# Patient Record
Sex: Male | Born: 1975
Health system: Southern US, Community
[De-identification: ages and names within clinical notes are randomized; demographics above are authoritative.]

## PROBLEM LIST (undated history)

## (undated) DIAGNOSIS — F329 Major depressive disorder, single episode, unspecified: Secondary | ICD-10-CM

## (undated) DIAGNOSIS — L4050 Arthropathic psoriasis, unspecified: Secondary | ICD-10-CM

## (undated) DIAGNOSIS — F909 Attention-deficit hyperactivity disorder, unspecified type: Secondary | ICD-10-CM

## (undated) DIAGNOSIS — S060X9A Concussion with loss of consciousness of unspecified duration, initial encounter: Secondary | ICD-10-CM

## (undated) DIAGNOSIS — F4310 Post-traumatic stress disorder, unspecified: Secondary | ICD-10-CM

## (undated) DIAGNOSIS — T671XXA Heat syncope, initial encounter: Secondary | ICD-10-CM

## (undated) DIAGNOSIS — F32A Depression, unspecified: Secondary | ICD-10-CM

## (undated) DIAGNOSIS — F319 Bipolar disorder, unspecified: Secondary | ICD-10-CM

## (undated) DIAGNOSIS — K219 Gastro-esophageal reflux disease without esophagitis: Secondary | ICD-10-CM

## (undated) HISTORY — DX: Major depressive disorder, single episode, unspecified: F32.9

## (undated) HISTORY — DX: Bipolar disorder, unspecified: F31.9

## (undated) HISTORY — DX: Post-traumatic stress disorder, unspecified: F43.10

## (undated) HISTORY — DX: Attention-deficit hyperactivity disorder, unspecified type: F90.9

## (undated) HISTORY — DX: Depression, unspecified: F32.A

## (undated) HISTORY — DX: Concussion with loss of consciousness of unspecified duration, initial encounter: S06.0X9A

## (undated) HISTORY — DX: Heat syncope, initial encounter: T67.1XXA

## (undated) HISTORY — DX: Gastro-esophageal reflux disease without esophagitis: K21.9

## (undated) HISTORY — DX: Arthropathic psoriasis, unspecified: L40.50

---
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
## 2003-09-26 ENCOUNTER — Ambulatory Visit (HOSPITAL_COMMUNITY): Admission: RE | Admit: 2003-09-26 | Discharge: 2003-09-26 | Payer: Self-pay | Admitting: Internal Medicine

## 2012-08-12 ENCOUNTER — Other Ambulatory Visit: Payer: Self-pay | Admitting: *Deleted

## 2012-08-12 ENCOUNTER — Other Ambulatory Visit: Payer: Self-pay | Admitting: Internal Medicine

## 2012-08-12 ENCOUNTER — Telehealth: Payer: Self-pay | Admitting: Internal Medicine

## 2012-08-12 ENCOUNTER — Other Ambulatory Visit (INDEPENDENT_AMBULATORY_CARE_PROVIDER_SITE_OTHER): Payer: Self-pay

## 2012-08-12 DIAGNOSIS — T887XXA Unspecified adverse effect of drug or medicament, initial encounter: Secondary | ICD-10-CM

## 2012-08-12 NOTE — Telephone Encounter (Signed)
Dr. Andee Poles requests liver function labs as part of medication monitoring for [facility]. Orders entered with result to be forwarded to Dr. Nolen Mu.

## 2012-08-15 ENCOUNTER — Encounter: Payer: Self-pay | Admitting: Internal Medicine

## 2012-09-07 ENCOUNTER — Other Ambulatory Visit (INDEPENDENT_AMBULATORY_CARE_PROVIDER_SITE_OTHER): Payer: Self-pay

## 2012-09-07 ENCOUNTER — Other Ambulatory Visit: Payer: Self-pay | Admitting: Internal Medicine

## 2012-09-07 DIAGNOSIS — T50995A Adverse effect of other drugs, medicaments and biological substances, initial encounter: Secondary | ICD-10-CM

## 2012-12-24 ENCOUNTER — Telehealth: Payer: Self-pay | Admitting: *Deleted

## 2012-12-24 ENCOUNTER — Other Ambulatory Visit: Payer: Self-pay | Admitting: *Deleted

## 2012-12-24 MED ORDER — ESOMEPRAZOLE MAGNESIUM 40 MG PO CPDR: 40.0000 mg | DELAYED_RELEASE_CAPSULE | Freq: Every day | ORAL | Status: AC

## 2012-12-24 NOTE — Telephone Encounter (Signed)
Pt's father requesting Rx for Nexium to give to pt for him to fill through mail order. Ok per Dr. Posey Rea. Signed Rx given to pt.

## 2013-01-11 ENCOUNTER — Other Ambulatory Visit: Payer: Self-pay | Admitting: Internal Medicine

## 2013-01-11 ENCOUNTER — Other Ambulatory Visit: Payer: Self-pay

## 2013-01-11 DIAGNOSIS — T887XXA Unspecified adverse effect of drug or medicament, initial encounter: Secondary | ICD-10-CM

## 2013-01-12 LAB — VALPROIC ACID LEVEL: Valproic Acid Lvl: 55.3 ug/mL (ref 50.0–100.0)

## 2013-01-17 ENCOUNTER — Encounter: Payer: Self-pay | Admitting: Internal Medicine

## 2014-01-16 ENCOUNTER — Encounter: Payer: Self-pay | Admitting: Internal Medicine

## 2015-07-29 HISTORY — PX: ANKLE SURGERY: SHX546

## 2015-09-25 ENCOUNTER — Other Ambulatory Visit (HOSPITAL_COMMUNITY): Payer: Self-pay | Admitting: Orthopedic Surgery

## 2015-09-25 DIAGNOSIS — M25571 Pain in right ankle and joints of right foot: Secondary | ICD-10-CM

## 2016-08-06 DIAGNOSIS — F3174 Bipolar disorder, in full remission, most recent episode manic: Secondary | ICD-10-CM | POA: Diagnosis not present

## 2016-08-06 DIAGNOSIS — F3176 Bipolar disorder, in full remission, most recent episode depressed: Secondary | ICD-10-CM | POA: Diagnosis not present

## 2016-09-10 DIAGNOSIS — F3176 Bipolar disorder, in full remission, most recent episode depressed: Secondary | ICD-10-CM | POA: Diagnosis not present

## 2016-09-10 DIAGNOSIS — F3174 Bipolar disorder, in full remission, most recent episode manic: Secondary | ICD-10-CM | POA: Diagnosis not present

## 2016-09-17 ENCOUNTER — Encounter: Payer: Self-pay | Admitting: Internal Medicine

## 2016-09-17 ENCOUNTER — Telehealth: Payer: Self-pay | Admitting: Emergency Medicine

## 2016-09-17 ENCOUNTER — Ambulatory Visit (INDEPENDENT_AMBULATORY_CARE_PROVIDER_SITE_OTHER)
Admission: RE | Admit: 2016-09-17 | Discharge: 2016-09-17 | Disposition: A | Discharge status: Home / Self Care | Payer: BLUE CROSS/BLUE SHIELD | Source: Ambulatory Visit | Attending: Internal Medicine | Admitting: Internal Medicine

## 2016-09-17 ENCOUNTER — Ambulatory Visit (INDEPENDENT_AMBULATORY_CARE_PROVIDER_SITE_OTHER): Payer: BLUE CROSS/BLUE SHIELD | Admitting: Internal Medicine

## 2016-09-17 VITALS — BP 156/92 | HR 98 | Temp 98.6°F | Resp 16 | Ht 69.0 in | Wt 181.5 lb

## 2016-09-17 DIAGNOSIS — K219 Gastro-esophageal reflux disease without esophagitis: Secondary | ICD-10-CM

## 2016-09-17 DIAGNOSIS — M542 Cervicalgia: Secondary | ICD-10-CM

## 2016-09-17 DIAGNOSIS — K222 Esophageal obstruction: Secondary | ICD-10-CM

## 2016-09-17 DIAGNOSIS — L4050 Arthropathic psoriasis, unspecified: Secondary | ICD-10-CM | POA: Diagnosis not present

## 2016-09-17 DIAGNOSIS — M545 Low back pain, unspecified: Secondary | ICD-10-CM | POA: Insufficient documentation

## 2016-09-17 DIAGNOSIS — M5440 Lumbago with sciatica, unspecified side: Secondary | ICD-10-CM

## 2016-09-17 DIAGNOSIS — L409 Psoriasis, unspecified: Secondary | ICD-10-CM

## 2016-09-17 MED ORDER — CELECOXIB 200 MG PO CAPS: 200.0000 mg | ORAL_CAPSULE | Freq: Two times a day (BID) | ORAL | 2 refills | Status: DC

## 2016-09-17 MED ORDER — CYCLOBENZAPRINE HCL 5 MG PO TABS: 5.0000 mg | ORAL_TABLET | Freq: Three times a day (TID) | ORAL | 1 refills | Status: DC | PRN

## 2016-09-17 MED ORDER — VITAMIN D 50 MCG (2000 UT) PO CAPS: ORAL_CAPSULE | ORAL | 3 refills | Status: AC

## 2016-09-17 NOTE — Assessment & Plan Note (Signed)
Scalp plaque

## 2016-09-17 NOTE — Assessment & Plan Note (Signed)
Recurrent sx's 

## 2016-09-17 NOTE — Progress Notes (Signed)
Pre-visit discussion using our clinic review tool. No additional management support is needed unless otherwise documented below in the visit note.  

## 2016-09-17 NOTE — Assessment & Plan Note (Addendum)
On Nexium F/u w/GI if needed Celebrex w/caution H pylori Ab

## 2016-09-17 NOTE — Telephone Encounter (Signed)
Ok to come today at 4:30 --appt made Thx

## 2016-09-17 NOTE — Assessment & Plan Note (Addendum)
Flexeril Celebrex w/caution Try TRE - Trauma Release Exercise  You can get a "Stress Less TRE" application X ray, labs Vit D

## 2016-09-17 NOTE — Telephone Encounter (Signed)
Pt called and wants to know if you will except him as a new patient. Please advise thanks.

## 2016-09-17 NOTE — Assessment & Plan Note (Signed)
MSK vs psoriatic arthritis Flexeril Celebrex w/caution Rice bag Try TRE - Trauma Release Exercise  You can get a "Stress Less TRE" application X ray Labs

## 2016-09-17 NOTE — Progress Notes (Signed)
Subjective:  Patient ID: Francisco Morgan, male    DOB: 07/25/1976  Age: 41 y.o. MRN: [id number]  CC: Annual Exam (Back pain, )   HPI Francisco Morgan presents for a severe LBP x 1 year off and on. On Sun - another attack has started. There is a lot of tension in the lower back and now has "spikes" in the neck x3 days. 2/10 at rest; 6/10 w/movement He took Celebrex today x1 - not better yet    Outpatient Medications Prior to Visit  Medication Sig Dispense Refill  . esomeprazole (NEXIUM) 40 MG capsule Take 1 capsule (40 mg total) by mouth daily before breakfast. 90 capsule 4   No facility-administered medications prior to visit.     ROS Review of Systems  Constitutional: Negative for appetite change, fatigue and unexpected weight change.  HENT: Negative for congestion, nosebleeds, sneezing, sore throat and trouble swallowing.   Eyes: Negative for itching and visual disturbance.  Respiratory: Negative for cough.   Cardiovascular: Negative for chest pain, palpitations and leg swelling.  Gastrointestinal: Negative for abdominal distention, blood in stool, diarrhea, nausea and vomiting.  Genitourinary: Negative for frequency and hematuria.  Musculoskeletal: Positive for arthralgias, back pain, joint swelling, neck pain and neck stiffness. Negative for gait problem.  Skin: Positive for rash.  Neurological: Negative for dizziness, tremors, speech difficulty and weakness.  Psychiatric/Behavioral: Positive for decreased concentration. Negative for agitation, dysphoric mood, sleep disturbance and suicidal ideas. The patient is not nervous/anxious.     Objective:  BP (!) 156/92   Pulse 98   Temp 98.6 F (37 C) (Oral)   Resp 16   Ht 5\' 9"  (1.753 m)   Wt 181 lb 8 oz (82.3 kg)   SpO2 99%   BMI 26.80 kg/m   BP Readings from Last 3 Encounters:  09/17/16 (!) 156/92    Wt Readings from Last 3 Encounters:  09/17/16 181 lb 8 oz (82.3 kg)    Physical Exam  Constitutional: He is  oriented to person, place, and time. He appears well-developed. No distress.  NAD  HENT:  Mouth/Throat: Oropharynx is clear and moist.  Eyes: Conjunctivae are normal. Pupils are equal, round, and reactive to light.  Neck: Normal range of motion. No JVD present. No thyromegaly present.  Cardiovascular: Normal rate, regular rhythm, normal heart sounds and intact distal pulses.  Exam reveals no gallop and no friction rub.   No murmur heard. Pulmonary/Chest: Effort normal and breath sounds normal. No respiratory distress. He has no wheezes. He has no rales. He exhibits no tenderness.  Abdominal: Soft. Bowel sounds are normal. He exhibits no distension and no mass. There is no tenderness. There is no rebound and no guarding.  Musculoskeletal: Normal range of motion. He exhibits tenderness. He exhibits no edema.  Lymphadenopathy:    He has no cervical adenopathy.  Neurological: He is alert and oriented to person, place, and time. He has normal reflexes. No cranial nerve deficit. He exhibits normal muscle tone. He displays a negative Romberg sign. Coordination and gait normal.  Skin: Skin is warm and dry. Rash noted.  Psychiatric: He has a normal mood and affect. His behavior is normal. Judgment and thought content normal.  very stiff LS spine w/decr ROM due to pain str leg elev (-) B very stiff cervical spine w/decr ROM due to pain Scalp w/psoriatic patches  Lab Results  Component Value Date   ALT 28 09/07/2012   AST 25 09/07/2012    No results  found.  Assessment & Plan:   There are no diagnoses linked to this encounter. I am having Mr. Latney maintain his esomeprazole, methylphenidate, CONCERTA, clonazePAM, divalproex, and lamoTRIgine.  Meds ordered this encounter  Medications  . methylphenidate (RITALIN) 10 MG tablet    Refill:  0  . CONCERTA 36 MG CR tablet    Refill:  0  . clonazePAM (KLONOPIN) 0.5 MG tablet    Refill:  2  . divalproex (DEPAKOTE) 500 MG DR tablet    Sig: Take  500 mg by mouth 3 (three) times daily.  Marland Kitchen lamoTRIgine (LAMICTAL) 200 MG tablet    Sig: Take 200 mg by mouth daily.     Follow-up: No Follow-up on file.  Walker Kehr, MD

## 2016-09-17 NOTE — Patient Instructions (Addendum)
Try TRE - Trauma Release Exercise  You can get a "Stress Less TRE" application Rice bag

## 2016-09-18 ENCOUNTER — Other Ambulatory Visit (INDEPENDENT_AMBULATORY_CARE_PROVIDER_SITE_OTHER): Payer: BLUE CROSS/BLUE SHIELD

## 2016-09-18 DIAGNOSIS — L409 Psoriasis, unspecified: Secondary | ICD-10-CM | POA: Diagnosis not present

## 2016-09-18 DIAGNOSIS — K219 Gastro-esophageal reflux disease without esophagitis: Secondary | ICD-10-CM | POA: Diagnosis not present

## 2016-09-18 DIAGNOSIS — K222 Esophageal obstruction: Secondary | ICD-10-CM

## 2016-09-18 DIAGNOSIS — M542 Cervicalgia: Secondary | ICD-10-CM | POA: Diagnosis not present

## 2016-09-18 DIAGNOSIS — L4050 Arthropathic psoriasis, unspecified: Secondary | ICD-10-CM

## 2016-09-18 LAB — VITAMIN B12: Vitamin B-12: 296 pg/mL (ref 211–911)

## 2016-09-18 LAB — CBC WITH DIFFERENTIAL/PLATELET
BASOS ABS: 0 10*3/uL (ref 0.0–0.1)
Basophils Relative: 0.4 % (ref 0.0–3.0)
EOS ABS: 0.1 10*3/uL (ref 0.0–0.7)
Eosinophils Relative: 0.9 % (ref 0.0–5.0)
HEMATOCRIT: 43.1 % (ref 39.0–52.0)
HEMOGLOBIN: 15.1 g/dL (ref 13.0–17.0)
Lymphocytes Relative: 34 % (ref 12.0–46.0)
Lymphs Abs: 2.3 10*3/uL (ref 0.7–4.0)
MCHC: 35.2 g/dL (ref 30.0–36.0)
MCV: 92.8 fl (ref 78.0–100.0)
Monocytes Absolute: 0.4 10*3/uL (ref 0.1–1.0)
Monocytes Relative: 5.5 % (ref 3.0–12.0)
Neutro Abs: 4 10*3/uL (ref 1.4–7.7)
Neutrophils Relative %: 59.2 % (ref 43.0–77.0)
Platelets: 239 10*3/uL (ref 150.0–400.0)
RBC: 4.64 Mil/uL (ref 4.22–5.81)
RDW: 12.6 % (ref 11.5–15.5)
WBC: 6.7 10*3/uL (ref 4.0–10.5)

## 2016-09-18 LAB — URINALYSIS
HGB URINE DIPSTICK: NEGATIVE
Ketones, ur: 40 — AB
Leukocytes, UA: NEGATIVE
NITRITE: NEGATIVE
PH: 7 (ref 5.0–8.0)
Specific Gravity, Urine: 1.02 (ref 1.000–1.030)
Urine Glucose: NEGATIVE
Urobilinogen, UA: 1 (ref 0.0–1.0)

## 2016-09-18 LAB — HEPATIC FUNCTION PANEL
ALK PHOS: 50 U/L (ref 39–117)
ALT: 27 U/L (ref 0–53)
AST: 27 U/L (ref 0–37)
Albumin: 4.8 g/dL (ref 3.5–5.2)
BILIRUBIN DIRECT: 0.2 mg/dL (ref 0.0–0.3)
Total Bilirubin: 0.9 mg/dL (ref 0.2–1.2)
Total Protein: 7.2 g/dL (ref 6.0–8.3)

## 2016-09-18 LAB — SEDIMENTATION RATE: Sed Rate: 1 mm/hr (ref 0–15)

## 2016-09-18 LAB — BASIC METABOLIC PANEL
BUN: 8 mg/dL (ref 6–23)
CALCIUM: 9.5 mg/dL (ref 8.4–10.5)
CHLORIDE: 101 meq/L (ref 96–112)
CO2: 29 mEq/L (ref 19–32)
CREATININE: 0.8 mg/dL (ref 0.40–1.50)
GFR: 113.94 mL/min (ref >60.00)
Glucose, Bld: 98 mg/dL (ref 70–99)
Potassium: 4.1 mEq/L (ref 3.5–5.1)
Sodium: 140 mEq/L (ref 135–145)

## 2016-09-18 LAB — TSH: TSH: 1.31 u[IU]/mL (ref 0.35–4.50)

## 2016-09-18 LAB — H. PYLORI ANTIBODY, IGG: H Pylori IgG: NEGATIVE

## 2016-09-18 LAB — VITAMIN D 25 HYDROXY (VIT D DEFICIENCY, FRACTURES): VITD: 12.94 ng/mL — AB (ref 30.00–100.00)

## 2016-09-19 ENCOUNTER — Encounter: Payer: Self-pay | Admitting: Internal Medicine

## 2016-09-19 LAB — RHEUMATOID FACTOR: Rhuematoid fact SerPl-aCnc: <14 IU/mL (ref <14)

## 2016-09-19 LAB — ANA: ANA: NEGATIVE

## 2016-09-21 MED ORDER — VITAMIN D3 1.25 MG (50000 UT) PO CAPS: 1.0000 | ORAL_CAPSULE | ORAL | 0 refills | Status: DC

## 2016-09-21 MED ORDER — B COMPLEX PO TABS: 1.0000 | ORAL_TABLET | Freq: Every day | ORAL | 3 refills | Status: AC

## 2016-09-24 ENCOUNTER — Telehealth: Payer: Self-pay | Admitting: Internal Medicine

## 2016-09-24 DIAGNOSIS — L400 Psoriasis vulgaris: Secondary | ICD-10-CM

## 2016-09-24 DIAGNOSIS — M130 Polyarthritis, unspecified: Secondary | ICD-10-CM

## 2016-09-24 DIAGNOSIS — L409 Psoriasis, unspecified: Secondary | ICD-10-CM

## 2016-09-24 NOTE — Telephone Encounter (Signed)
Francisco Morgan, Pls call lab re: I'm still waiting on one more test that I ordered (HLAB-27) report - is it being processed? Add a gout test (uric acid) please too.  I'll ref the pt to see a Rheumatologist. Thx AP

## 2016-09-25 NOTE — Telephone Encounter (Signed)
The HLAB-27 was never collected and it is too late to add on the Uric Acid.  REordered HLAB-27.   LVM for pt to call back as soon as possible.  RE: Pt needs to go down stairs at earliest convenience.

## 2016-10-06 DIAGNOSIS — B359 Dermatophytosis, unspecified: Secondary | ICD-10-CM | POA: Diagnosis not present

## 2016-10-06 DIAGNOSIS — L409 Psoriasis, unspecified: Secondary | ICD-10-CM | POA: Diagnosis not present

## 2016-10-06 DIAGNOSIS — L219 Seborrheic dermatitis, unspecified: Secondary | ICD-10-CM | POA: Diagnosis not present

## 2016-10-07 ENCOUNTER — Encounter: Payer: Self-pay | Admitting: Internal Medicine

## 2016-10-16 ENCOUNTER — Encounter: Payer: Self-pay | Admitting: Internal Medicine

## 2016-10-16 ENCOUNTER — Ambulatory Visit (INDEPENDENT_AMBULATORY_CARE_PROVIDER_SITE_OTHER): Payer: BLUE CROSS/BLUE SHIELD | Admitting: Internal Medicine

## 2016-10-16 ENCOUNTER — Other Ambulatory Visit (INDEPENDENT_AMBULATORY_CARE_PROVIDER_SITE_OTHER): Payer: BLUE CROSS/BLUE SHIELD

## 2016-10-16 DIAGNOSIS — M5440 Lumbago with sciatica, unspecified side: Secondary | ICD-10-CM | POA: Diagnosis not present

## 2016-10-16 DIAGNOSIS — K219 Gastro-esophageal reflux disease without esophagitis: Secondary | ICD-10-CM

## 2016-10-16 DIAGNOSIS — L409 Psoriasis, unspecified: Secondary | ICD-10-CM | POA: Diagnosis not present

## 2016-10-16 DIAGNOSIS — M130 Polyarthritis, unspecified: Secondary | ICD-10-CM

## 2016-10-16 DIAGNOSIS — K921 Melena: Secondary | ICD-10-CM | POA: Diagnosis not present

## 2016-10-16 DIAGNOSIS — L400 Psoriasis vulgaris: Secondary | ICD-10-CM

## 2016-10-16 DIAGNOSIS — K222 Esophageal obstruction: Secondary | ICD-10-CM

## 2016-10-16 LAB — CBC WITH DIFFERENTIAL/PLATELET
BASOS PCT: 0.5 % (ref 0.0–3.0)
Basophils Absolute: 0 10*3/uL (ref 0.0–0.1)
EOS ABS: 0.1 10*3/uL (ref 0.0–0.7)
Eosinophils Relative: 2.5 % (ref 0.0–5.0)
HEMATOCRIT: 44.3 % (ref 39.0–52.0)
Hemoglobin: 15.5 g/dL (ref 13.0–17.0)
LYMPHS ABS: 2.1 10*3/uL (ref 0.7–4.0)
Lymphocytes Relative: 39.7 % (ref 12.0–46.0)
MCHC: 35 g/dL (ref 30.0–36.0)
MCV: 92.4 fl (ref 78.0–100.0)
MONOS PCT: 8.4 % (ref 3.0–12.0)
Monocytes Absolute: 0.5 10*3/uL (ref 0.1–1.0)
NEUTROS ABS: 2.6 10*3/uL (ref 1.4–7.7)
NEUTROS PCT: 48.9 % (ref 43.0–77.0)
PLATELETS: 268 10*3/uL (ref 150.0–400.0)
RBC: 4.8 Mil/uL (ref 4.22–5.81)
RDW: 12.9 % (ref 11.5–15.5)
WBC: 5.4 10*3/uL (ref 4.0–10.5)

## 2016-10-16 LAB — URIC ACID: Uric Acid, Serum: 9.7 mg/dL — ABNORMAL HIGH (ref 4.0–7.8)

## 2016-10-16 NOTE — Progress Notes (Signed)
Pre-visit discussion using our clinic review tool. No additional management support is needed unless otherwise documented below in the visit note.  

## 2016-10-16 NOTE — Assessment & Plan Note (Signed)
On Nexium 

## 2016-10-16 NOTE — Assessment & Plan Note (Signed)
seems to be related to Celebrex Rx - hold GI ref

## 2016-10-16 NOTE — Assessment & Plan Note (Addendum)
Rheum appt next week

## 2016-10-16 NOTE — Progress Notes (Signed)
Subjective:  Patient ID: Francisco Morgan, male    DOB: 18-Sep-1975  Age: 41 y.o. MRN: [id number]  CC: Back Pain (has plans to see RA provider ); Neck Pain (seen Dermatologist ); and Blood In Stools   HPI Francisco Morgan presents for arthritis sx's f/u - better C/o bloody stools on Celebrex - 2 teaspoons per stool -- direct correlation w/Celebrex intake. No abd pain. No n/v   Outpatient Medications Prior to Visit  Medication Sig Dispense Refill  . b complex vitamins tablet Take 1 tablet by mouth daily. 100 tablet 3  . celecoxib (CELEBREX) 200 MG capsule Take 1 capsule (200 mg total) by mouth 2 (two) times daily. 60 capsule 2  . Cholecalciferol (VITAMIN D) 2000 units CAPS 1 daily 100 capsule 3  . Cholecalciferol (VITAMIN D3) 50000 units CAPS Take 1 capsule by mouth once a week. 8 capsule 0  . clonazePAM (KLONOPIN) 0.5 MG tablet   2  . CONCERTA 36 MG CR tablet   0  . cyclobenzaprine (FLEXERIL) 5 MG tablet Take 1 tablet (5 mg total) by mouth 3 (three) times daily as needed for muscle spasms. 30 tablet 1  . divalproex (DEPAKOTE) 500 MG DR tablet Take 500 mg by mouth 3 (three) times daily.    Marland Kitchen esomeprazole (NEXIUM) 40 MG capsule Take 1 capsule (40 mg total) by mouth daily before breakfast. 90 capsule 4  . lamoTRIgine (LAMICTAL) 200 MG tablet Take 200 mg by mouth daily.    . methylphenidate (RITALIN) 10 MG tablet   0   No facility-administered medications prior to visit.     ROS Review of Systems  Constitutional: Negative for appetite change, fatigue and unexpected weight change.  HENT: Negative for congestion, nosebleeds, sneezing, sore throat and trouble swallowing.   Eyes: Negative for itching and visual disturbance.  Respiratory: Negative for cough.   Cardiovascular: Negative for chest pain, palpitations and leg swelling.  Gastrointestinal: Positive for blood in stool. Negative for abdominal distention, diarrhea and nausea.  Genitourinary: Negative for frequency and hematuria.    Musculoskeletal: Positive for arthralgias. Negative for back pain, gait problem, joint swelling and neck pain.  Skin: Negative for rash.  Neurological: Negative for dizziness, tremors, speech difficulty and weakness.  Psychiatric/Behavioral: Negative for agitation, dysphoric mood and sleep disturbance. The patient is not nervous/anxious.   bloody nose d//c  Objective:  BP 130/86   Pulse 95   Temp 98.4 F (36.9 C) (Oral)   Resp 16   Ht 5\' 9"  (1.753 m)   Wt 174 lb 12 oz (79.3 kg)   SpO2 100%   BMI 25.81 kg/m   BP Readings from Last 3 Encounters:  10/16/16 130/86  09/17/16 (!) 156/92    Wt Readings from Last 3 Encounters:  10/16/16 174 lb 12 oz (79.3 kg)  09/17/16 181 lb 8 oz (82.3 kg)    Physical Exam  Constitutional: He is oriented to person, place, and time. He appears well-developed. No distress.  NAD  HENT:  Mouth/Throat: Oropharynx is clear and moist.  Eyes: Conjunctivae are normal. Pupils are equal, round, and reactive to light.  Neck: Normal range of motion. No JVD present. No thyromegaly present.  Cardiovascular: Normal rate, regular rhythm, normal heart sounds and intact distal pulses.  Exam reveals no gallop and no friction rub.   No murmur heard. Pulmonary/Chest: Effort normal and breath sounds normal. No respiratory distress. He has no wheezes. He has no rales. He exhibits no tenderness.  Abdominal: Soft. Bowel sounds  are normal. He exhibits no distension and no mass. There is no tenderness. There is no rebound and no guarding.  Musculoskeletal: Normal range of motion. He exhibits tenderness. He exhibits no edema.  Lymphadenopathy:    He has no cervical adenopathy.  Neurological: He is alert and oriented to person, place, and time. He has normal reflexes. No cranial nerve deficit. He exhibits normal muscle tone. He displays a negative Romberg sign. Coordination and gait normal.  Skin: Skin is warm and dry. No rash noted.  Psychiatric: He has a normal mood and  affect. His behavior is normal. Judgment and thought content normal.    Lab Results  Component Value Date   WBC 6.7 09/18/2016   HGB 15.1 09/18/2016   HCT 43.1 09/18/2016   PLT 239.0 09/18/2016   GLUCOSE 98 09/18/2016   ALT 27 09/18/2016   AST 27 09/18/2016   NA 140 09/18/2016   K 4.1 09/18/2016   CL 101 09/18/2016   CREATININE 0.80 09/18/2016   BUN 8 09/18/2016   CO2 29 09/18/2016   TSH 1.31 09/18/2016    Dg Cervical Spine Complete  Result Date: 09/18/2016 CLINICAL DATA:  Cervicalgia radiating into shoulders EXAM: CERVICAL SPINE - COMPLETE 4+ VIEW COMPARISON:  None. FINDINGS: Frontal, lateral, open-mouth odontoid, and bilateral oblique views were obtained. There is no fracture or spondylolisthesis. Prevertebral soft tissues and predental space regions are normal. There is slight disc space narrowing at C5-6. The disc spaces appear normal. There is no appreciable exit foraminal narrowing on the oblique views. IMPRESSION: Slight disc space narrowing C5-6. Other disc spaces appear unremarkable. No fracture or spondylolisthesis. Electronically Signed   By: Lowella Grip III M.D.   On: 09/18/2016 08:09   Dg Lumbar Spine 2-3 Views  Result Date: 09/18/2016 CLINICAL DATA:  Low back pain for 3 days, no known injury EXAM: LUMBAR SPINE - 2-3 VIEW COMPARISON:  None. FINDINGS: Three views of the lumbar spine submitted. No acute fracture or subluxation. Probable Schmorl's node deformity noted lower endplate of L3 vertebral body. Mild disc space flattening at L3-L4 level. Mild to moderate disc space flattening at L5-S1 level. Alignment and vertebral body heights are preserved. IMPRESSION: No acute fracture or subluxation. Mild degenerative changes as described above. Electronically Signed   By: Lahoma Crocker M.D.   On: 09/18/2016 08:09    Assessment & Plan:   There are no diagnoses linked to this encounter. I am having Mr. Dunton maintain his esomeprazole, methylphenidate, CONCERTA, clonazePAM,  divalproex, lamoTRIgine, celecoxib, cyclobenzaprine, Vitamin D, Vitamin D3, b complex vitamins, and calcipotriene.  Meds ordered this encounter  Medications  . calcipotriene (DOVONOX) 0.005 % cream    Refill:  0     Follow-up: No Follow-up on file.  Walker Kehr, MD

## 2016-10-22 DIAGNOSIS — M2550 Pain in unspecified joint: Secondary | ICD-10-CM | POA: Diagnosis not present

## 2016-10-22 DIAGNOSIS — L400 Psoriasis vulgaris: Secondary | ICD-10-CM | POA: Diagnosis not present

## 2016-10-22 LAB — HLA-B27 ANTIGEN: DNA Result:: POSITIVE — AB

## 2016-12-12 ENCOUNTER — Encounter: Payer: Self-pay | Admitting: Internal Medicine

## 2016-12-30 ENCOUNTER — Encounter: Payer: Self-pay | Admitting: Internal Medicine

## 2016-12-31 ENCOUNTER — Encounter (INDEPENDENT_AMBULATORY_CARE_PROVIDER_SITE_OTHER): Payer: Self-pay | Admitting: Orthopedic Surgery

## 2016-12-31 ENCOUNTER — Ambulatory Visit (INDEPENDENT_AMBULATORY_CARE_PROVIDER_SITE_OTHER): Payer: BLUE CROSS/BLUE SHIELD | Admitting: Orthopedic Surgery

## 2016-12-31 DIAGNOSIS — M25571 Pain in right ankle and joints of right foot: Secondary | ICD-10-CM | POA: Insufficient documentation

## 2016-12-31 MED ORDER — DICLOFENAC SODIUM 1 % TD GEL: 2.0000 g | Freq: Four times a day (QID) | TRANSDERMAL | 3 refills | Status: DC | PRN

## 2016-12-31 NOTE — Addendum Note (Signed)
Addended by: Meridee Score on: 12/31/2016 01:42 PM   Modules accepted: Orders

## 2016-12-31 NOTE — Progress Notes (Addendum)
Office Visit Note   Patient: Francisco Morgan           Date of Birth: March 07, 1976           MRN: [id number] Visit Date: 12/31/2016              Requested by: [name], MD [place], [place] [id number] PCP: [name], MD  Chief Complaint  Patient presents with  . Right Ankle - Follow-up    12/26/15 Tarsal Tunnel Release      HPI: Patient is status post tarsal tunnel release last year. Patient states that the numbness and dead sensation on the plantar aspect of his foot is completely resolved. Patient states that after he has been up on his foot for a period of time he noticed some tightness over the area of the surgical incision. He states he does use a cane after ambulating for more than 4 hours at a time. Patient states he no longer has any popping sensation.  Assessment & Plan: Visit Diagnoses:  1. Pain in right ankle and joints of right foot     Plan: A prescription for Voltaren gel will be called in. He will use this 2-3 times a day. Discussed that if this does not help he should call us after several weeks and we could proceed with either nitroglycerin patch or oral prednisone. Patient has no neuropathic pain at this time no signs of infection no signs of tendon dysfunction.  Follow-Up Instructions: Return if symptoms worsen or fail to improve.   Ortho Exam  Patient is alert, oriented, no adenopathy, well-dressed, normal affect, normal respiratory effort. Examination patient has a normal gait. He can do a single limb heel raise he has a good arch good varus of the hindfoot with single limb heel raise and has no pain with this activity. He has a good dorsalis pedis and posterior tibial pulse. He has good plantar and dorsal sensation of the foot with no sensory deficits that he had previously. The surgical incision is well-healed. There is no swelling of the posterior tibial tendon. Patient has a negative Tinel's sign. There is no  swelling.  Imaging: No results found.  Labs: Lab Results  Component Value Date   ESRSEDRATE 1 09/18/2016   LABURIC 9.7 (H) 10/16/2016    Orders:  No orders of the defined types were placed in this encounter.  Meds ordered this encounter  Medications  . diclofenac sodium (VOLTAREN) 1 % GEL    Sig: Apply 2 g topically 4 (four) times daily as needed.    Dispense:  100 g    Refill:  3     Procedures: No procedures performed  Clinical Data: No additional findings.  ROS:  All other systems negative, except as noted in the HPI. Review of Systems  Objective: Vital Signs: There were no vitals taken for this visit.  Specialty Comments:  No specialty comments available.  PMFS History: Patient Active Problem List   Diagnosis Date Noted  . Pain in right ankle and joints of right foot 12/31/2016  . Hematochezia 10/16/2016  . Low back pain 09/17/2016  . Neck pain 09/17/2016  . Psoriasis 09/17/2016  . Psoriatic arthritis ([place]) 09/17/2016  . GERD with stricture 09/17/2016   Past Medical History:  Diagnosis Date  . ADHD    Dr. Toy Care  . Arthritis    ?psoriatic  . Depression    Dr. Toy Care - bipolar depression w/rapid cycling   .  GERD (gastroesophageal reflux disease)    stricture 2005  . PTSD (post-traumatic stress disorder)    Dr. Toy Care  . Rapid cycling bipolar disorder ([place])    Dr. Toy Care    Family History  Problem Relation Age of Onset  . Hyperlipidemia Father   . Arthritis Maternal Grandmother   . Heart attack Maternal Grandfather   . Hyperlipidemia Maternal Grandfather   . Alcohol abuse Paternal Grandmother   . Mental illness Paternal Grandmother   . Colon cancer Paternal Grandfather     Past Surgical History:  Procedure Laterality Date  . ANKLE SURGERY Right 2017   Tarsal Tunnel  . FOREARM / WRIST TUMOR EXCISION  1994   Myxoma   Social History   Occupational History  . Realtor    Social History Main Topics  . Smoking status: Former Smoker     Packs/day: 1.00    Years: 7.00    Types: Cigarettes  . Smokeless tobacco: Never Used  . Alcohol use 4.2 oz/week    7 Shots of liquor per week  . Drug use: No  . Sexual activity: Yes

## 2017-01-06 ENCOUNTER — Telehealth (INDEPENDENT_AMBULATORY_CARE_PROVIDER_SITE_OTHER): Payer: Self-pay

## 2017-01-06 ENCOUNTER — Other Ambulatory Visit (INDEPENDENT_AMBULATORY_CARE_PROVIDER_SITE_OTHER): Payer: Self-pay

## 2017-01-06 ENCOUNTER — Telehealth (INDEPENDENT_AMBULATORY_CARE_PROVIDER_SITE_OTHER): Payer: Self-pay | Admitting: Orthopedic Surgery

## 2017-01-06 MED ORDER — DICLOFENAC SODIUM 1 % TD GEL: 2.0000 g | Freq: Four times a day (QID) | TRANSDERMAL | 1 refills | Status: DC

## 2017-01-06 NOTE — Telephone Encounter (Signed)
[facility] fax number [phone] sent rx and demo information fo diclofenac gel rx will process and mail directly to the pt for a 10$ fee. I called pt to advise and he voiced understanding and will call with questions.

## 2017-01-06 NOTE — Telephone Encounter (Signed)
Pharmacy advised that prior auth needed  Does not meet the criteria under cover my meds. Will try and send to [facility] for mail order 10$ rx

## 2017-01-06 NOTE — Telephone Encounter (Signed)
I called and was advised that the coupon could only be used for the 2% gel and advised that this is ok to change with same sig per MD.

## 2017-01-06 NOTE — Telephone Encounter (Signed)
Angel from [facility] called saying that the last RX sent over for the patient could actually be sent to his pharmacy in [place]. CB # [phone]

## 2017-03-04 DIAGNOSIS — F3111 Bipolar disorder, current episode manic without psychotic features, mild: Secondary | ICD-10-CM | POA: Diagnosis not present

## 2017-03-04 DIAGNOSIS — F3176 Bipolar disorder, in full remission, most recent episode depressed: Secondary | ICD-10-CM | POA: Diagnosis not present

## 2017-03-04 DIAGNOSIS — F900 Attention-deficit hyperactivity disorder, predominantly inattentive type: Secondary | ICD-10-CM | POA: Diagnosis not present

## 2017-05-04 ENCOUNTER — Ambulatory Visit (INDEPENDENT_AMBULATORY_CARE_PROVIDER_SITE_OTHER): Payer: BLUE CROSS/BLUE SHIELD | Admitting: Nurse Practitioner

## 2017-05-04 ENCOUNTER — Encounter: Payer: Self-pay | Admitting: Nurse Practitioner

## 2017-05-04 VITALS — BP 132/80 | HR 78 | Temp 98.4°F | Ht 69.0 in | Wt 174.0 lb

## 2017-05-04 DIAGNOSIS — S91331A Puncture wound without foreign body, right foot, initial encounter: Secondary | ICD-10-CM

## 2017-05-04 DIAGNOSIS — Z23 Encounter for immunization: Secondary | ICD-10-CM | POA: Diagnosis not present

## 2017-05-04 NOTE — Patient Instructions (Signed)
Apply triple antibiotic ointment BID x 3days then stop.  Return to office if any signs of infection.

## 2017-05-04 NOTE — Progress Notes (Signed)
Subjective:  Patient ID: Francisco Morgan, male    DOB: 1976-01-16  Age: 41 y.o. MRN: [id number]  CC: Foot Injury (step on rusty nail on right foot yesterday. painful/ tdap/)   Wound Check  He was originally treated yesterday. Previous treatment included wound cleansing or irrigation. His temperature was unmeasured prior to arrival. There has been bloody discharge from the wound. There is no redness present. There is no swelling present. There is no pain present. He has no difficulty moving the affected extremity or digit.  puncture wound from rusty nail at his job. Unknown last Tetanus.  Outpatient Medications Prior to Visit  Medication Sig Dispense Refill  . b complex vitamins tablet Take 1 tablet by mouth daily. 100 tablet 3  . calcipotriene (DOVONOX) 0.005 % cream   0  . celecoxib (CELEBREX) 200 MG capsule Take 1 capsule (200 mg total) by mouth 2 (two) times daily. 60 capsule 2  . Cholecalciferol (VITAMIN D) 2000 units CAPS 1 daily 100 capsule 3  . clonazePAM (KLONOPIN) 0.5 MG tablet   2  . CONCERTA 36 MG CR tablet   0  . cyclobenzaprine (FLEXERIL) 5 MG tablet Take 1 tablet (5 mg total) by mouth 3 (three) times daily as needed for muscle spasms. 30 tablet 1  . diclofenac sodium (VOLTAREN) 1 % GEL Apply 2-4 g topically 4 (four) times daily. 100 g 1  . divalproex (DEPAKOTE) 500 MG DR tablet Take 500 mg by mouth 3 (three) times daily.    Marland Kitchen esomeprazole (NEXIUM) 40 MG capsule Take 1 capsule (40 mg total) by mouth daily before breakfast. 90 capsule 4  . lamoTRIgine (LAMICTAL) 200 MG tablet Take 200 mg by mouth daily.    . methylphenidate (RITALIN) 10 MG tablet   0  . Cholecalciferol (VITAMIN D3) 50000 units CAPS Take 1 capsule by mouth once a week. (Patient not taking: Reported on 05/04/2017) 8 capsule 0   No facility-administered medications prior to visit.     ROS See HPI  Objective:  BP 132/80   Pulse 78   Temp 98.4 F (36.9 C)   Ht 5\' 9"  (1.753 m)   Wt 174 lb (78.9 kg)    SpO2 98%   BMI 25.70 kg/m   BP Readings from Last 3 Encounters:  05/04/17 132/80  10/16/16 130/86  09/17/16 (!) 156/92    Wt Readings from Last 3 Encounters:  05/04/17 174 lb (78.9 kg)  10/16/16 174 lb 12 oz (79.3 kg)  09/17/16 181 lb 8 oz (82.3 kg)    Physical Exam  Musculoskeletal:       Right foot: Normal.       Feet:  Skin: Skin is warm and dry.    Lab Results  Component Value Date   WBC 5.4 10/16/2016   HGB 15.5 10/16/2016   HCT 44.3 10/16/2016   PLT 268.0 10/16/2016   GLUCOSE 98 09/18/2016   ALT 27 09/18/2016   AST 27 09/18/2016   NA 140 09/18/2016   K 4.1 09/18/2016   CL 101 09/18/2016   CREATININE 0.80 09/18/2016   BUN 8 09/18/2016   CO2 29 09/18/2016   TSH 1.31 09/18/2016    Dg Cervical Spine Complete  Result Date: 09/18/2016 CLINICAL DATA:  Cervicalgia radiating into shoulders EXAM: CERVICAL SPINE - COMPLETE 4+ VIEW COMPARISON:  None. FINDINGS: Frontal, lateral, open-mouth odontoid, and bilateral oblique views were obtained. There is no fracture or spondylolisthesis. Prevertebral soft tissues and predental space regions are normal. There is slight  disc space narrowing at C5-6. The disc spaces appear normal. There is no appreciable exit foraminal narrowing on the oblique views. IMPRESSION: Slight disc space narrowing C5-6. Other disc spaces appear unremarkable. No fracture or spondylolisthesis. Electronically Signed   By: Lowella Grip III M.D.   On: 09/18/2016 08:09   Dg Lumbar Spine 2-3 Views  Result Date: 09/18/2016 CLINICAL DATA:  Low back pain for 3 days, no known injury EXAM: LUMBAR SPINE - 2-3 VIEW COMPARISON:  None. FINDINGS: Three views of the lumbar spine submitted. No acute fracture or subluxation. Probable Schmorl's node deformity noted lower endplate of L3 vertebral body. Mild disc space flattening at L3-L4 level. Mild to moderate disc space flattening at L5-S1 level. Alignment and vertebral body heights are preserved. IMPRESSION: No acute  fracture or subluxation. Mild degenerative changes as described above. Electronically Signed   By: Lahoma Crocker M.D.   On: 09/18/2016 08:09    Assessment & Plan:   Francisco Morgan was seen today for foot injury.  Diagnoses and all orders for this visit:  Puncture wound of foot excluding toes without complication, right, initial encounter  Need for diphtheria-tetanus-pertussis (Tdap) vaccine -     Tdap vaccine greater than or equal to 7yo IM   I am having Francisco Morgan maintain his esomeprazole, methylphenidate, CONCERTA, clonazePAM, divalproex, lamoTRIgine, celecoxib, cyclobenzaprine, Vitamin D, Vitamin D3, b complex vitamins, calcipotriene, and diclofenac sodium.  No orders of the defined types were placed in this encounter.   Follow-up: Return if symptoms worsen or fail to improve.  Wilfred Lacy, NP

## 2017-05-26 ENCOUNTER — Encounter: Payer: Self-pay | Admitting: Internal Medicine

## 2017-05-26 ENCOUNTER — Ambulatory Visit (INDEPENDENT_AMBULATORY_CARE_PROVIDER_SITE_OTHER): Payer: BLUE CROSS/BLUE SHIELD | Admitting: Internal Medicine

## 2017-05-26 DIAGNOSIS — L4050 Arthropathic psoriasis, unspecified: Secondary | ICD-10-CM

## 2017-05-26 DIAGNOSIS — M5440 Lumbago with sciatica, unspecified side: Secondary | ICD-10-CM

## 2017-05-26 DIAGNOSIS — E559 Vitamin D deficiency, unspecified: Secondary | ICD-10-CM | POA: Diagnosis not present

## 2017-05-26 DIAGNOSIS — K222 Esophageal obstruction: Secondary | ICD-10-CM | POA: Diagnosis not present

## 2017-05-26 DIAGNOSIS — K219 Gastro-esophageal reflux disease without esophagitis: Secondary | ICD-10-CM

## 2017-05-26 DIAGNOSIS — K921 Melena: Secondary | ICD-10-CM

## 2017-05-26 DIAGNOSIS — L409 Psoriasis, unspecified: Secondary | ICD-10-CM | POA: Diagnosis not present

## 2017-05-26 MED ORDER — CELECOXIB 200 MG PO CAPS: 200.0000 mg | ORAL_CAPSULE | Freq: Two times a day (BID) | ORAL | 2 refills | Status: DC

## 2017-05-26 MED ORDER — VITAMIN D3 1.25 MG (50000 UT) PO CAPS: 1.0000 | ORAL_CAPSULE | ORAL | 0 refills | Status: DC

## 2017-05-26 NOTE — Assessment & Plan Note (Signed)
Better  Celebrex prn Vi D Flexeril prn

## 2017-05-26 NOTE — Assessment & Plan Note (Signed)
Celebrex prn 

## 2017-05-26 NOTE — Progress Notes (Signed)
Subjective:  Patient ID: Francisco Morgan, male    DOB: 18-Oct-1975  Age: 41 y.o. MRN: [id number]  CC: No chief complaint on file.   HPI Francisco Morgan presents for psoriatic arthritis, psoriasis. Pain is better. No blood in stool F/u GERD  Outpatient Medications Prior to Visit  Medication Sig Dispense Refill  . b complex vitamins tablet Take 1 tablet by mouth daily. 100 tablet 3  . calcipotriene (DOVONOX) 0.005 % cream   0  . celecoxib (CELEBREX) 200 MG capsule Take 1 capsule (200 mg total) by mouth 2 (two) times daily. 60 capsule 2  . Cholecalciferol (VITAMIN D) 2000 units CAPS 1 daily 100 capsule 3  . Cholecalciferol (VITAMIN D3) 50000 units CAPS Take 1 capsule by mouth once a week. (Patient not taking: Reported on 05/04/2017) 8 capsule 0  . clonazePAM (KLONOPIN) 0.5 MG tablet   2  . CONCERTA 36 MG CR tablet   0  . cyclobenzaprine (FLEXERIL) 5 MG tablet Take 1 tablet (5 mg total) by mouth 3 (three) times daily as needed for muscle spasms. 30 tablet 1  . diclofenac sodium (VOLTAREN) 1 % GEL Apply 2-4 g topically 4 (four) times daily. 100 g 1  . divalproex (DEPAKOTE) 500 MG DR tablet Take 500 mg by mouth 3 (three) times daily.    Marland Kitchen esomeprazole (NEXIUM) 40 MG capsule Take 1 capsule (40 mg total) by mouth daily before breakfast. 90 capsule 4  . lamoTRIgine (LAMICTAL) 200 MG tablet Take 200 mg by mouth daily.    . methylphenidate (RITALIN) 10 MG tablet   0   No facility-administered medications prior to visit.     ROS Review of Systems  Constitutional: Negative for appetite change, fatigue and unexpected weight change.  HENT: Negative for congestion, nosebleeds, sneezing, sore throat and trouble swallowing.   Eyes: Negative for itching and visual disturbance.  Respiratory: Negative for cough.   Cardiovascular: Negative for chest pain, palpitations and leg swelling.  Gastrointestinal: Negative for abdominal distention, blood in stool, diarrhea and nausea.  Genitourinary:  Negative for frequency and hematuria.  Musculoskeletal: Positive for arthralgias and back pain. Negative for gait problem, joint swelling and neck pain.  Skin: Positive for rash.  Neurological: Negative for dizziness, tremors, speech difficulty and weakness.  Psychiatric/Behavioral: Negative for agitation, dysphoric mood and sleep disturbance. The patient is not nervous/anxious.     Objective:  BP 126/84 (BP Location: Left Arm, Patient Position: Sitting, Cuff Size: Normal)   Pulse 94   Temp 98.7 F (37.1 C) (Oral)   Ht 5\' 9"  (1.753 m)   Wt 175 lb (79.4 kg)   SpO2 98%   BMI 25.84 kg/m   BP Readings from Last 3 Encounters:  05/26/17 126/84  05/04/17 132/80  10/16/16 130/86    Wt Readings from Last 3 Encounters:  05/26/17 175 lb (79.4 kg)  05/04/17 174 lb (78.9 kg)  10/16/16 174 lb 12 oz (79.3 kg)    Physical Exam  Constitutional: He is oriented to person, place, and time. He appears well-developed. No distress.  NAD  HENT:  Mouth/Throat: Oropharynx is clear and moist.  Eyes: Pupils are equal, round, and reactive to light. Conjunctivae are normal.  Neck: Normal range of motion. No JVD present. No thyromegaly present.  Cardiovascular: Normal rate, regular rhythm, normal heart sounds and intact distal pulses.  Exam reveals no gallop and no friction rub.   No murmur heard. Pulmonary/Chest: Effort normal and breath sounds normal. No respiratory distress. He has no  wheezes. He has no rales. He exhibits no tenderness.  Abdominal: Soft. Bowel sounds are normal. He exhibits no distension and no mass. There is no tenderness. There is no rebound and no guarding.  Musculoskeletal: Normal range of motion. He exhibits no edema or tenderness.  Lymphadenopathy:    He has no cervical adenopathy.  Neurological: He is alert and oriented to person, place, and time. He has normal reflexes. No cranial nerve deficit. He exhibits normal muscle tone. He displays a negative Romberg sign. Coordination  and gait normal.  Skin: Skin is warm and dry. Rash noted.  Psychiatric: He has a normal mood and affect. His behavior is normal. Judgment and thought content normal.  small patches of rash - scalp LS w/less pain  Lab Results  Component Value Date   WBC 5.4 10/16/2016   HGB 15.5 10/16/2016   HCT 44.3 10/16/2016   PLT 268.0 10/16/2016   GLUCOSE 98 09/18/2016   ALT 27 09/18/2016   AST 27 09/18/2016   NA 140 09/18/2016   K 4.1 09/18/2016   CL 101 09/18/2016   CREATININE 0.80 09/18/2016   BUN 8 09/18/2016   CO2 29 09/18/2016   TSH 1.31 09/18/2016    Dg Cervical Spine Complete  Result Date: 09/18/2016 CLINICAL DATA:  Cervicalgia radiating into shoulders EXAM: CERVICAL SPINE - COMPLETE 4+ VIEW COMPARISON:  None. FINDINGS: Frontal, lateral, open-mouth odontoid, and bilateral oblique views were obtained. There is no fracture or spondylolisthesis. Prevertebral soft tissues and predental space regions are normal. There is slight disc space narrowing at C5-6. The disc spaces appear normal. There is no appreciable exit foraminal narrowing on the oblique views. IMPRESSION: Slight disc space narrowing C5-6. Other disc spaces appear unremarkable. No fracture or spondylolisthesis. Electronically Signed   By: Lowella Grip III M.D.   On: 09/18/2016 08:09   Dg Lumbar Spine 2-3 Views  Result Date: 09/18/2016 CLINICAL DATA:  Low back pain for 3 days, no known injury EXAM: LUMBAR SPINE - 2-3 VIEW COMPARISON:  None. FINDINGS: Three views of the lumbar spine submitted. No acute fracture or subluxation. Probable Schmorl's node deformity noted lower endplate of L3 vertebral body. Mild disc space flattening at L3-L4 level. Mild to moderate disc space flattening at L5-S1 level. Alignment and vertebral body heights are preserved. IMPRESSION: No acute fracture or subluxation. Mild degenerative changes as described above. Electronically Signed   By: Lahoma Crocker M.D.   On: 09/18/2016 08:09    Assessment & Plan:     There are no diagnoses linked to this encounter. I am having Mr. Sulkowski maintain his esomeprazole, methylphenidate, CONCERTA, clonazePAM, divalproex, lamoTRIgine, celecoxib, cyclobenzaprine, Vitamin D, Vitamin D3, b complex vitamins, calcipotriene, and diclofenac sodium.  No orders of the defined types were placed in this encounter.    Follow-up: No Follow-up on file.  Walker Kehr, MD

## 2017-05-26 NOTE — Assessment & Plan Note (Signed)
Start Vit D Rx

## 2017-05-26 NOTE — Assessment & Plan Note (Signed)
Chronic - scalp Dr Denna Haggard On Dovonex

## 2017-05-26 NOTE — Assessment & Plan Note (Signed)
On Celebrex 

## 2017-05-26 NOTE — Assessment & Plan Note (Signed)
On Nexium 

## 2017-07-02 ENCOUNTER — Ambulatory Visit: Payer: BLUE CROSS/BLUE SHIELD | Admitting: Family Medicine

## 2017-07-03 ENCOUNTER — Ambulatory Visit: Payer: BLUE CROSS/BLUE SHIELD | Admitting: Family

## 2017-07-03 ENCOUNTER — Encounter: Payer: Self-pay | Admitting: Family

## 2017-07-03 VITALS — BP 144/88 | HR 75 | Temp 97.7°F | Ht 69.0 in | Wt 179.0 lb

## 2017-07-03 DIAGNOSIS — L600 Ingrowing nail: Secondary | ICD-10-CM | POA: Diagnosis not present

## 2017-07-03 NOTE — Progress Notes (Signed)
Francisco Morgan is a 41 y.o. male with the following history as recorded in [employer]:  Patient Active Problem List   Diagnosis Date Noted  . Vitamin D deficiency 05/26/2017  . Pain in right ankle and joints of right foot 12/31/2016  . Hematochezia 10/16/2016  . Low back pain 09/17/2016  . Neck pain 09/17/2016  . Psoriasis 09/17/2016  . Psoriatic arthritis ([place]) 09/17/2016  . GERD with stricture 09/17/2016    Current Outpatient Medications  Medication Sig Dispense Refill  . b complex vitamins tablet Take 1 tablet by mouth daily. 100 tablet 3  . calcipotriene (DOVONOX) 0.005 % cream   0  . celecoxib (CELEBREX) 200 MG capsule Take 1 capsule (200 mg total) by mouth 2 (two) times daily. 60 capsule 2  . Cholecalciferol (VITAMIN D) 2000 units CAPS 1 daily 100 capsule 3  . Cholecalciferol (VITAMIN D3) 50000 units CAPS Take 1 capsule by mouth once a week. 8 capsule 0  . clonazePAM (KLONOPIN) 0.5 MG tablet   2  . CONCERTA 36 MG CR tablet   0  . cyclobenzaprine (FLEXERIL) 5 MG tablet Take 1 tablet (5 mg total) by mouth 3 (three) times daily as needed for muscle spasms. 30 tablet 1  . diclofenac sodium (VOLTAREN) 1 % GEL Apply 2-4 g topically 4 (four) times daily. 100 g 1  . divalproex (DEPAKOTE) 500 MG DR tablet Take 500 mg by mouth 3 (three) times daily.    Marland Kitchen esomeprazole (NEXIUM) 40 MG capsule Take 1 capsule (40 mg total) by mouth daily before breakfast. 90 capsule 4  . lamoTRIgine (LAMICTAL) 200 MG tablet Take 200 mg by mouth daily.    . methylphenidate (RITALIN) 10 MG tablet   0   No current facility-administered medications for this visit.     Allergies: Nsaids  Past Medical History:  Diagnosis Date  . ADHD    Dr. Toy Care  . Arthritis    ?psoriatic  . Depression    Dr. Toy Care - bipolar depression w/rapid cycling   . GERD (gastroesophageal reflux disease)    stricture 2005  . PTSD (post-traumatic stress disorder)    Dr. Toy Care  . Rapid cycling bipolar disorder ([place])    Dr. Toy Care     Past Surgical History:  Procedure Laterality Date  . ANKLE SURGERY Right 2017   Tarsal Tunnel  . FOREARM / WRIST TUMOR EXCISION  1994   Myxoma    Family History  Problem Relation Age of Onset  . Hyperlipidemia Father   . Arthritis Maternal Grandmother   . Heart attack Maternal Grandfather   . Hyperlipidemia Maternal Grandfather   . Alcohol abuse Paternal Grandmother   . Mental illness Paternal Grandmother   . Colon cancer Paternal Grandfather     Social History   Tobacco Use  . Smoking status: Former Smoker    Packs/day: 1.00    Years: 7.00    Pack years: 7.00    Types: Cigarettes  . Smokeless tobacco: Never Used  Substance Use Topics  . Alcohol use: Yes    Alcohol/week: 4.2 oz    Types: 7 Shots of liquor per week    Subjective:  Patient presents with concerns about bilateral ingrown toenails; does have known psoriasis and feels this condition is making nails thicker/ discolored; has had removal done in the past 6-7 years; wonders about this option again to help with the symptoms.   Objective:  Vitals:   07/03/17 0853  BP: (!) 144/88  Pulse: 75  Temp:  97.7 F (36.5 C)  TempSrc: Oral  SpO2: 99%  Weight: 179 lb (81.2 kg)  Height: 5\' 9"  (1.753 m)    General: Well developed, well nourished, in no acute distress  Skin : Warm and dry. Bilateral ingrown toenails- no infection seen;  Head: Normocephalic and atraumatic  Lungs: Respirations unlabored; clear to auscultation bilaterally without wheeze, rales, rhonchi  CVS exam: normal rate and regular rhythm.  Abdomen: Soft; nontender; nondistended; normoactive bowel sounds; no masses or hepatosplenomegaly  Vessels: Symmetric bilaterally  Neurologic: Alert and oriented; speech intact; face symmetrical; moves all extremities well; CNII-XII intact without focal deficit   Assessment:  1. Ingrown toenail without infection     Plan:  1. Refer to podiatry to discuss removal/ possible permanent removal options; discussed  Epsom salt soaks to help until he sees specialist;  Patient's blood pressure was elevated today; he denies prior history of high blood pressure and previous readings have been normal; he is to start checking regularly and follow-up if consistently above 140/90.   No Follow-up on file.  Orders Placed This Encounter  Procedures  . Ambulatory referral to Podiatry    Referral Priority:   Routine    Referral Type:   Consultation    Referral Reason:   Specialty Services Required    Requested Specialty:   Podiatry    Number of Visits Requested:   1    Requested Prescriptions    No prescriptions requested or ordered in this encounter

## 2017-07-05 ENCOUNTER — Encounter: Payer: Self-pay | Admitting: Internal Medicine

## 2017-07-08 NOTE — Telephone Encounter (Signed)
Referral has was sent to [facility] on 07/03/17

## 2017-07-15 DIAGNOSIS — L03031 Cellulitis of right toe: Secondary | ICD-10-CM | POA: Diagnosis not present

## 2017-07-15 DIAGNOSIS — L602 Onychogryphosis: Secondary | ICD-10-CM | POA: Diagnosis not present

## 2017-07-15 DIAGNOSIS — M79674 Pain in right toe(s): Secondary | ICD-10-CM | POA: Diagnosis not present

## 2017-07-15 DIAGNOSIS — L02611 Cutaneous abscess of right foot: Secondary | ICD-10-CM | POA: Diagnosis not present

## 2017-07-30 DIAGNOSIS — L03031 Cellulitis of right toe: Secondary | ICD-10-CM | POA: Diagnosis not present

## 2017-07-30 DIAGNOSIS — L03032 Cellulitis of left toe: Secondary | ICD-10-CM | POA: Diagnosis not present

## 2017-08-13 DIAGNOSIS — L03031 Cellulitis of right toe: Secondary | ICD-10-CM | POA: Diagnosis not present

## 2017-08-13 DIAGNOSIS — L03032 Cellulitis of left toe: Secondary | ICD-10-CM | POA: Diagnosis not present

## 2017-08-27 DIAGNOSIS — F411 Generalized anxiety disorder: Secondary | ICD-10-CM | POA: Diagnosis not present

## 2017-08-27 DIAGNOSIS — F900 Attention-deficit hyperactivity disorder, predominantly inattentive type: Secondary | ICD-10-CM | POA: Diagnosis not present

## 2017-08-27 DIAGNOSIS — F3176 Bipolar disorder, in full remission, most recent episode depressed: Secondary | ICD-10-CM | POA: Diagnosis not present

## 2017-08-27 DIAGNOSIS — F3174 Bipolar disorder, in full remission, most recent episode manic: Secondary | ICD-10-CM | POA: Diagnosis not present

## 2017-09-03 ENCOUNTER — Encounter (HOSPITAL_COMMUNITY): Payer: Self-pay | Admitting: Psychiatry

## 2017-09-03 ENCOUNTER — Ambulatory Visit (HOSPITAL_COMMUNITY): Payer: BLUE CROSS/BLUE SHIELD | Admitting: Psychiatry

## 2017-09-03 DIAGNOSIS — Z87891 Personal history of nicotine dependence: Secondary | ICD-10-CM | POA: Diagnosis not present

## 2017-09-03 DIAGNOSIS — Z811 Family history of alcohol abuse and dependence: Secondary | ICD-10-CM

## 2017-09-03 DIAGNOSIS — F0630 Mood disorder due to known physiological condition, unspecified: Secondary | ICD-10-CM

## 2017-09-03 DIAGNOSIS — F1099 Alcohol use, unspecified with unspecified alcohol-induced disorder: Secondary | ICD-10-CM | POA: Diagnosis not present

## 2017-09-03 DIAGNOSIS — F319 Bipolar disorder, unspecified: Secondary | ICD-10-CM | POA: Diagnosis not present

## 2017-09-03 DIAGNOSIS — Z818 Family history of other mental and behavioral disorders: Secondary | ICD-10-CM

## 2017-09-03 NOTE — Progress Notes (Signed)
Psychiatric Initial Adult Assessment   Patient Identification: RHODES CALVERT MRN:  [id number] Date of Evaluation:  09/03/2017 Referral Source: self Chief Complaint:  switching psychiatrist  Visit Diagnosis:    ICD-10-CM   1. Mood disorder in conditions classified elsewhere F06.30   2. Bipolar affective disorder, remission status unspecified ([place]) F31.9     History of Present Illness:  SANFORD LINDBLAD is a 42 year old male with a psychiatric history of bipolar disorder, rapid cycling, ADHD, PTSD, depression, who presents today for psychiatric intake assessment.  He recently fired his prior psychiatrist, Dr. Toy Care, and elaborates that this is because her office was a mess.  He spends much of our initial meeting discussing how her food smelled, her office smelled, and things there were chaotic and unprofessional.  He reports that he had seen this provider after his prior psychiatrist Dr. Caprice Beaver closed her practice to pursue [place].  He reports that he had seen Dr. Caprice Beaver for nearly 6 years and she was very "chill and very intelligent".  The patient launched into his family history without prompting, sharing that his father is the founder of [employer] the family practice, and his mother works in the mental health care field and is elaborating on how she brought harm reduction to the [place].  He is quite elaborate on how his family has accomplished so much, but then also spends time discussing that he grew up in a somewhat traumatizing home environment and had a strained relationship with his parents.  He shares that he has been struggling with mental health issues, including lying, aggressive behaviors, deceitful nature, spending time with criminal friends and peers since his teenage years.  He shares that he grew up in poverty and and describes living in a nontraditional household, not having television and air conditioning and having to chop up wood for heat.  He is able to be  interrupted and redirected, not particularly manic, but he does appear hyperverbal, and grandiose.  He does not present with psychotic symptoms or auditory or visual hallucinations.  He shares that he has a mistrust of others.  He spends much of his time discussing his difficulties with and during the stupidity of the world and feeling that he needs to be challenged by being around people who we can have an intelligent conversation with.  He shares that he works at [employer], and he does some Architect work on the side.  He also shares that he continues to live with his mom and dad and that it ends up working out okay because they travel much of the time overseas or around the country, or they are working.  He repeats multiple times that he realizes he has a very unique and special medication panel.  He reports that it has taken 20 years to get to this medication panel and he wants to make sure it is not tampered with.  He is on Depakote, Lamictal, Concerta, Ritalin, clonazepam.  He spent time sharing that he struggles with periods of verbal and physical aggression, issues with anger, and this medication regimen seems to keep him stable.  Throughout our conversation the patient displays external locus of control, often undermining the values and opinions of others around him and his interpersonal life.  I inquired if his personality has gotten in the way of his relationships and caused him to have burned bridges, and he shares that it certainly has.  He is often sarcastic throughout her interaction, and passive-aggressive  and some of his remarks.  He has difficulty expressing any insight or introspection into how he has contributed to the negative interactions of his life.  For instance he shares that he had been arrested for communicating threats and physical assault, but qualifies the entire interaction as his response to the incompetence of his employee.  I spent time with the patient  reviewing that part of the psychiatric assessment is for this provider to be able to ascertain if a particular patient's needs can be met by this clinic.  I expressed my concern about his complex medication regimen and his complex needs and recommended he seek care with a more experienced psychiatrist and possibly a private practice that may have more availability to see him more frequently.  He was agreeable with this decision, and was polite, expressed appreciation for providers candor and agreed to pursue alternative psychiatric medication management.  Past Psychiatric History: Denies any psychiatric hospitalizations  Previous Psychotropic Medications: Yes   Substance Abuse History in the last 12 months:  No.  Consequences of Substance Abuse: Negative  Past Medical History:  Past Medical History:  Diagnosis Date  . ADHD    Dr. Toy Care  . Arthritis    ?psoriatic  . Depression    Dr. Toy Care - bipolar depression w/rapid cycling   . GERD (gastroesophageal reflux disease)    stricture 2005  . PTSD (post-traumatic stress disorder)    Dr. Toy Care  . Rapid cycling bipolar disorder ([place])    Dr. Toy Care    Past Surgical History:  Procedure Laterality Date  . ANKLE SURGERY Right 2017   Tarsal Tunnel  . FOREARM / WRIST TUMOR EXCISION  1994   Myxoma    Family Psychiatric History: denies  Family History:  Family History  Problem Relation Age of Onset  . Hyperlipidemia Father   . Arthritis Maternal Grandmother   . Heart attack Maternal Grandfather   . Hyperlipidemia Maternal Grandfather   . Alcohol abuse Paternal Grandmother   . Mental illness Paternal Grandmother   . Colon cancer Paternal Grandfather     Social History:   Social History   Socioeconomic History  . Marital status: Single    Spouse name: Not on file  . Number of children: 3  . Years of education: 6  . Highest education level: Not on file  Social Needs  . Financial resource strain: Not on file  . Food insecurity -  worry: Not on file  . Food insecurity - inability: Not on file  . Transportation needs - medical: Not on file  . Transportation needs - non-medical: Not on file  Occupational History  . Occupation: Realtor  Tobacco Use  . Smoking status: Former Smoker    Packs/day: 1.00    Years: 7.00    Pack years: 7.00    Types: Cigarettes  . Smokeless tobacco: Never Used  Substance and Sexual Activity  . Alcohol use: Yes    Alcohol/week: 4.2 oz    Types: 7 Shots of liquor per week  . Drug use: No  . Sexual activity: Yes  Other Topics Concern  . Not on file  Social History Narrative  . Not on file    Additional Social History: Lives with his mom and dad, never married, no children  Allergies:   Allergies  Allergen Reactions  . Nsaids Other (See Comments)    Constipation    Metabolic Disorder Labs: No results found for: HGBA1C, MPG No results found for: PROLACTIN No results found for:  CHOL, TRIG, HDL, CHOLHDL, VLDL, LDLCALC   Current Medications: Current Outpatient Medications  Medication Sig Dispense Refill  . b complex vitamins tablet Take 1 tablet by mouth daily. 100 tablet 3  . calcipotriene (DOVONOX) 0.005 % cream   0  . celecoxib (CELEBREX) 200 MG capsule Take 1 capsule (200 mg total) by mouth 2 (two) times daily. 60 capsule 2  . Cholecalciferol (VITAMIN D) 2000 units CAPS 1 daily 100 capsule 3  . Cholecalciferol (VITAMIN D3) 50000 units CAPS Take 1 capsule by mouth once a week. 8 capsule 0  . clonazePAM (KLONOPIN) 0.5 MG tablet   2  . CONCERTA 36 MG CR tablet   0  . cyclobenzaprine (FLEXERIL) 5 MG tablet Take 1 tablet (5 mg total) by mouth 3 (three) times daily as needed for muscle spasms. 30 tablet 1  . diclofenac sodium (VOLTAREN) 1 % GEL Apply 2-4 g topically 4 (four) times daily. 100 g 1  . divalproex (DEPAKOTE) 500 MG DR tablet Take 500 mg by mouth 3 (three) times daily.    Marland Kitchen esomeprazole (NEXIUM) 40 MG capsule Take 1 capsule (40 mg total) by mouth daily before  breakfast. 90 capsule 4  . lamoTRIgine (LAMICTAL) 200 MG tablet Take 200 mg by mouth daily.    . methylphenidate (RITALIN) 10 MG tablet   0   No current facility-administered medications for this visit.     Neurologic: Headache: Negative Seizure: Negative Paresthesias:Negative  Musculoskeletal: Strength & Muscle Tone: within normal limits Gait & Station: normal Patient leans: N/A  Psychiatric Specialty Exam: ROS  There were no vitals taken for this visit.There is no height or weight on file to calculate BMI.  General Appearance: Casual, Meticulous and Well Groomed  Eye Contact:  Good  Speech:  Clear and Coherent and Normal Rate  Volume:  Normal  Mood:  Euphoric and Euthymic  Affect:  Congruent  Thought Process:  Goal Directed and Descriptions of Associations: Intact  Orientation:  Full (Time, Place, and Person)  Thought Content:  Logical  Suicidal Thoughts:  No  Homicidal Thoughts:  No  Memory:  Immediate;   Fair  Judgement:  Fair  Insight:  Shallow  Psychomotor Activity:  Normal  Concentration:  Concentration: Good  Recall:  Good  Fund of Knowledge:Good  Language: Good  Akathisia:  Negative  Handed:  Right  AIMS (if indicated):  na  Assets:  Desire for Improvement Housing Transportation  ADL's:  Intact  Cognition: WNL  Sleep:  fair    Treatment Plan Summary: JANIE STROTHMAN is a 42 year old male with a self-reported psychiatric history of ADHD, PTSD, bipolar disorder with rapid cycling, impulse control disorder, conduct disordered behavior in childhood, and antisocial behaviors of adult, who presents today for psychiatric intake assessment.  The patient presents with a complex medication regimen, and frankly presents with a complex psychiatric presentation concerning for cluster B personality, with narcissistic and antisocial personality traits.  He is primarily concerned with medication management, and does not appear to be motivated to engage in any behavioral  changes or individual therapy.  He presents on a medication regimen which I do not feel comfortable with continuing.  He declines individual therapy when offered, and during our interaction has difficulty in considering any personal contributing factors to his interpersonal and familial stress.  He is not appropriate for this clinic, and we have provided referrals to other psychiatrist that may be more amenable to his medication management needs and have experience with complex regimens.  1.  Mood disorder in conditions classified elsewhere   2. Bipolar affective disorder, remission status unspecified ([place])     Status of current problems: new  Labs Ordered: No orders of the defined types were placed in this encounter.   Labs Reviewed: n/a  Collateral Obtained/Records Reviewed: [facility]  Plan:  Referred to outside psychiatric medication management providers, no further follow-up with this writer  I spent 45 minutes with the patient in direct face-to-face clinical care.  Greater than 50% of this time was spent in counseling and coordination of care with the patient.   Aundra Dubin, MD 2/7/20193:19 PM

## 2017-09-16 DIAGNOSIS — L400 Psoriasis vulgaris: Secondary | ICD-10-CM | POA: Diagnosis not present

## 2017-09-16 DIAGNOSIS — L4059 Other psoriatic arthropathy: Secondary | ICD-10-CM | POA: Diagnosis not present

## 2017-09-16 DIAGNOSIS — Z1589 Genetic susceptibility to other disease: Secondary | ICD-10-CM | POA: Diagnosis not present

## 2017-09-16 DIAGNOSIS — M1A09X0 Idiopathic chronic gout, multiple sites, without tophus (tophi): Secondary | ICD-10-CM | POA: Diagnosis not present

## 2017-09-16 DIAGNOSIS — R5383 Other fatigue: Secondary | ICD-10-CM | POA: Diagnosis not present

## 2017-09-16 LAB — CBC AND DIFFERENTIAL
HEMATOCRIT: 43 (ref 41–53)
Hemoglobin: 14.7 (ref 13.5–17.5)
NEUTROS ABS: 4
Platelets: 274 (ref 150–399)
WBC: 6.6

## 2017-09-16 LAB — HM HEPATITIS C SCREENING LAB: HM Hepatitis Screen: NEGATIVE

## 2017-09-16 LAB — BASIC METABOLIC PANEL
BUN: 10 (ref 4–21)
Creatinine: 0.7 (ref 0.6–1.3)
Glucose: 7
Potassium: 4.8 (ref 3.4–5.3)
SODIUM: 141 (ref 137–147)

## 2017-09-16 LAB — HEPATIC FUNCTION PANEL
ALT: 21 (ref 10–40)
AST: 16 (ref 14–40)
Alkaline Phosphatase: 88 (ref 25–125)
BILIRUBIN, TOTAL: 0.5

## 2017-10-07 ENCOUNTER — Other Ambulatory Visit: Payer: Self-pay | Admitting: Internal Medicine

## 2017-10-07 LAB — QUANTIFERON-TB GOLD PLUS
QUANTIFERON TB1 AG VALUE: 0.04
QuantiFERON TB2 Ag Value: 0.04
QuantiFERON-TB Gold Plus: NEGATIVE
Quantiferon Nil Value: 0.03

## 2017-10-07 LAB — HEPATITIS PANEL, ACUTE
HBsAg Screen: NEGATIVE
HEP A IGM: NEGATIVE
HEP C VIRUS AB: 0.1
Hep B Core Ab, IgM: NEGATIVE

## 2017-10-15 DIAGNOSIS — F3177 Bipolar disorder, in partial remission, most recent episode mixed: Secondary | ICD-10-CM | POA: Diagnosis not present

## 2017-10-15 DIAGNOSIS — F909 Attention-deficit hyperactivity disorder, unspecified type: Secondary | ICD-10-CM | POA: Diagnosis not present

## 2017-10-15 DIAGNOSIS — F4310 Post-traumatic stress disorder, unspecified: Secondary | ICD-10-CM | POA: Diagnosis not present

## 2017-10-28 DIAGNOSIS — M1A09X0 Idiopathic chronic gout, multiple sites, without tophus (tophi): Secondary | ICD-10-CM | POA: Diagnosis not present

## 2017-10-28 DIAGNOSIS — Z1589 Genetic susceptibility to other disease: Secondary | ICD-10-CM | POA: Diagnosis not present

## 2017-10-28 DIAGNOSIS — L4059 Other psoriatic arthropathy: Secondary | ICD-10-CM | POA: Diagnosis not present

## 2017-10-28 DIAGNOSIS — L400 Psoriasis vulgaris: Secondary | ICD-10-CM | POA: Diagnosis not present

## 2017-11-06 ENCOUNTER — Other Ambulatory Visit: Payer: Self-pay | Admitting: Internal Medicine

## 2017-11-11 ENCOUNTER — Ambulatory Visit: Payer: BLUE CROSS/BLUE SHIELD | Admitting: Nurse Practitioner

## 2017-11-11 ENCOUNTER — Encounter: Payer: Self-pay | Admitting: Nurse Practitioner

## 2017-11-11 VITALS — BP 140/80 | HR 74 | Temp 98.4°F | Resp 16 | Ht 69.0 in | Wt 175.0 lb

## 2017-11-11 DIAGNOSIS — S0592XA Unspecified injury of left eye and orbit, initial encounter: Secondary | ICD-10-CM | POA: Diagnosis not present

## 2017-11-11 MED ORDER — TOBRAMYCIN 0.3 % OP SOLN: 1.0000 [drp] | OPHTHALMIC | 0 refills | Status: DC

## 2017-11-11 NOTE — Progress Notes (Signed)
Name: Francisco Morgan   MRN: [id number]    DOB: 1975/08/08   Date:11/11/2017       Progress Note  Subjective  Chief Complaint  Chief Complaint  Patient presents with  . Eye Problem    left eye problem, states he got something in his eye from sanding at work, has had swelling and discharge from eye    HPI  Eye problem- This is an acute problem He was sanding wood yesterday and felt several particles fly into his eyes. He woke this morning with pain and redness to left eye, swelling to his left eyelid and yellow discharge from the eye. He applied warm compresses to the eye this am and The eyelid swelling seems to have gotten better throughout the day but continues to have drainage and pain in the eye.   He continues to feel sensation of a foreign body in the eye throughout the day today He denies any vision changes. He has a history of shingles in this eye.   Patient Active Problem List   Diagnosis Date Noted  . Vitamin D deficiency 05/26/2017  . Pain in right ankle and joints of right foot 12/31/2016  . Hematochezia 10/16/2016  . Low back pain 09/17/2016  . Neck pain 09/17/2016  . Psoriasis 09/17/2016  . Psoriatic arthritis ([place]) 09/17/2016  . GERD with stricture 09/17/2016    Social History   Tobacco Use  . Smoking status: Former Smoker    Packs/day: 1.00    Years: 7.00    Pack years: 7.00    Types: Cigarettes  . Smokeless tobacco: Never Used  Substance Use Topics  . Alcohol use: Yes    Alcohol/week: 4.2 oz    Types: 7 Shots of liquor per week     Current Outpatient Medications:  .  b complex vitamins tablet, Take 1 tablet by mouth daily., Disp: 100 tablet, Rfl: 3 .  calcipotriene (DOVONOX) 0.005 % cream, , Disp: , Rfl: 0 .  celecoxib (CELEBREX) 200 MG capsule, Take 1 capsule (200 mg total) by mouth 2 (two) times daily. Annual appt is due must see provider for future refills, Disp: 60 capsule, Rfl: 0 .  Cholecalciferol (VITAMIN D) 2000 units CAPS, 1 daily,  Disp: 100 capsule, Rfl: 3 .  Cholecalciferol (VITAMIN D3) 50000 units CAPS, Take 1 capsule by mouth once a week., Disp: 8 capsule, Rfl: 0 .  clonazePAM (KLONOPIN) 0.5 MG tablet, , Disp: , Rfl: 2 .  CONCERTA 36 MG CR tablet, , Disp: , Rfl: 0 .  cyclobenzaprine (FLEXERIL) 5 MG tablet, Take 1 tablet (5 mg total) by mouth 3 (three) times daily as needed for muscle spasms., Disp: 30 tablet, Rfl: 1 .  diclofenac sodium (VOLTAREN) 1 % GEL, Apply 2-4 g topically 4 (four) times daily., Disp: 100 g, Rfl: 1 .  divalproex (DEPAKOTE) 500 MG DR tablet, Take 500 mg by mouth 3 (three) times daily., Disp: , Rfl:  .  esomeprazole (NEXIUM) 40 MG capsule, Take 1 capsule (40 mg total) by mouth daily before breakfast., Disp: 90 capsule, Rfl: 4 .  lamoTRIgine (LAMICTAL) 200 MG tablet, Take 200 mg by mouth daily., Disp: , Rfl:  .  methylphenidate (RITALIN) 10 MG tablet, , Disp: , Rfl: 0 .  tobramycin (TOBREX) 0.3 % ophthalmic solution, Place 1 drop into the left eye every 4 (four) hours., Disp: 5 mL, Rfl: 0  Allergies  Allergen Reactions  . Nsaids Other (See Comments)    Constipation    ROS  No other specific complaints in a complete review of systems (except as listed in HPI above).  Objective  Vitals:   11/11/17 1531  BP: 140/80  Pulse: 74  Resp: 16  Temp: 98.4 F (36.9 C)  TempSrc: Oral  SpO2: 97%  Weight: 175 lb (79.4 kg)  Height: 5\' 9"  (1.753 m)   Body mass index is 25.84 kg/m.  Nursing Note and Vital Signs reviewed.  Physical Exam  Constitutional: Patient appears well-developed and well-nourished. No distress.  HEENT: head atraumatic, normocephalic, Eyes: pupils equal and reactive to light, EOM's intact, left conjunctiva injected, left eyelid is swollen Neck: neck supple without lymphadenopathy,  Throat: oropharynx pink and moist without exudate Cardiovascular: Normal rate, regular rhythm, S1/S2 present.   Pulmonary/Chest: Effort normal and breath sounds clear. No respiratory distress  or retractions. Skin: warm and dry. Erythema noted around left eye. Psychiatric: Patient has a normal mood and affect.   Assessment & Plan  Left eye injury, initial encounter Will initiate abx treatment and urgent referral to ophthalmology for possible corneal abrasion, foreign body in eye. Recommended he go to walk-in eye center this evening for immediate evaluation but he says he wants to go home to bed after working all day today and will F/u with ophthalmology tomorrow. - Ambulatory referral to Ophthalmology - tobramycin (TOBREX) 0.3 % ophthalmic solution; Place 1 drop into the left eye every 4 (four) hours.  Dispense: 5 mL; Refill: 0

## 2017-11-11 NOTE — Patient Instructions (Signed)
Please start tobramycin 1 drop every 4 hours in your left eye.  I have placed a referral to opthalmology. Our office will call you to schedule this appointment. Please let us know if you do not hear about the referral by tomorrow around lunch.

## 2017-11-12 DIAGNOSIS — H10413 Chronic giant papillary conjunctivitis, bilateral: Secondary | ICD-10-CM | POA: Diagnosis not present

## 2017-11-26 DIAGNOSIS — F3177 Bipolar disorder, in partial remission, most recent episode mixed: Secondary | ICD-10-CM | POA: Diagnosis not present

## 2017-12-08 ENCOUNTER — Other Ambulatory Visit: Payer: Self-pay | Admitting: Internal Medicine

## 2017-12-08 DIAGNOSIS — M79675 Pain in left toe(s): Secondary | ICD-10-CM | POA: Diagnosis not present

## 2017-12-08 DIAGNOSIS — M79674 Pain in right toe(s): Secondary | ICD-10-CM | POA: Diagnosis not present

## 2017-12-30 DIAGNOSIS — Z1589 Genetic susceptibility to other disease: Secondary | ICD-10-CM | POA: Diagnosis not present

## 2017-12-30 DIAGNOSIS — L400 Psoriasis vulgaris: Secondary | ICD-10-CM | POA: Diagnosis not present

## 2017-12-30 DIAGNOSIS — L4059 Other psoriatic arthropathy: Secondary | ICD-10-CM | POA: Diagnosis not present

## 2017-12-30 DIAGNOSIS — M1A09X0 Idiopathic chronic gout, multiple sites, without tophus (tophi): Secondary | ICD-10-CM | POA: Diagnosis not present

## 2018-02-09 ENCOUNTER — Emergency Department (HOSPITAL_COMMUNITY): Payer: BLUE CROSS/BLUE SHIELD

## 2018-02-09 ENCOUNTER — Encounter (HOSPITAL_COMMUNITY): Payer: Self-pay | Admitting: Emergency Medicine

## 2018-02-09 ENCOUNTER — Emergency Department (HOSPITAL_COMMUNITY)
Admission: EM | Admit: 2018-02-09 | Discharge: 2018-02-10 | Disposition: A | Discharge status: Home / Self Care | Payer: BLUE CROSS/BLUE SHIELD | Attending: Emergency Medicine | Admitting: Emergency Medicine

## 2018-02-09 ENCOUNTER — Other Ambulatory Visit: Payer: Self-pay

## 2018-02-09 DIAGNOSIS — R55 Syncope and collapse: Secondary | ICD-10-CM | POA: Insufficient documentation

## 2018-02-09 DIAGNOSIS — Z79899 Other long term (current) drug therapy: Secondary | ICD-10-CM | POA: Diagnosis not present

## 2018-02-09 DIAGNOSIS — S069X9A Unspecified intracranial injury with loss of consciousness of unspecified duration, initial encounter: Secondary | ICD-10-CM | POA: Diagnosis not present

## 2018-02-09 DIAGNOSIS — Y92410 Unspecified street and highway as the place of occurrence of the external cause: Secondary | ICD-10-CM | POA: Diagnosis not present

## 2018-02-09 DIAGNOSIS — Y999 Unspecified external cause status: Secondary | ICD-10-CM | POA: Insufficient documentation

## 2018-02-09 DIAGNOSIS — Y9389 Activity, other specified: Secondary | ICD-10-CM | POA: Diagnosis not present

## 2018-02-09 DIAGNOSIS — R000 Tachycardia, unspecified: Secondary | ICD-10-CM | POA: Diagnosis not present

## 2018-02-09 DIAGNOSIS — Z87891 Personal history of nicotine dependence: Secondary | ICD-10-CM | POA: Insufficient documentation

## 2018-02-09 DIAGNOSIS — S81811A Laceration without foreign body, right lower leg, initial encounter: Secondary | ICD-10-CM | POA: Diagnosis not present

## 2018-02-09 DIAGNOSIS — S060XAA Concussion with loss of consciousness status unknown, initial encounter: Secondary | ICD-10-CM

## 2018-02-09 DIAGNOSIS — R4020 Unspecified coma: Secondary | ICD-10-CM | POA: Diagnosis not present

## 2018-02-09 DIAGNOSIS — S0990XA Unspecified injury of head, initial encounter: Secondary | ICD-10-CM | POA: Diagnosis not present

## 2018-02-09 DIAGNOSIS — R58 Hemorrhage, not elsewhere classified: Secondary | ICD-10-CM | POA: Diagnosis not present

## 2018-02-09 DIAGNOSIS — S060X9A Concussion with loss of consciousness of unspecified duration, initial encounter: Secondary | ICD-10-CM

## 2018-02-09 HISTORY — DX: Concussion with loss of consciousness status unknown, initial encounter: S06.0XAA

## 2018-02-09 HISTORY — DX: Concussion with loss of consciousness of unspecified duration, initial encounter: S06.0X9A

## 2018-02-09 LAB — CBC WITH DIFFERENTIAL/PLATELET
Abs Immature Granulocytes: 0 10*3/uL (ref 0.0–0.1)
BASOS ABS: 0 10*3/uL (ref 0.0–0.1)
Basophils Relative: 0 %
EOS PCT: 1 %
Eosinophils Absolute: 0.1 10*3/uL (ref 0.0–0.7)
HCT: 39.6 % (ref 39.0–52.0)
HEMOGLOBIN: 13.3 g/dL (ref 13.0–17.0)
Immature Granulocytes: 0 %
Lymphocytes Relative: 18 %
Lymphs Abs: 1.4 10*3/uL (ref 0.7–4.0)
MCH: 31.7 pg (ref 26.0–34.0)
MCHC: 33.6 g/dL (ref 30.0–36.0)
MCV: 94.5 fL (ref 78.0–100.0)
MONO ABS: 0.3 10*3/uL (ref 0.1–1.0)
Monocytes Relative: 4 %
Neutro Abs: 5.8 10*3/uL (ref 1.7–7.7)
Neutrophils Relative %: 77 %
Platelets: 213 10*3/uL (ref 150–400)
RBC: 4.19 MIL/uL — ABNORMAL LOW (ref 4.22–5.81)
RDW: 11.7 % (ref 11.5–15.5)
WBC: 7.6 10*3/uL (ref 4.0–10.5)

## 2018-02-09 LAB — COMPREHENSIVE METABOLIC PANEL
ALK PHOS: 48 U/L (ref 38–126)
ALT: 33 U/L (ref 0–44)
ANION GAP: 8 (ref 5–15)
AST: 33 U/L (ref 15–41)
Albumin: 4 g/dL (ref 3.5–5.0)
BUN: 10 mg/dL (ref 6–20)
CALCIUM: 9.1 mg/dL (ref 8.9–10.3)
CO2: 27 mmol/L (ref 22–32)
Chloride: 106 mmol/L (ref 98–111)
Creatinine, Ser: 0.81 mg/dL (ref 0.61–1.24)
Glucose, Bld: 103 mg/dL — ABNORMAL HIGH (ref 70–99)
Potassium: 3.5 mmol/L (ref 3.5–5.1)
SODIUM: 141 mmol/L (ref 135–145)
TOTAL PROTEIN: 6.6 g/dL (ref 6.5–8.1)
Total Bilirubin: 1 mg/dL (ref 0.3–1.2)

## 2018-02-09 MED ORDER — SODIUM CHLORIDE 0.9 % IV BOLUS
1000.0000 mL | Freq: Once | INTRAVENOUS | Status: AC
  Administered 2018-02-09: 1000 mL via INTRAVENOUS

## 2018-02-09 MED ORDER — LIDOCAINE-EPINEPHRINE (PF) 2 %-1:200000 IJ SOLN
10.0000 mL | Freq: Once | INTRAMUSCULAR | Status: AC
  Administered 2018-02-10: 10 mL
  Filled 2018-02-09: qty 20
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
## 2018-02-10 MED ORDER — CYCLOBENZAPRINE HCL 10 MG PO TABS: 10.0000 mg | ORAL_TABLET | Freq: Two times a day (BID) | ORAL | 0 refills | Status: DC | PRN

## 2018-02-17 DIAGNOSIS — F3177 Bipolar disorder, in partial remission, most recent episode mixed: Secondary | ICD-10-CM | POA: Diagnosis not present

## 2018-02-17 DIAGNOSIS — Z79899 Other long term (current) drug therapy: Secondary | ICD-10-CM | POA: Diagnosis not present

## 2018-02-18 DIAGNOSIS — F3177 Bipolar disorder, in partial remission, most recent episode mixed: Secondary | ICD-10-CM | POA: Diagnosis not present

## 2018-03-15 ENCOUNTER — Telehealth: Payer: Self-pay | Admitting: Internal Medicine

## 2018-03-15 NOTE — Telephone Encounter (Signed)
I have left patient a vm to call back in regard to appt that is scheduled for tomorrow through my chart. 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  I need more information from patient to make sure we will be able to assist him during his appt.

## 2018-03-16 ENCOUNTER — Ambulatory Visit (INDEPENDENT_AMBULATORY_CARE_PROVIDER_SITE_OTHER): Payer: BLUE CROSS/BLUE SHIELD | Admitting: Internal Medicine

## 2018-03-16 ENCOUNTER — Encounter: Payer: Self-pay | Admitting: Internal Medicine

## 2018-03-16 VITALS — BP 132/82 | HR 67 | Temp 97.7°F | Ht 68.0 in | Wt 168.0 lb

## 2018-03-16 DIAGNOSIS — S060X9A Concussion with loss of consciousness of unspecified duration, initial encounter: Secondary | ICD-10-CM | POA: Insufficient documentation

## 2018-03-16 DIAGNOSIS — S060X1D Concussion with loss of consciousness of 30 minutes or less, subsequent encounter: Secondary | ICD-10-CM | POA: Diagnosis not present

## 2018-03-16 DIAGNOSIS — S060XAA Concussion with loss of consciousness status unknown, initial encounter: Secondary | ICD-10-CM | POA: Insufficient documentation

## 2018-03-16 DIAGNOSIS — T675XXD Heat exhaustion, unspecified, subsequent encounter: Secondary | ICD-10-CM

## 2018-03-16 DIAGNOSIS — L4050 Arthropathic psoriasis, unspecified: Secondary | ICD-10-CM | POA: Diagnosis not present

## 2018-03-16 DIAGNOSIS — T675XXA Heat exhaustion, unspecified, initial encounter: Secondary | ICD-10-CM | POA: Insufficient documentation

## 2018-03-16 NOTE — Assessment & Plan Note (Addendum)
No relapse. Recovered DMV papers - will fill out
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
## 2018-03-17 ENCOUNTER — Encounter: Payer: Self-pay | Admitting: Internal Medicine

## 2018-03-17 NOTE — Assessment & Plan Note (Signed)
on Humira ?
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
## 2018-03-30 DIAGNOSIS — F3177 Bipolar disorder, in partial remission, most recent episode mixed: Secondary | ICD-10-CM | POA: Diagnosis not present

## 2018-04-08 DIAGNOSIS — F3177 Bipolar disorder, in partial remission, most recent episode mixed: Secondary | ICD-10-CM | POA: Diagnosis not present

## 2018-04-15 DIAGNOSIS — F3177 Bipolar disorder, in partial remission, most recent episode mixed: Secondary | ICD-10-CM | POA: Diagnosis not present

## 2018-04-22 DIAGNOSIS — F3177 Bipolar disorder, in partial remission, most recent episode mixed: Secondary | ICD-10-CM | POA: Diagnosis not present

## 2018-05-03 ENCOUNTER — Ambulatory Visit (INDEPENDENT_AMBULATORY_CARE_PROVIDER_SITE_OTHER): Payer: BLUE CROSS/BLUE SHIELD | Admitting: Orthopedic Surgery

## 2018-05-03 ENCOUNTER — Encounter (INDEPENDENT_AMBULATORY_CARE_PROVIDER_SITE_OTHER): Payer: Self-pay | Admitting: Orthopedic Surgery

## 2018-05-03 ENCOUNTER — Telehealth (INDEPENDENT_AMBULATORY_CARE_PROVIDER_SITE_OTHER): Payer: Self-pay

## 2018-05-03 VITALS — Ht 68.0 in | Wt 168.0 lb

## 2018-05-03 DIAGNOSIS — M25571 Pain in right ankle and joints of right foot: Secondary | ICD-10-CM

## 2018-05-03 MED ORDER — PREDNISONE 10 MG PO TABS: 20.0000 mg | ORAL_TABLET | Freq: Every day | ORAL | 0 refills | Status: DC

## 2018-05-03 NOTE — Telephone Encounter (Signed)
Called pt per Dr. Sharol Given to advise that there was a uric acid level drawn 09/2016 that was elevated at a 9.7 and did not know if there was any follow up or medication given to treat his gout. Would like to repeat uric acid with a nurse only visit here in our office or can send a request to another provider if he has a standing order for labs. Asked pt to call back and let us know what would be easier for him and we can arrange for it. Also asked if ever given any rx to treat gout in the past or if he is currently taking something. Will hold pending call back
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
## 2018-05-04 ENCOUNTER — Ambulatory Visit (INDEPENDENT_AMBULATORY_CARE_PROVIDER_SITE_OTHER): Payer: BLUE CROSS/BLUE SHIELD | Admitting: Psychiatry

## 2018-05-04 DIAGNOSIS — F3177 Bipolar disorder, in partial remission, most recent episode mixed: Secondary | ICD-10-CM | POA: Diagnosis not present

## 2018-05-04 NOTE — Progress Notes (Signed)
      [facility] Counselor/Therapist Progress Note   Patient ID: Francisco Morgan, MRN: [id number]  Date: 05/04/2018  Timespent: 60 minutes  Treatment Type: Individual  Subjective: Patient in today reporting anxiety, frustration, increased stress, some depression.  Also noted financial stressors. Patient very frustrated and some anger surfacing with the [employer] office.  Currently patient is seeking letter from his psychiatrist and his neurologist (Dr. Amil Amen)  to submit to [facility] for his license to be restored.  Calm and normall tone in his presentation; talking almost non-stop.  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  Personal goals are the same as "I'm aiming for stability mentally and emotionally so as to make good decisions and be as proficient as possible in my business." Talked about strategies, strengths, choices, and behaviors that support this goal and how he can best move forward.  Interventions:Solution Focused, Strength-based and Supportive  Mental Status Exam:   Appearance:   Casual     Behavior:  Sharing  Motor:  Normal  Speech/Language:   Talking almost nonstop  Affect:  Congruent  Mood:  anxious and irritable  Thought process:  Coherent  Thought content:    Logical  Perceptual disturbances:    Normal  Orientation:  Full (Time, Place, and Person)  Attention:  Good  Concentration:  good  Memory:  Immediate  Fund of knowledge:   Good  Insight:    Fair  Judgment:   Intact  Impulse Control:  good    Reported Symptoms: Anxiety, frustration, some depression, stressed,financial stressors in particular.  Risk Assessment: Danger to Self:  No Self-injurious Behavior: No Danger to Others: No Duty to Warn:no Physical Aggression / Violence:No  Access to Firearms a concern: No  Gang Involvement:No   Diagnosis:   ICD-10-CM   1. Mixed bipolar I disorder in partial remission ([place]) F31.77      Plan: will  see patient in approx 2 weeks for continued goal-directed treatment.  Shanon Ace, LCSW

## 2018-05-11 NOTE — Telephone Encounter (Signed)
I have called and lm on vm for pt with no return call. I will hold message he has a follow up appt with Dr. Sharol Given on 05/17/18

## 2018-05-17 ENCOUNTER — Ambulatory Visit (INDEPENDENT_AMBULATORY_CARE_PROVIDER_SITE_OTHER): Payer: Self-pay | Admitting: Orthopedic Surgery

## 2018-05-17 ENCOUNTER — Encounter (INDEPENDENT_AMBULATORY_CARE_PROVIDER_SITE_OTHER): Payer: Self-pay | Admitting: Orthopedic Surgery

## 2018-05-17 ENCOUNTER — Ambulatory Visit (INDEPENDENT_AMBULATORY_CARE_PROVIDER_SITE_OTHER): Payer: BLUE CROSS/BLUE SHIELD | Admitting: Orthopedic Surgery

## 2018-05-17 VITALS — Ht 68.0 in | Wt 168.0 lb

## 2018-05-17 DIAGNOSIS — M1A0710 Idiopathic chronic gout, right ankle and foot, without tophus (tophi): Secondary | ICD-10-CM | POA: Diagnosis not present

## 2018-05-17 DIAGNOSIS — M25571 Pain in right ankle and joints of right foot: Secondary | ICD-10-CM | POA: Diagnosis not present

## 2018-05-17 NOTE — Telephone Encounter (Signed)
Pt is here for appt today Dr. Sharol Given will discuss next steps.

## 2018-05-18 LAB — EXTRA LAV TOP TUBE

## 2018-05-18 LAB — URIC ACID: Uric Acid, Serum: 8.1 mg/dL — ABNORMAL HIGH (ref 4.0–8.0)

## 2018-05-19 ENCOUNTER — Ambulatory Visit (INDEPENDENT_AMBULATORY_CARE_PROVIDER_SITE_OTHER): Payer: BLUE CROSS/BLUE SHIELD | Admitting: Psychiatry

## 2018-05-19 DIAGNOSIS — F3177 Bipolar disorder, in partial remission, most recent episode mixed: Secondary | ICD-10-CM | POA: Insufficient documentation

## 2018-05-19 NOTE — Progress Notes (Signed)
      [facility] Counselor/Therapist Progress Note   Patient ID: Francisco Morgan, MRN: [id number]  Date: 05/19/2018  Timespent: 50 minutes  Treatment Type: Individual  Subjective:  Patient in today reporting symptoms of stress, fatigue, and some anxiety.  Talking almost non-stop, and tangential, mostly re:  his history,  his current work, and other unrelated subject matters.  Seemed to be somewhat aggravated when inquiry was made about goals he had stated when he was first seen in therapy.  Did re-focus briefly on goals and then explained that a large part of his coming here is to vent and say whatever he wanted to talk about.  We talked about this more and agreed about his being able to share what he wanted to, while also keeping his therapeutic goals in focus as they represent the changes he has agreed he wants in life.  Patient's goals revolve around improving his self-care, making better decisions, and as he stated "become more stable emotionally and eventually reaching a level of maturity in keeping with my biological age".  Will see him again within 2 weeks.    Interventions:Solution Focused, Supportive and Reframing  Mental Status Exam:   Appearance:   Casual     Behavior:  Appropriate and Sharing  Motor:  Normal  Speech/Language:   almost non-stop talking  Affect:  Flat  Mood:  somewhat irritable  Thought process:  Tangential  Thought content:    Tangential  Perceptual disturbances:    Normal  Orientation:  Full (Time, Place, and Person)  Attention:  Fair  Concentration:  down slightly  Memory:  Immediate  Fund of knowledge:   Good  Insight:    Fair  Judgment:   Fair  Impulse Control:  fair    Reported Symptoms: anxious, fatigue,   Risk Assessment: Danger to Self:  No Self-injurious Behavior: No Danger to Others: No Duty to Warn:no Physical Aggression / Violence:No  Access to Firearms a concern: No  Gang Involvement:No   Diagnosis:   ICD-10-CM   1. Mixed  bipolar I disorder in partial remission ([place]) F31.77      Plan: Will see patient again in approx 2 weeks to continue goal-directed treatment.    Shanon Ace, LCSW

## 2018-05-20 ENCOUNTER — Telehealth (INDEPENDENT_AMBULATORY_CARE_PROVIDER_SITE_OTHER): Payer: Self-pay

## 2018-05-20 DIAGNOSIS — L4059 Other psoriatic arthropathy: Secondary | ICD-10-CM | POA: Diagnosis not present

## 2018-05-20 DIAGNOSIS — L400 Psoriasis vulgaris: Secondary | ICD-10-CM | POA: Diagnosis not present

## 2018-05-20 DIAGNOSIS — M1A09X0 Idiopathic chronic gout, multiple sites, without tophus (tophi): Secondary | ICD-10-CM | POA: Diagnosis not present

## 2018-05-20 NOTE — Telephone Encounter (Signed)
I called and lm on vm for pt to advise that Dr. Sharol Given reviewed blood work and his uric acid is still elevated. He needs to start gout medication and that we can start that for him or when can refer to his PCP pt is to call and advise what he would like to do. Will hold on to this message pending advisement.

## 2018-05-20 NOTE — Telephone Encounter (Signed)
-----   Message from Newt Minion, MD sent at 05/19/2018  3:55 PM EDT ----- Call patient.  His uric acid is 8.1 still too high he needs to be on gout medication.  We can start this or his medical doctor can manage this.

## 2018-05-21 ENCOUNTER — Encounter (INDEPENDENT_AMBULATORY_CARE_PROVIDER_SITE_OTHER): Payer: Self-pay | Admitting: Orthopedic Surgery

## 2018-05-21 DIAGNOSIS — M25571 Pain in right ankle and joints of right foot: Secondary | ICD-10-CM

## 2018-05-21 DIAGNOSIS — M1A0710 Idiopathic chronic gout, right ankle and foot, without tophus (tophi): Secondary | ICD-10-CM

## 2018-05-21 MED ORDER — METHYLPREDNISOLONE ACETATE 40 MG/ML IJ SUSP
40.0000 mg | INTRAMUSCULAR | Status: AC | PRN
  Administered 2018-05-21: 40 mg via INTRA_ARTICULAR

## 2018-05-21 MED ORDER — LIDOCAINE HCL 1 % IJ SOLN
2.0000 mL | INTRAMUSCULAR | Status: AC | PRN
  Administered 2018-05-21: 2 mL
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
## 2018-05-23 DIAGNOSIS — F909 Attention-deficit hyperactivity disorder, unspecified type: Secondary | ICD-10-CM

## 2018-05-25 NOTE — Telephone Encounter (Signed)
I called and lm on vm for pt to advise of message below. Will hold message pending his advisement.

## 2018-05-28 NOTE — Telephone Encounter (Signed)
I have left several messages to advise the pt of his uric acid level and need for medication to treat. I have been unsuccessful in trying to reach the pt. He has an appt on 06/07/18 do you want me to sign off and discuss results with pt at next office visit?

## 2018-05-28 NOTE — Telephone Encounter (Signed)
Ok will address at follow up

## 2018-06-01 ENCOUNTER — Other Ambulatory Visit: Payer: Self-pay | Admitting: Psychiatry

## 2018-06-04 ENCOUNTER — Encounter: Payer: Self-pay | Admitting: Psychiatry

## 2018-06-04 ENCOUNTER — Ambulatory Visit (INDEPENDENT_AMBULATORY_CARE_PROVIDER_SITE_OTHER): Payer: BLUE CROSS/BLUE SHIELD | Admitting: Psychiatry

## 2018-06-04 ENCOUNTER — Ambulatory Visit: Payer: BLUE CROSS/BLUE SHIELD | Admitting: Psychiatry

## 2018-06-04 DIAGNOSIS — F3177 Bipolar disorder, in partial remission, most recent episode mixed: Secondary | ICD-10-CM | POA: Diagnosis not present

## 2018-06-04 DIAGNOSIS — F319 Bipolar disorder, unspecified: Secondary | ICD-10-CM

## 2018-06-04 DIAGNOSIS — F902 Attention-deficit hyperactivity disorder, combined type: Secondary | ICD-10-CM

## 2018-06-04 DIAGNOSIS — F4310 Post-traumatic stress disorder, unspecified: Secondary | ICD-10-CM

## 2018-06-04 MED ORDER — DIVALPROEX SODIUM 500 MG PO DR TAB: 1500.0000 mg | DELAYED_RELEASE_TABLET | Freq: Every day | ORAL | 0 refills | Status: DC

## 2018-06-04 MED ORDER — METHYLPHENIDATE HCL 10 MG PO TABS: 10.0000 mg | ORAL_TABLET | Freq: Three times a day (TID) | ORAL | 0 refills | Status: DC

## 2018-06-04 MED ORDER — CONCERTA 36 MG PO TBCR: 36.0000 mg | EXTENDED_RELEASE_TABLET | ORAL | 0 refills | Status: DC

## 2018-06-04 NOTE — Progress Notes (Signed)
      [facility] Counselor/Therapist Progress Note   Patient ID: Francisco Morgan, MRN: [id number]  Date: 06/04/2018  Timespent: 58 minutes   Treatment Type: Individual   Reported Symptoms: "generalized anxiety", fatigue   Mental Status Exam:    Appearance:   Casual     Behavior:  Sharing and difficult to get and stay goal-oriented  Motor:  Normal  Speech/Language:   Normal Rate  Affect:  anxious  Mood:  anxious  Thought process:  rambling, avoidant  Thought content:    WNL  Sensory/Perceptual disturbances:    WNL  Orientation:  oriented to person, place, time/date, situation, day of week, month of year and year  Attention:  Fair  Concentration:  Fair  Memory:  WNL  Fund of knowledge:   Good  Insight:    Fair  Judgment:   Fair  Impulse Control:  Fair     Risk Assessment: Danger to Self:  No Self-injurious Behavior: No Danger to Others: No Duty to Warn:no Physical Aggression / Violence:No  Access to Firearms a concern: No  Gang Involvement:No    Subjective: Patient in today reporting symptoms of "generalized anxiety" and fatigue. Talking almost non-stop.  "I want life to not wear me out."   Reports" general malaise" but then adds "I'm a rapid cycle person and that's just going to happen."  Avoidant at times, but did stay focused with me a while in talking about self-care.  States he knows he needs to" take care of himself better including some regular exercise, stretching".  Hard to focus in goal-directed conversation. Efforts made to tie is some of his talk into goals, especially the goal of him becoming a more responsible adult and make decisions that are more sound and in his best interest.  Does respond to this approach for at least a limited time frame.    Interventions: Solution-Oriented/Positive Psychology and Insight-Oriented   Diagnosis:   ICD-10-CM   1. Mixed bipolar I disorder in partial remission ([place]) F31.77      Plan:  Plan to see patient  within 2-3 weeks to continue goal-directed treatment.   Shanon Ace, LCSW
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
## 2018-06-04 NOTE — Patient Instructions (Signed)
Increase Depakote to 3 of 500mg  at night.

## 2018-06-07 ENCOUNTER — Ambulatory Visit (INDEPENDENT_AMBULATORY_CARE_PROVIDER_SITE_OTHER): Payer: Self-pay | Admitting: Orthopedic Surgery

## 2018-06-18 ENCOUNTER — Ambulatory Visit: Payer: BLUE CROSS/BLUE SHIELD | Admitting: Psychiatry

## 2018-07-01 ENCOUNTER — Encounter (INDEPENDENT_AMBULATORY_CARE_PROVIDER_SITE_OTHER): Payer: Self-pay | Admitting: Orthopedic Surgery

## 2018-07-01 ENCOUNTER — Ambulatory Visit (INDEPENDENT_AMBULATORY_CARE_PROVIDER_SITE_OTHER): Payer: BLUE CROSS/BLUE SHIELD | Admitting: Orthopedic Surgery

## 2018-07-01 VITALS — Ht 68.0 in | Wt 168.0 lb

## 2018-07-01 DIAGNOSIS — M25871 Other specified joint disorders, right ankle and foot: Secondary | ICD-10-CM | POA: Diagnosis not present

## 2018-07-02 ENCOUNTER — Encounter (INDEPENDENT_AMBULATORY_CARE_PROVIDER_SITE_OTHER): Payer: Self-pay | Admitting: Orthopedic Surgery

## 2018-07-02 ENCOUNTER — Ambulatory Visit: Payer: BLUE CROSS/BLUE SHIELD | Admitting: Psychiatry

## 2018-07-02 DIAGNOSIS — M25871 Other specified joint disorders, right ankle and foot: Secondary | ICD-10-CM

## 2018-07-02 MED ORDER — METHYLPREDNISOLONE ACETATE 40 MG/ML IJ SUSP
40.0000 mg | INTRAMUSCULAR | Status: AC | PRN
  Administered 2018-07-02: 40 mg via INTRA_ARTICULAR

## 2018-07-02 MED ORDER — LIDOCAINE HCL 1 % IJ SOLN
2.0000 mL | INTRAMUSCULAR | Status: AC | PRN
  Administered 2018-07-02: 2 mL
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
## 2018-07-15 ENCOUNTER — Encounter: Payer: Self-pay | Admitting: Internal Medicine

## 2018-07-15 ENCOUNTER — Ambulatory Visit (INDEPENDENT_AMBULATORY_CARE_PROVIDER_SITE_OTHER): Payer: BLUE CROSS/BLUE SHIELD | Admitting: Internal Medicine

## 2018-07-15 VITALS — BP 134/82 | HR 67 | Temp 97.6°F | Ht 68.0 in | Wt 173.0 lb

## 2018-07-15 DIAGNOSIS — F909 Attention-deficit hyperactivity disorder, unspecified type: Secondary | ICD-10-CM | POA: Diagnosis not present

## 2018-07-15 DIAGNOSIS — L409 Psoriasis, unspecified: Secondary | ICD-10-CM

## 2018-07-15 DIAGNOSIS — F3177 Bipolar disorder, in partial remission, most recent episode mixed: Secondary | ICD-10-CM

## 2018-07-15 DIAGNOSIS — K219 Gastro-esophageal reflux disease without esophagitis: Secondary | ICD-10-CM | POA: Diagnosis not present

## 2018-07-15 DIAGNOSIS — K222 Esophageal obstruction: Secondary | ICD-10-CM

## 2018-07-15 DIAGNOSIS — Z23 Encounter for immunization: Secondary | ICD-10-CM

## 2018-07-15 DIAGNOSIS — T675XXD Heat exhaustion, unspecified, subsequent encounter: Secondary | ICD-10-CM | POA: Diagnosis not present

## 2018-07-15 DIAGNOSIS — L4050 Arthropathic psoriasis, unspecified: Secondary | ICD-10-CM

## 2018-07-15 NOTE — Assessment & Plan Note (Signed)
On Nexium 
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
## 2018-07-15 NOTE — Assessment & Plan Note (Signed)
Dr Clovis Pu

## 2018-07-15 NOTE — Assessment & Plan Note (Signed)
Dr Clovis Pu Depakote Lamictal

## 2018-07-15 NOTE — Assessment & Plan Note (Signed)
No relapse - using a hydration pack at work now

## 2018-07-15 NOTE — Assessment & Plan Note (Signed)
Humira, Celebrex

## 2018-07-15 NOTE — Addendum Note (Signed)
Addended by: Karren Cobble on: 07/15/2018 11:31 AM   Modules accepted: Orders

## 2018-07-15 NOTE — Assessment & Plan Note (Signed)
Dr Amil Amen On Humira

## 2018-07-16 ENCOUNTER — Ambulatory Visit (INDEPENDENT_AMBULATORY_CARE_PROVIDER_SITE_OTHER): Payer: BLUE CROSS/BLUE SHIELD | Admitting: Psychiatry

## 2018-07-16 DIAGNOSIS — F319 Bipolar disorder, unspecified: Secondary | ICD-10-CM | POA: Diagnosis not present

## 2018-07-16 NOTE — Progress Notes (Signed)
      [facility] Counselor/Therapist Progress Note  Patient ID: Francisco Morgan, MRN: [id number],    Date: 07/16/2018  Time Spent: 60 minutes  Treatment Type: Individual Therapy  Reported Symptoms:  Lower motivation, irritable, low level anxiety  Mental Status Exam:  Appearance:   Casual     Behavior:  Appropriate and Sharing  Motor:  Normal  Speech/Language:   Talking is ongoing but not really pressured  Affect:  Congruent  Mood:  Slight anxiety and depression, irritable and frustrated with other people  Thought process:  normal  Thought content:    WNL  Sensory/Perceptual disturbances:    WNL  Orientation:  oriented to person, place, time/date, situation, day of week, month of year and year  Attention:  Good  Concentration:  Fair  Memory:  [place] of knowledge:   Good  Insight:    Fair  Judgment:   Good  Impulse Control:  Fair   Risk Assessment: Danger to Self:  No Self-injurious Behavior: No Danger to Others: No Duty to Warn:no Physical Aggression / Violence:No  Access to Firearms a concern: No  Gang Involvement:No   Subjective: Patient in today with lower level depression and anxiety, irritability, and frustration.  Freely shares a lot of his frustration with others, particularly family.  States "they always ask me if I'm ok" instead of "how are you doing".  Feels they have "an expectation of failure".  Is continuing to sometimes work quite irregular hours versus maintaining a more regular healthy schedule, and maintains this is what works best for him.  When asked how it works best for him, there was no response. Difficulty committing to goals or sense of direction moving forward.  Reports and does seem to benefit by being able to freely vent his concerns, feelings, and frustrations.    Interventions: Solution-Oriented/Positive Psychology and Ego-Supportive  Diagnosis:   ICD-10-CM   1. Bipolar affective disorder, rapid cycling ([place]) F31.9     Plan: Patient  to follow through to consider adjusting his sleep and work schedule although he still insists that he works best under his preferred "more flexible" schedule.  Encouraged good-self care (mental, physical, and emotional). To stay on meds as prescribed.  Shanon Ace, LCSW

## 2018-07-22 DIAGNOSIS — M1990 Unspecified osteoarthritis, unspecified site: Secondary | ICD-10-CM | POA: Insufficient documentation

## 2018-07-22 DIAGNOSIS — W458XXA Other foreign body or object entering through skin, initial encounter: Secondary | ICD-10-CM | POA: Diagnosis not present

## 2018-07-22 DIAGNOSIS — S6991XA Unspecified injury of right wrist, hand and finger(s), initial encounter: Secondary | ICD-10-CM | POA: Diagnosis not present

## 2018-07-22 DIAGNOSIS — F319 Bipolar disorder, unspecified: Secondary | ICD-10-CM | POA: Insufficient documentation

## 2018-08-02 ENCOUNTER — Ambulatory Visit (INDEPENDENT_AMBULATORY_CARE_PROVIDER_SITE_OTHER): Payer: BLUE CROSS/BLUE SHIELD | Admitting: Psychiatry

## 2018-08-02 DIAGNOSIS — F319 Bipolar disorder, unspecified: Secondary | ICD-10-CM

## 2018-08-02 NOTE — Progress Notes (Signed)
      [facility] Counselor/Therapist Progress Note  Patient ID: TABARI VOLKERT, MRN: [id number],    Date: 08/02/2018  Time Spent:   58 minutes  Treatment Type: Individual Therapy  Reported Symptoms:  Decreased sleep past week ("had been traveling and havent settled back in to my rhythm"), some anxiety and irritable, "things mostly fine but some angst at times depending on what's happening"  Mental Status Exam:  Appearance:   Casual     Behavior:  Appropriate and Sharing  Motor:  Normal  Speech/Language:   Normal Rate  Affect:  Appropriate  Mood:  anxious  Thought process:  normal  Thought content:    WNL  Sensory/Perceptual disturbances:    WNL  Orientation:  oriented to person, place, time/date, situation, day of week, month of year and year  Attention:  Good  Concentration:  Good  Memory:  WNL  Fund of knowledge:   Good  Insight:    Fair  Judgment:   Fair  Impulse Control:  Fair   Risk Assessment: Danger to Self:  No Self-injurious Behavior: No Danger to Others: No Duty to Warn:no Physical Aggression / Violence:No  Access to Firearms a concern: No  Gang Involvement:No    Subjective:  Patient in today with reported moderate level of anxiety and decreased sleep this past week which he attributed to traveling some over the holidays and not being fully settled back in at home.  Also reports some irritability when things don't go as planned.  Ended up with a frank discussion with patient about process of treatment which he initiated.  States he feels he is better than 3-5 yrs ago as he self-reports is evidenced by "keeping a job, few if any high risk behaviors, ongoing self-evaluation and managing myself."  Feels that his" life is less based on symptoms and that he has fuller autonomy".  States "today has been better", referring to our session.  Interventions: Solution-Oriented/Positive Psychology and Ego-Supportive  Diagnosis:   ICD-10-CM   1. Bipolar affective  disorder, rapid cycling ([place]) F31.9     Plan:  Patient to see Psychiatrist this week and make decision re: treatment.  Has appt within 2 wks.  Shanon Ace, LCSW

## 2018-08-05 ENCOUNTER — Encounter: Payer: Self-pay | Admitting: Psychiatry

## 2018-08-05 ENCOUNTER — Ambulatory Visit: Payer: BLUE CROSS/BLUE SHIELD | Admitting: Psychiatry

## 2018-08-05 VITALS — BP 123/75 | HR 75

## 2018-08-05 DIAGNOSIS — F319 Bipolar disorder, unspecified: Secondary | ICD-10-CM

## 2018-08-05 DIAGNOSIS — F902 Attention-deficit hyperactivity disorder, combined type: Secondary | ICD-10-CM

## 2018-08-05 DIAGNOSIS — F4310 Post-traumatic stress disorder, unspecified: Secondary | ICD-10-CM | POA: Diagnosis not present

## 2018-08-05 MED ORDER — DIVALPROEX SODIUM 500 MG PO DR TAB: 1500.0000 mg | DELAYED_RELEASE_TABLET | Freq: Every day | ORAL | 0 refills | Status: DC

## 2018-08-05 MED ORDER — LAMOTRIGINE 200 MG PO TABS: 200.0000 mg | ORAL_TABLET | Freq: Every day | ORAL | 0 refills | Status: DC

## 2018-08-05 MED ORDER — DIVALPROEX SODIUM ER 250 MG PO TB24: 250.0000 mg | ORAL_TABLET | Freq: Every day | ORAL | 0 refills | Status: DC

## 2018-08-05 MED ORDER — CONCERTA 36 MG PO TBCR: 36.0000 mg | EXTENDED_RELEASE_TABLET | ORAL | 0 refills | Status: DC

## 2018-08-05 MED ORDER — METHYLPHENIDATE HCL 10 MG PO TABS: 10.0000 mg | ORAL_TABLET | Freq: Three times a day (TID) | ORAL | 0 refills | Status: DC

## 2018-08-05 MED ORDER — METHYLPHENIDATE HCL ER (OSM) 36 MG PO TBCR: 36.0000 mg | EXTENDED_RELEASE_TABLET | Freq: Every day | ORAL | 0 refills | Status: DC
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
## 2018-08-17 ENCOUNTER — Ambulatory Visit: Payer: BLUE CROSS/BLUE SHIELD | Admitting: Psychiatry

## 2018-08-18 ENCOUNTER — Ambulatory Visit: Payer: BLUE CROSS/BLUE SHIELD | Admitting: Psychiatry

## 2018-08-23 ENCOUNTER — Ambulatory Visit: Payer: BLUE CROSS/BLUE SHIELD | Admitting: Mental Health

## 2018-08-23 DIAGNOSIS — F319 Bipolar disorder, unspecified: Secondary | ICD-10-CM | POA: Diagnosis not present
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
## 2018-08-24 ENCOUNTER — Other Ambulatory Visit: Payer: Self-pay | Admitting: Psychiatry

## 2018-08-31 ENCOUNTER — Telehealth: Payer: Self-pay | Admitting: Internal Medicine

## 2018-08-31 NOTE — Telephone Encounter (Signed)
Copied from [place] [phone]. Topic: General - Other >> Aug 31, 2018  3:17 PM Keene Breath wrote: Reason for CRM: Patient called to speak with the nurse or doctor regarding his psoriasis.  He wants to know if he should come in or if the doctor could call in a prescription to clear it up.  Please advise and call the patient back.  CB# [phone]

## 2018-09-02 NOTE — Telephone Encounter (Signed)
Pt took his Humira medication and does not need anything from [place] at this time.

## 2018-09-03 ENCOUNTER — Other Ambulatory Visit: Payer: Self-pay | Admitting: Internal Medicine

## 2018-09-10 ENCOUNTER — Ambulatory Visit: Payer: BLUE CROSS/BLUE SHIELD | Admitting: Mental Health

## 2018-09-10 DIAGNOSIS — F319 Bipolar disorder, unspecified: Secondary | ICD-10-CM
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
## 2018-09-15 ENCOUNTER — Ambulatory Visit: Payer: BLUE CROSS/BLUE SHIELD | Admitting: Psychiatry

## 2018-09-15 ENCOUNTER — Encounter: Payer: Self-pay | Admitting: Psychiatry

## 2018-09-15 VITALS — BP 152/92 | HR 95 | Wt 174.0 lb

## 2018-09-15 DIAGNOSIS — F4310 Post-traumatic stress disorder, unspecified: Secondary | ICD-10-CM

## 2018-09-15 DIAGNOSIS — F902 Attention-deficit hyperactivity disorder, combined type: Secondary | ICD-10-CM

## 2018-09-15 DIAGNOSIS — F319 Bipolar disorder, unspecified: Secondary | ICD-10-CM | POA: Diagnosis not present

## 2018-09-15 MED ORDER — METHYLPHENIDATE HCL 10 MG PO TABS: 10.0000 mg | ORAL_TABLET | Freq: Three times a day (TID) | ORAL | 0 refills | Status: DC

## 2018-09-15 MED ORDER — METHYLPHENIDATE HCL ER (OSM) 36 MG PO TBCR: 36.0000 mg | EXTENDED_RELEASE_TABLET | Freq: Every day | ORAL | 0 refills | Status: DC

## 2018-09-15 MED ORDER — CONCERTA 36 MG PO TBCR: 36.0000 mg | EXTENDED_RELEASE_TABLET | ORAL | 0 refills | Status: DC
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
## 2018-09-20 DIAGNOSIS — L4059 Other psoriatic arthropathy: Secondary | ICD-10-CM | POA: Diagnosis not present

## 2018-09-20 DIAGNOSIS — L400 Psoriasis vulgaris: Secondary | ICD-10-CM | POA: Diagnosis not present

## 2018-09-20 DIAGNOSIS — Z1589 Genetic susceptibility to other disease: Secondary | ICD-10-CM | POA: Diagnosis not present

## 2018-09-20 DIAGNOSIS — M1A09X0 Idiopathic chronic gout, multiple sites, without tophus (tophi): Secondary | ICD-10-CM | POA: Diagnosis not present

## 2018-09-21 ENCOUNTER — Ambulatory Visit: Payer: BLUE CROSS/BLUE SHIELD | Admitting: Mental Health

## 2018-10-05 ENCOUNTER — Ambulatory Visit: Payer: BLUE CROSS/BLUE SHIELD | Admitting: Mental Health

## 2018-10-05 DIAGNOSIS — F319 Bipolar disorder, unspecified: Secondary | ICD-10-CM
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
## 2018-10-13 ENCOUNTER — Other Ambulatory Visit: Payer: Self-pay | Admitting: Internal Medicine

## 2018-10-13 DIAGNOSIS — L409 Psoriasis, unspecified: Secondary | ICD-10-CM | POA: Diagnosis not present

## 2018-10-13 MED ORDER — AZITHROMYCIN 250 MG PO TABS: ORAL_TABLET | ORAL | 0 refills | Status: DC

## 2018-10-19 ENCOUNTER — Ambulatory Visit: Payer: BLUE CROSS/BLUE SHIELD | Admitting: Psychiatry

## 2018-10-20 ENCOUNTER — Ambulatory Visit: Payer: BLUE CROSS/BLUE SHIELD | Admitting: Mental Health

## 2018-10-27 DIAGNOSIS — M7752 Other enthesopathy of left foot: Secondary | ICD-10-CM | POA: Diagnosis not present

## 2018-10-27 DIAGNOSIS — M21621 Bunionette of right foot: Secondary | ICD-10-CM | POA: Diagnosis not present

## 2018-10-27 DIAGNOSIS — M21962 Unspecified acquired deformity of left lower leg: Secondary | ICD-10-CM | POA: Diagnosis not present

## 2018-10-27 DIAGNOSIS — M21622 Bunionette of left foot: Secondary | ICD-10-CM | POA: Diagnosis not present

## 2018-10-27 DIAGNOSIS — M21961 Unspecified acquired deformity of right lower leg: Secondary | ICD-10-CM | POA: Diagnosis not present

## 2018-10-27 DIAGNOSIS — M205X2 Other deformities of toe(s) (acquired), left foot: Secondary | ICD-10-CM | POA: Diagnosis not present

## 2018-10-27 DIAGNOSIS — M79672 Pain in left foot: Secondary | ICD-10-CM | POA: Diagnosis not present

## 2018-10-29 ENCOUNTER — Other Ambulatory Visit: Payer: Self-pay | Admitting: Psychiatry

## 2018-11-01 NOTE — Telephone Encounter (Signed)
Ok to call in

## 2018-11-02 IMAGING — DX DG CERVICAL SPINE COMPLETE 4+V
5 series · 5 of 5 positions shown · non-contrast
Comparison: None.

CLINICAL DATA: Cervicalgia radiating into shoulders

EXAM:
CERVICAL SPINE - COMPLETE 4+ VIEW

[c-spine lat]
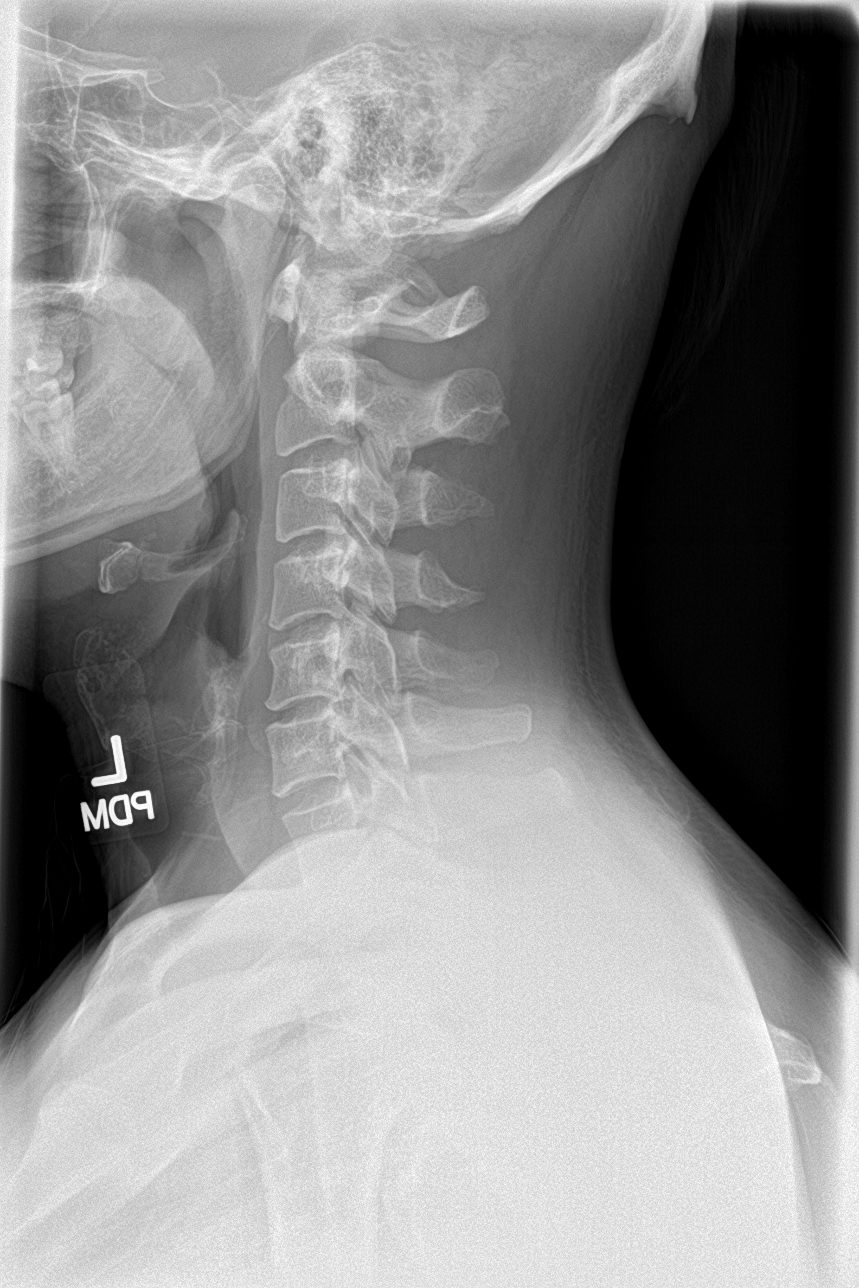

[c-spine obl (1 of 2)]
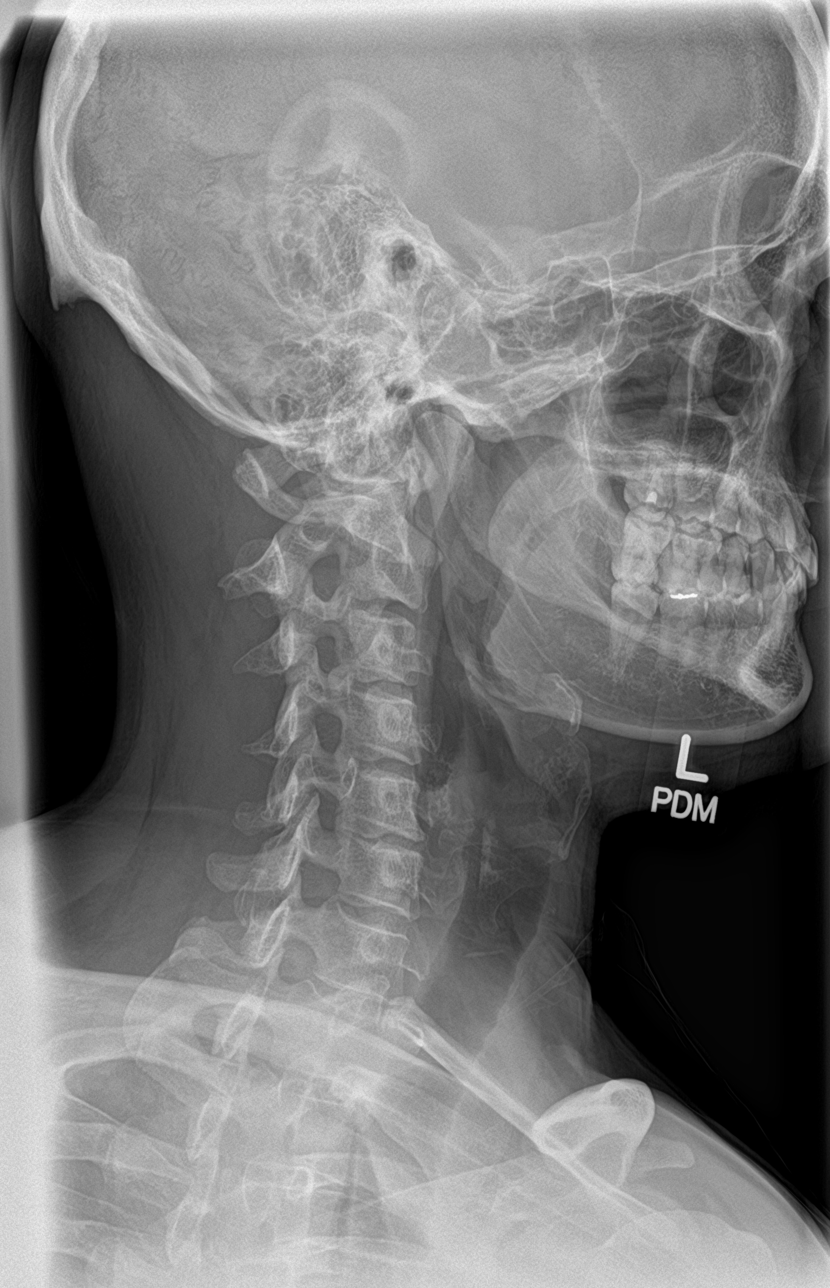

[c-spine obl (2 of 2)]
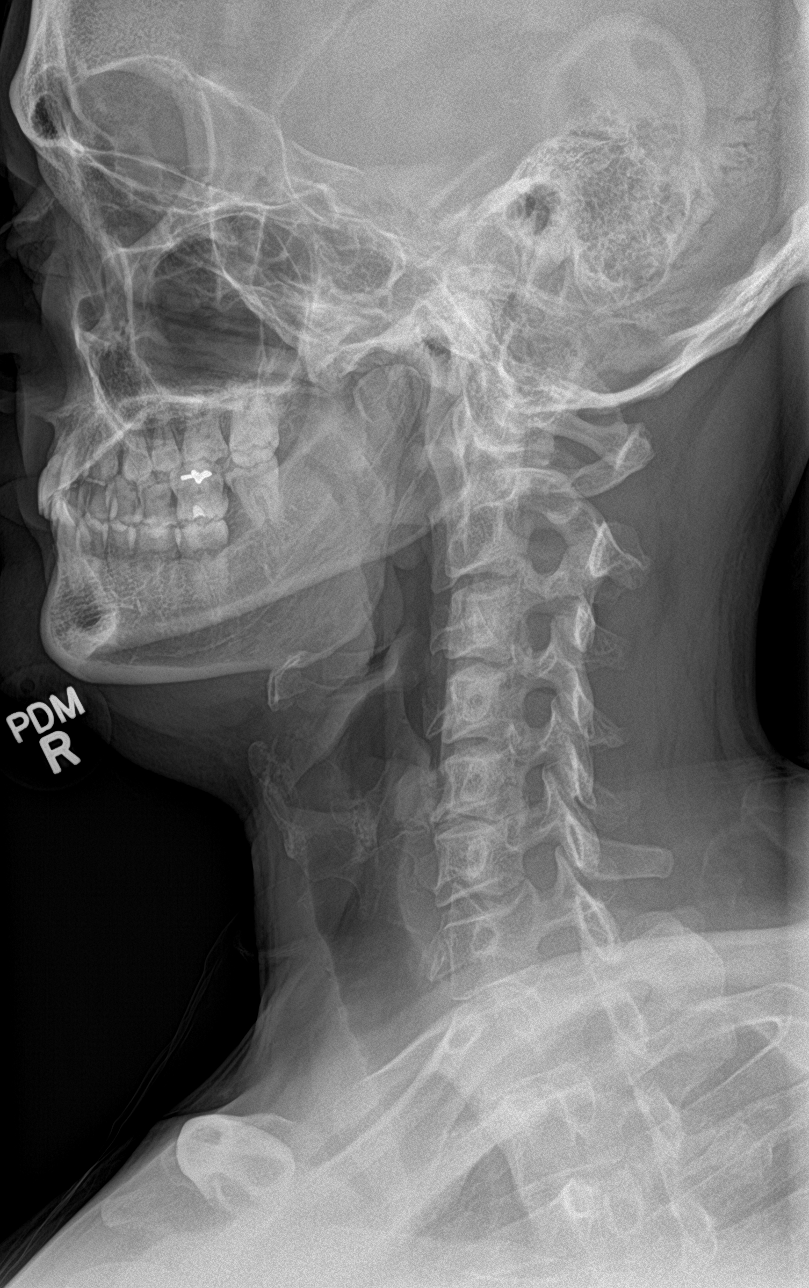

[c-spine ap]
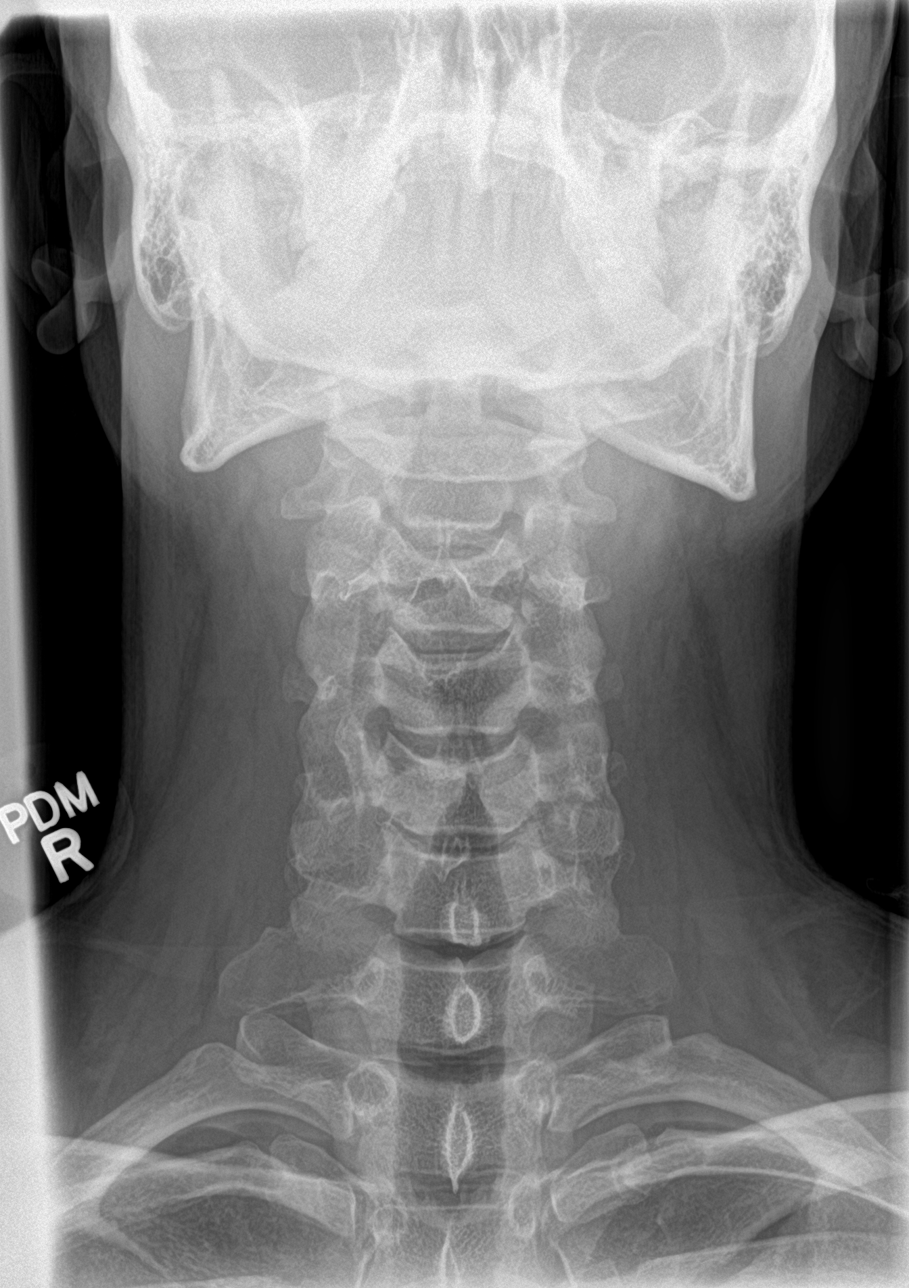

[c-spine open mouth]
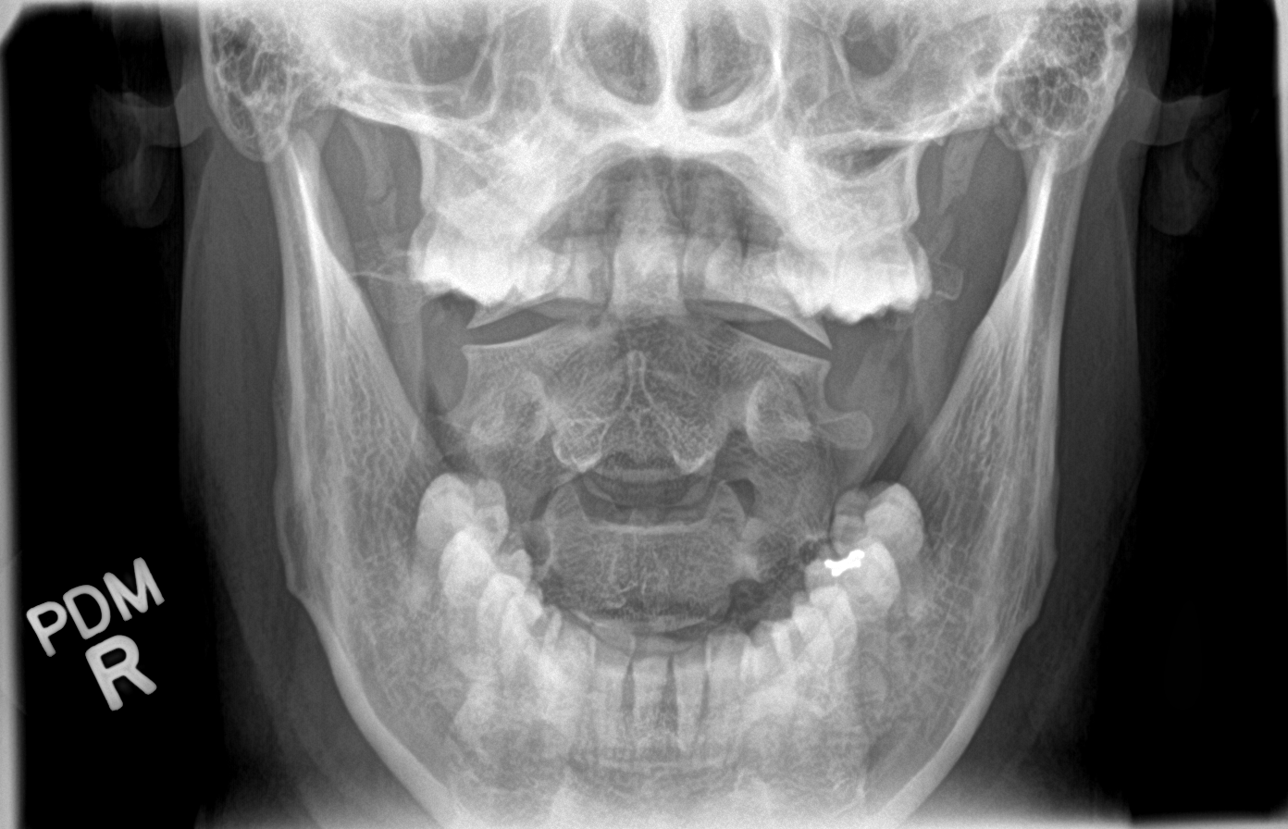

[5 of 5 positions shown; findings below may reference images not displayed]

FINDINGS: Frontal, lateral, open-mouth odontoid, and bilateral oblique views
were obtained. There is no fracture or spondylolisthesis.
Prevertebral soft tissues and predental space regions are normal.
There is slight disc space narrowing at C5-6. The disc spaces appear
normal. There is no appreciable exit foraminal narrowing on the
oblique views.
IMPRESSION: Slight disc space narrowing C5-6. Other disc spaces appear
unremarkable. No fracture or spondylolisthesis.

## 2018-11-23 ENCOUNTER — Other Ambulatory Visit: Payer: Self-pay | Admitting: Psychiatry

## 2018-11-23 DIAGNOSIS — F902 Attention-deficit hyperactivity disorder, combined type: Secondary | ICD-10-CM

## 2018-11-24 NOTE — Telephone Encounter (Signed)
appt 05/05 Last fill 04/04

## 2018-11-30 ENCOUNTER — Encounter: Payer: Self-pay | Admitting: Psychiatry

## 2018-11-30 ENCOUNTER — Other Ambulatory Visit: Payer: Self-pay

## 2018-11-30 ENCOUNTER — Ambulatory Visit: Payer: BLUE CROSS/BLUE SHIELD | Admitting: Psychiatry

## 2018-11-30 DIAGNOSIS — F310 Bipolar disorder, current episode hypomanic: Secondary | ICD-10-CM

## 2018-11-30 DIAGNOSIS — F4310 Post-traumatic stress disorder, unspecified: Secondary | ICD-10-CM

## 2018-11-30 DIAGNOSIS — F319 Bipolar disorder, unspecified: Secondary | ICD-10-CM | POA: Diagnosis not present

## 2018-11-30 DIAGNOSIS — F902 Attention-deficit hyperactivity disorder, combined type: Secondary | ICD-10-CM

## 2018-11-30 MED ORDER — LAMOTRIGINE 200 MG PO TABS: 200.0000 mg | ORAL_TABLET | Freq: Every day | ORAL | 0 refills | Status: DC

## 2018-11-30 MED ORDER — DIVALPROEX SODIUM 500 MG PO DR TAB: 1000.0000 mg | DELAYED_RELEASE_TABLET | Freq: Every day | ORAL | 0 refills | Status: DC

## 2018-11-30 MED ORDER — DIVALPROEX SODIUM ER 250 MG PO TB24: 250.0000 mg | ORAL_TABLET | Freq: Every day | ORAL | 0 refills | Status: DC
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
## 2018-12-02 ENCOUNTER — Other Ambulatory Visit: Payer: Self-pay

## 2018-12-02 ENCOUNTER — Ambulatory Visit (INDEPENDENT_AMBULATORY_CARE_PROVIDER_SITE_OTHER): Payer: BLUE CROSS/BLUE SHIELD | Admitting: Mental Health

## 2018-12-02 DIAGNOSIS — F310 Bipolar disorder, current episode hypomanic: Secondary | ICD-10-CM | POA: Diagnosis not present
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
## 2018-12-09 ENCOUNTER — Other Ambulatory Visit: Payer: Self-pay

## 2018-12-09 ENCOUNTER — Encounter: Payer: Self-pay | Admitting: Orthopedic Surgery

## 2018-12-09 ENCOUNTER — Ambulatory Visit: Payer: BLUE CROSS/BLUE SHIELD | Admitting: Orthopedic Surgery

## 2018-12-09 VITALS — Ht 68.0 in | Wt 174.0 lb

## 2018-12-09 DIAGNOSIS — M25871 Other specified joint disorders, right ankle and foot: Secondary | ICD-10-CM

## 2018-12-09 DIAGNOSIS — M21622 Bunionette of left foot: Secondary | ICD-10-CM | POA: Diagnosis not present

## 2018-12-09 DIAGNOSIS — M205X2 Other deformities of toe(s) (acquired), left foot: Secondary | ICD-10-CM | POA: Diagnosis not present

## 2018-12-09 DIAGNOSIS — M21962 Unspecified acquired deformity of left lower leg: Secondary | ICD-10-CM | POA: Diagnosis not present

## 2018-12-09 DIAGNOSIS — M21621 Bunionette of right foot: Secondary | ICD-10-CM | POA: Diagnosis not present

## 2018-12-13 ENCOUNTER — Encounter: Payer: Self-pay | Admitting: Orthopedic Surgery

## 2018-12-13 DIAGNOSIS — M25871 Other specified joint disorders, right ankle and foot: Secondary | ICD-10-CM | POA: Diagnosis not present

## 2018-12-13 MED ORDER — LIDOCAINE HCL 1 % IJ SOLN
2.0000 mL | INTRAMUSCULAR | Status: AC | PRN
  Administered 2018-12-13: 2 mL

## 2018-12-13 MED ORDER — METHYLPREDNISOLONE ACETATE 40 MG/ML IJ SUSP
40.0000 mg | INTRAMUSCULAR | Status: AC | PRN
  Administered 2018-12-13: 40 mg via INTRA_ARTICULAR
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
## 2018-12-16 ENCOUNTER — Ambulatory Visit (INDEPENDENT_AMBULATORY_CARE_PROVIDER_SITE_OTHER): Payer: BLUE CROSS/BLUE SHIELD | Admitting: Mental Health

## 2018-12-16 ENCOUNTER — Other Ambulatory Visit: Payer: Self-pay

## 2018-12-16 DIAGNOSIS — F310 Bipolar disorder, current episode hypomanic: Secondary | ICD-10-CM

## 2018-12-16 NOTE — Progress Notes (Signed)
Psychotherapy Note  Name: Francisco Morgan Date: 12/16/2018 MRN: [id number] DOB: 23-May-1976 PCP: [name], MD  Time spent: 55 minutes  Treatment: Individual therapy  Virtual Visit via Telephone Note Connected with patient by a video enabled telemedicine/telehealth application or telephone, with their informed consent, and verified patient privacy and that I am speaking with the correct person using two identifiers. I discussed the limitations, risks, security and privacy concerns of performing psychotherapy and management service by telephone and the availability of in person appointments. I also discussed with the patient that there may be a patient responsible charge related to this service. The patient expressed understanding and agreed to proceed. I discussed the treatment planning with the patient. The patient was provided an opportunity to ask questions and all were answered. The patient agreed with the plan and demonstrated an understanding of the instructions. The patient was advised to call  our office if  symptoms worsen or feel they are in a crisis state and need immediate contact.   Therapist Location: home Patient Location: home  Mental Status Exam: Appearance:   UTA-unable to assess  Behavior:  UTA  Motor:  UTA  Speech/Language:   Normal Rate  Affect:  UTA  Mood:  anxious  Thought process:  normal  Thought content:   WNL  Sensory/Perceptual disturbances:   WNL  Orientation:  x4  Attention:  Good  Concentration:  Good  Memory:  WNL  Fund of knowledge:   Good  Insight:   Fair  Judgment:   Fair  Impulse Control:  Fair   Reported Symptoms:  Decreased sleep intermittently, some anxiety and irritability  Risk Assessment: Danger to Self:  No Self-injurious Behavior: No Danger to Others: No Duty to Warn:no Physical Aggression / Violence:No  Access to Firearms a concern: No  Gang Involvement:No  Patient / guardian was educated about  steps to take if suicide or homicide risk level increases between visits: yes While future psychiatric events cannot be accurately predicted, the patient does not currently require acute inpatient psychiatric care and does not currently meet [facility] involuntary commitment criteria.  Medications: Current Outpatient Medications  Medication Sig Dispense Refill  . b complex vitamins tablet Take 1 tablet by mouth daily. 100 tablet 3  . celecoxib (CELEBREX) 200 MG capsule TAKE (1) CAPSULE TWICE DAILY. 60 capsule 3  . Cholecalciferol (VITAMIN D) 2000 units CAPS 1 daily 100 capsule 3  . Cholecalciferol (VITAMIN D3) 50000 units CAPS Take 1 capsule by mouth once a week. 8 capsule 0  . clonazePAM (KLONOPIN) 0.5 MG tablet TAKE 1 TABLET THREE TIMES DAILY AS NEEDED FOR ANXIETY. 60 tablet 0  . CONCERTA 36 MG CR tablet TAKE 1 TABLET IN THE MORNING. 30 tablet 0  . divalproex (DEPAKOTE ER) 250 MG 24 hr tablet Take 1 tablet (250 mg total) by mouth at bedtime. 90 tablet 0  . divalproex (DEPAKOTE) 500 MG DR tablet Take 2 tablets (1,000 mg total) by mouth at bedtime. 180 tablet 0  . esomeprazole (NEXIUM) 40 MG capsule Take 1 capsule (40 mg total) by mouth daily before breakfast. 90 capsule 4  . HUMIRA PEN 40 MG/0.4ML PNKT Inject 40 mg into the muscle every 14 (fourteen) days.   1  . hydrocortisone 2.5 % ointment     . lamoTRIgine (LAMICTAL) 200 MG tablet Take 1 tablet (200 mg total) by mouth daily. 90 tablet 0  . methylphenidate (CONCERTA) 36 MG PO CR tablet Take 1 tablet (36 mg total) by mouth daily.  30 tablet 0  . methylphenidate (RITALIN) 10 MG tablet Take 1 tablet (10 mg total) by mouth 3 (three) times daily with meals. 90 tablet 0  . methylphenidate (RITALIN) 10 MG tablet TAKE 1 TABLET 3 TIMES DAILY WITH MEALS. 90 tablet 0   No current facility-administered medications for this visit.     Allergies  Allergen Reactions  . Nsaids Other (See Comments)    Constipation   Subjective: patient engaged in  teletherapy session. Shared past experiences, growing up in [place], [place]. Shared he has a strong sense of smell, at times, this can affect him emotionally. Reported a hx of having shingles, occurring in his eye a few years ago. Shared how he continues to cope w/ a depressed mood. 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  Has had less relational strain with him since our last visit.  He shared the physical told his body is taken over the years completing various projects.   Ways to cope, care for himself were explored /reviewed.  Interventions: CBT, supportive therapy  Diagnoses:    ICD-10-CM   1. Bipolar I disorder, current or most recent episode hypomanic ([place]) F31.0     Plan: 1.  Patient to continue to engage in individual counseling 2-4 times a month or as needed. 2.  Patient to identify and apply CBT, coping skills learned in session to decrease depression and anxiety symptoms. 3.  Patient to contact this office, go to the local ED or call 911 if a crisis or emergency develops between visits.   Anson [place], [facility]

## 2018-12-17 ENCOUNTER — Other Ambulatory Visit: Payer: Self-pay | Admitting: Psychiatry

## 2018-12-24 ENCOUNTER — Other Ambulatory Visit: Payer: Self-pay | Admitting: Psychiatry

## 2018-12-24 DIAGNOSIS — F902 Attention-deficit hyperactivity disorder, combined type: Secondary | ICD-10-CM

## 2018-12-24 NOTE — Telephone Encounter (Signed)
Last fill 05/02 Scheduled 7/22

## 2018-12-30 ENCOUNTER — Other Ambulatory Visit: Payer: Self-pay

## 2018-12-30 ENCOUNTER — Ambulatory Visit: Payer: BC Managed Care – PPO | Admitting: Mental Health

## 2018-12-30 DIAGNOSIS — F310 Bipolar disorder, current episode hypomanic: Secondary | ICD-10-CM

## 2018-12-30 NOTE — Progress Notes (Signed)
Psychotherapy Note  Name: GINA COSTILLA Date: 01/11/2019 MRN: [id number] DOB: 15-Mar-1976 PCP: [name], MD  Time spent: 50 minutes  Treatment: Individual therapy  Mental Status Exam: Appearance:   casual  Behavior:  appropriate  Motor:  WNL  Speech/Language:   Clear, coherent  Affect:  Full range  Mood:  anxious  Thought process:  normal  Thought content:   WNL  Sensory/Perceptual disturbances:   WNL  Orientation:  x4  Attention:  Good  Concentration:  Good  Memory:  WNL  Fund of knowledge:   Good  Insight:   Fair  Judgment:   Fair  Impulse Control:  Fair   Reported Symptoms:  Decreased sleep intermittently, some anxiety and irritability  Risk Assessment: Danger to Self:  No Self-injurious Behavior: No Danger to Others: No Duty to Warn:no Physical Aggression / Violence:No  Access to Firearms a concern: No  Gang Involvement:No  Patient / guardian was educated about steps to take if suicide or homicide risk level increases between visits: yes While future psychiatric events cannot be accurately predicted, the patient does not currently require acute inpatient psychiatric care and does not currently meet [facility] involuntary commitment criteria.  Medications: Current Outpatient Medications  Medication Sig Dispense Refill  . b complex vitamins tablet Take 1 tablet by mouth daily. 100 tablet 3  . celecoxib (CELEBREX) 200 MG capsule TAKE (1) CAPSULE TWICE DAILY. 60 capsule 3  . Cholecalciferol (VITAMIN D) 2000 units CAPS 1 daily 100 capsule 3  . Cholecalciferol (VITAMIN D3) 50000 units CAPS Take 1 capsule by mouth once a week. 8 capsule 0  . clonazePAM (KLONOPIN) 0.5 MG tablet TAKE 1 TABLET THREE TIMES DAILY AS NEEDED FOR ANXIETY. 60 tablet 0  . CONCERTA 36 MG CR tablet TAKE 1 TABLET IN THE MORNING. 30 tablet 0  . divalproex (DEPAKOTE ER) 250 MG 24 hr tablet Take 1 tablet (250 mg total) by mouth at bedtime. 90 tablet 0  .  divalproex (DEPAKOTE) 500 MG DR tablet Take 2 tablets (1,000 mg total) by mouth at bedtime. 180 tablet 0  . esomeprazole (NEXIUM) 40 MG capsule Take 1 capsule (40 mg total) by mouth daily before breakfast. 90 capsule 4  . HUMIRA PEN 40 MG/0.4ML PNKT Inject 40 mg into the muscle every 14 (fourteen) days.   1  . hydrocortisone 2.5 % ointment     . lamoTRIgine (LAMICTAL) 200 MG tablet Take 1 tablet (200 mg total) by mouth daily. 90 tablet 0  . methylphenidate (CONCERTA) 36 MG PO CR tablet Take 1 tablet (36 mg total) by mouth daily. 30 tablet 0  . methylphenidate (RITALIN) 10 MG tablet Take 1 tablet (10 mg total) by mouth 3 (three) times daily with meals. 90 tablet 0  . methylphenidate (RITALIN) 10 MG tablet TAKE 1 TABLET 3 TIMES DAILY WITH MEALS. 90 tablet 0  . methylphenidate (RITALIN) 10 MG tablet TAKE 1 TABLET 3 TIMES DAILY WITH MEALS. 90 tablet 0   No current facility-administered medications for this visit.     Allergies  Allergen Reactions  . Nsaids Other (See Comments)    Constipation   Subjective: Patient engaged in session in no distress.  Discussed progress since our last visit.  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  Continue to share some relevant history related to coping with his diagnosis over the years, subsequent relational issues with his parents at times and his relationship  with his sister.  He shared also his history of clinical treatment with past psychiatrist years ago, felt he was overmedicated at times.  Feels his current medications are helpful.  Patient needed some guidance during the session at times to refocus on changes he would like to make in his life.  He continues to ultimately focus on wanting increased independence.  Encouraged him to continue to explore ways of identifying both short and long-term goals.  Interventions: CBT, supportive therapy  Diagnoses:    ICD-10-CM   1. Bipolar I  disorder, current or most recent episode hypomanic ([place])  F31.0     Plan: 1.  Patient to continue to engage in individual counseling 2-4 times a month or as needed. 2.  Patient to identify and apply CBT, coping skills learned in session to decrease depression and anxiety symptoms. 3.  Patient to contact this office, go to the local ED or call 911 if a crisis or emergency develops between visits.   Anson [place], [facility]

## 2019-01-11 DIAGNOSIS — M21621 Bunionette of right foot: Secondary | ICD-10-CM | POA: Diagnosis not present

## 2019-01-11 DIAGNOSIS — M21622 Bunionette of left foot: Secondary | ICD-10-CM | POA: Diagnosis not present

## 2019-01-13 ENCOUNTER — Ambulatory Visit (INDEPENDENT_AMBULATORY_CARE_PROVIDER_SITE_OTHER): Payer: BC Managed Care – PPO | Admitting: Mental Health

## 2019-01-13 ENCOUNTER — Other Ambulatory Visit: Payer: Self-pay

## 2019-01-13 DIAGNOSIS — F310 Bipolar disorder, current episode hypomanic: Secondary | ICD-10-CM

## 2019-01-13 NOTE — Progress Notes (Signed)
Psychotherapy Note  Name: Francisco Morgan Date: 02/07/2019 MRN: [id number] DOB: 1975-11-06 PCP: Francisco Anger, MD  Time spent: 50 minutes  Treatment: Individual therapy  Mental Status Exam: Appearance:   casual  Behavior:  appropriate  Motor:  WNL  Speech/Language:   Clear, coherent  Affect:  Full range  Mood:  anxious  Thought process:  normal  Thought content:   WNL  Sensory/Perceptual disturbances:   WNL  Orientation:  x4  Attention:  Good  Concentration:  Good  Memory:  WNL  Fund of knowledge:   Good  Insight:   Fair  Judgment:   Fair  Impulse Control:  Fair   Reported Symptoms:  Decreased sleep intermittently, some anxiety and irritability  Risk Assessment: Danger to Self:  No Self-injurious Behavior: No Danger to Others: No Duty to Warn:no Physical Aggression / Violence:No  Access to Firearms a concern: No  Gang Involvement:No  Patient / guardian was educated about steps to take if suicide or homicide risk level increases between visits: yes While future psychiatric events cannot be accurately predicted, the patient does not currently require acute inpatient psychiatric care and does not currently meet [facility] involuntary commitment criteria.  Medications: Current Outpatient Medications  Medication Sig Dispense Refill  . b complex vitamins tablet Take 1 tablet by mouth daily. 100 tablet 3  . celecoxib (CELEBREX) 200 MG capsule TAKE (1) CAPSULE TWICE DAILY. 60 capsule 3  . Cholecalciferol (VITAMIN D) 2000 units CAPS 1 daily 100 capsule 3  . Cholecalciferol (VITAMIN D3) 50000 units CAPS Take 1 capsule by mouth once a week. 8 capsule 0  . clonazePAM (KLONOPIN) 0.5 MG tablet TAKE 1 TABLET THREE TIMES DAILY AS NEEDED FOR ANXIETY. 60 tablet 0  . CONCERTA 36 MG CR tablet TAKE 1 TABLET IN THE MORNING. 30 tablet 0  . divalproex (DEPAKOTE ER) 250 MG 24 hr tablet Take 1 tablet (250 mg total) by mouth at bedtime. 90 tablet 0  .  divalproex (DEPAKOTE) 500 MG DR tablet Take 2 tablets (1,000 mg total) by mouth at bedtime. 180 tablet 0  . esomeprazole (NEXIUM) 40 MG capsule Take 1 capsule (40 mg total) by mouth daily before breakfast. 90 capsule 4  . HUMIRA PEN 40 MG/0.4ML PNKT Inject 40 mg into the muscle every 14 (fourteen) days.   1  . hydrocortisone 2.5 % ointment     . lamoTRIgine (LAMICTAL) 200 MG tablet TAKE 1 TABLET ONCE DAILY. 90 tablet 0  . methylphenidate (CONCERTA) 36 MG PO CR tablet Take 1 tablet (36 mg total) by mouth daily. 30 tablet 0  . methylphenidate (RITALIN) 10 MG tablet Take 1 tablet (10 mg total) by mouth 3 (three) times daily with meals. 90 tablet 0  . methylphenidate (RITALIN) 10 MG tablet TAKE 1 TABLET 3 TIMES DAILY WITH MEALS. 90 tablet 0  . methylphenidate (RITALIN) 10 MG tablet TAKE 1 TABLET 3 TIMES DAILY WITH MEALS. 90 tablet 0  . methylphenidate (RITALIN) 10 MG tablet TAKE 1 TABLET 3 TIMES DAILY WITH MEALS. 90 tablet 0   No current facility-administered medications for this visit.     Allergies  Allergen Reactions  . Nsaids Other (See Comments)    Constipation   Subjective: Patient engaged in session in no distress.  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  Patient went into detail regarding the efforts he makes to do a thorough and complete job on the repairs and remodeling he is completing on the home renovation.  Shared how he may work for 2 to 3 days without rest then eventually rest for approximately 2 days.  Patient stated that he is often questioned by his mother regarding expenses for the remodeling in his work schedule.  Patient is meticulous and prides himself on doing the work with his history of knowledge and this is cited as a stressor in his relationship with his mother, as she often will question him about expenses and timeframes.  He shared some history related to the relationship, some day-to-day stressors in the relationship outside of  this project.  Ways to communicate effectively were explored with patient.  Patient was encouraged to be mindful of expectations as he and his mother sound considerably different in many areas.  Continue to work with patient from a cognitive behavioral framework with problem solving strategies.  Interventions: CBT, supportive therapy  Diagnoses:    ICD-10-CM   1. Bipolar I disorder, current or most recent episode hypomanic ([place])  F31.0     Plan: 1.  Patient to continue to engage in individual counseling 2-4 times a month or as needed. 2.  Patient to identify and apply CBT, coping skills learned in session to decrease depression and anxiety symptoms. 3.  Patient to contact this office, go to the local ED or call 911 if a crisis or emergency develops between visits.   Anson [place], [facility]

## 2019-01-16 ENCOUNTER — Other Ambulatory Visit: Payer: Self-pay | Admitting: Psychiatry

## 2019-01-16 DIAGNOSIS — F310 Bipolar disorder, current episode hypomanic: Secondary | ICD-10-CM

## 2019-01-25 ENCOUNTER — Other Ambulatory Visit: Payer: Self-pay | Admitting: Psychiatry

## 2019-01-25 DIAGNOSIS — F902 Attention-deficit hyperactivity disorder, combined type: Secondary | ICD-10-CM

## 2019-01-25 NOTE — Telephone Encounter (Signed)
Next appt 07/22

## 2019-01-27 ENCOUNTER — Ambulatory Visit: Payer: BC Managed Care – PPO | Admitting: Mental Health

## 2019-01-27 ENCOUNTER — Other Ambulatory Visit: Payer: Self-pay

## 2019-01-27 DIAGNOSIS — F310 Bipolar disorder, current episode hypomanic: Secondary | ICD-10-CM | POA: Diagnosis not present

## 2019-01-27 NOTE — Progress Notes (Signed)
Psychotherapy Note  Name: Francisco Morgan Date: 01/27/19 MRN: [id number] DOB: 1976-03-25 PCP: [name], MD  Time spent: 50 minutes  Treatment: Individual therapy  Mental Status Exam: Appearance:   casual  Behavior:  appropriate  Motor:  WNL  Speech/Language:   Clear, coherent  Affect:  Full range  Mood:  anxious  Thought process:  normal  Thought content:   WNL  Sensory/Perceptual disturbances:   WNL  Orientation:  x4  Attention:  Good  Concentration:  Good  Memory:  WNL  Fund of knowledge:   Good  Insight:   Fair  Judgment:   Fair  Impulse Control:  Fair   Reported Symptoms:  Decreased sleep intermittently, some anxiety and irritability  Risk Assessment: Danger to Self:  No Self-injurious Behavior: No Danger to Others: No Duty to Warn:no Physical Aggression / Violence:No  Access to Firearms a concern: No  Gang Involvement:No  Patient / guardian was educated about steps to take if suicide or homicide risk level increases between visits: yes While future psychiatric events cannot be accurately predicted, the patient does not currently require acute inpatient psychiatric care and does not currently meet [facility] involuntary commitment criteria.  Medications: Current Outpatient Medications  Medication Sig Dispense Refill  . b complex vitamins tablet Take 1 tablet by mouth daily. 100 tablet 3  . celecoxib (CELEBREX) 200 MG capsule TAKE (1) CAPSULE TWICE DAILY. 60 capsule 0  . Cholecalciferol (VITAMIN D) 2000 units CAPS 1 daily 100 capsule 3  . clonazePAM (KLONOPIN) 0.5 MG tablet TAKE 1 TABLET THREE TIMES DAILY AS NEEDED FOR ANXIETY. 60 tablet 0  . divalproex (DEPAKOTE ER) 250 MG 24 hr tablet Take 1 tablet (250 mg total) by mouth at bedtime. 90 tablet 0  . divalproex (DEPAKOTE) 500 MG DR tablet Take 2 tablets (1,000 mg total) by mouth at bedtime. 180 tablet 0  . esomeprazole (NEXIUM) 40 MG capsule Take 1 capsule (40 mg total) by  mouth daily before breakfast. 90 capsule 4  . HUMIRA PEN 40 MG/0.4ML PNKT Inject 40 mg into the muscle every 14 (fourteen) days.   1  . hydrocortisone 2.5 % ointment     . lamoTRIgine (LAMICTAL) 200 MG tablet TAKE 1 TABLET ONCE DAILY. 90 tablet 0  . methylphenidate (CONCERTA) 36 MG PO CR tablet Take 1 tablet (36 mg total) by mouth every morning. 30 tablet 0  . methylphenidate (CONCERTA) 36 MG PO CR tablet Take 1 tablet (36 mg total) by mouth daily. 30 tablet 0  . [START ON 04/13/2019] methylphenidate (CONCERTA) 36 MG PO CR tablet Take 1 tablet (36 mg total) by mouth daily. 30 tablet 0  . methylphenidate (RITALIN) 10 MG tablet TAKE 1 TABLET 3 TIMES DAILY WITH MEALS. 90 tablet 0  . methylphenidate (RITALIN) 10 MG tablet Take 1 tablet (10 mg total) by mouth 3 (three) times daily with meals. 90 tablet 0  . [START ON 04/13/2019] methylphenidate (RITALIN) 10 MG tablet Take 1 tablet (10 mg total) by mouth 3 (three) times daily with meals. 90 tablet 0  . methylphenidate (RITALIN) 10 MG tablet Take 1 tablet (10 mg total) by mouth 3 (three) times daily with meals. 90 tablet 0   No current facility-administered medications for this visit.     Allergies  Allergen Reactions  . Nsaids Other (See Comments)    Constipation   Subjective: Patient engaged in session in no distress.  Patient shared progress.  Some continued stress related to working on his parent's property.  Shared some specifics regarding relational conflict primarily between patient and his mother day-to-day.  Provide support, understanding.  Continue to assist patient in ways to communicate effectively in the relationship per information given. He plans to follow through. 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  Encouraged him to continue to explore ways of identifying both short and long-term goals.  Interventions: CBT, supportive therapy  Diagnoses:    ICD-10-CM   1. Bipolar I disorder, current or most  recent episode hypomanic ([place])  F31.0     Plan: 1.  Patient to continue to engage in individual counseling 2-4 times a month or as needed. 2.  Patient to identify and apply CBT, coping skills learned in session to decrease depression and anxiety symptoms. 3.  Patient to contact this office, go to the local ED or call 911 if a crisis or emergency develops between visits.   Anson [place], [facility]

## 2019-02-10 ENCOUNTER — Ambulatory Visit: Payer: BC Managed Care – PPO | Admitting: Mental Health

## 2019-02-16 ENCOUNTER — Encounter: Payer: Self-pay | Admitting: Psychiatry

## 2019-02-16 ENCOUNTER — Ambulatory Visit (INDEPENDENT_AMBULATORY_CARE_PROVIDER_SITE_OTHER): Payer: BC Managed Care – PPO | Admitting: Psychiatry

## 2019-02-16 ENCOUNTER — Other Ambulatory Visit: Payer: Self-pay

## 2019-02-16 VITALS — BP 108/93 | HR 77

## 2019-02-16 DIAGNOSIS — F902 Attention-deficit hyperactivity disorder, combined type: Secondary | ICD-10-CM

## 2019-02-16 DIAGNOSIS — F310 Bipolar disorder, current episode hypomanic: Secondary | ICD-10-CM

## 2019-02-16 DIAGNOSIS — F4310 Post-traumatic stress disorder, unspecified: Secondary | ICD-10-CM

## 2019-02-16 MED ORDER — METHYLPHENIDATE HCL ER (OSM) 36 MG PO TBCR: 36.0000 mg | EXTENDED_RELEASE_TABLET | Freq: Every day | ORAL | 0 refills | Status: DC

## 2019-02-16 MED ORDER — METHYLPHENIDATE HCL 10 MG PO TABS: 10.0000 mg | ORAL_TABLET | Freq: Three times a day (TID) | ORAL | 0 refills | Status: DC

## 2019-02-16 MED ORDER — METHYLPHENIDATE HCL ER (OSM) 36 MG PO TBCR: 36.0000 mg | EXTENDED_RELEASE_TABLET | Freq: Every morning | ORAL | 0 refills | Status: DC
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
## 2019-02-21 ENCOUNTER — Other Ambulatory Visit: Payer: Self-pay | Admitting: Psychiatry

## 2019-02-22 ENCOUNTER — Ambulatory Visit (INDEPENDENT_AMBULATORY_CARE_PROVIDER_SITE_OTHER): Payer: BC Managed Care – PPO | Admitting: Orthopedic Surgery

## 2019-02-22 ENCOUNTER — Encounter: Payer: Self-pay | Admitting: Orthopedic Surgery

## 2019-02-22 ENCOUNTER — Ambulatory Visit (INDEPENDENT_AMBULATORY_CARE_PROVIDER_SITE_OTHER): Payer: BC Managed Care – PPO

## 2019-02-22 DIAGNOSIS — M25521 Pain in right elbow: Secondary | ICD-10-CM | POA: Diagnosis not present

## 2019-02-22 DIAGNOSIS — M7711 Lateral epicondylitis, right elbow: Secondary | ICD-10-CM

## 2019-02-22 MED ORDER — METHYLPREDNISOLONE ACETATE 40 MG/ML IJ SUSP
40.0000 mg | INTRAMUSCULAR | Status: AC | PRN
  Administered 2019-02-22: 40 mg via INTRA_ARTICULAR

## 2019-02-22 MED ORDER — LIDOCAINE HCL 1 % IJ SOLN
2.0000 mL | INTRAMUSCULAR | Status: AC | PRN
  Administered 2019-02-22: 2 mL
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
## 2019-02-24 ENCOUNTER — Other Ambulatory Visit: Payer: Self-pay

## 2019-02-24 ENCOUNTER — Ambulatory Visit (INDEPENDENT_AMBULATORY_CARE_PROVIDER_SITE_OTHER): Payer: BC Managed Care – PPO | Admitting: Mental Health

## 2019-02-24 DIAGNOSIS — F310 Bipolar disorder, current episode hypomanic: Secondary | ICD-10-CM

## 2019-02-24 NOTE — Progress Notes (Signed)
Psychotherapy Note  Name: Francisco Morgan Date: 02/24/2019 MRN: [id number] DOB: 05-21-1976 PCP: [name], MD  Time spent: 55 minutes  Treatment: Individual therapy  Mental Status Exam: Appearance:   casual  Behavior:  appropriate  Motor:  WNL  Speech/Language:   Clear, coherent  Affect:  Full range  Mood:  anxious  Thought process:  normal  Thought content:   WNL  Sensory/Perceptual disturbances:   WNL  Orientation:  x4  Attention:  Good  Concentration:  Good  Memory:  WNL  Fund of knowledge:   Good  Insight:   Fair  Judgment:   Fair  Impulse Control:  Fair   Reported Symptoms:  Decreased sleep intermittently, some anxiety and irritability  Risk Assessment: Danger to Self:  No Self-injurious Behavior: No Danger to Others: No Duty to Warn:no Physical Aggression / Violence:No  Access to Firearms a concern: No  Gang Involvement:No  Patient / guardian was educated about steps to take if suicide or homicide risk level increases between visits: yes While future psychiatric events cannot be accurately predicted, the patient does not currently require acute inpatient psychiatric care and does not currently meet [facility] involuntary commitment criteria.  Medications: Current Outpatient Medications  Medication Sig Dispense Refill  . b complex vitamins tablet Take 1 tablet by mouth daily. 100 tablet 3  . celecoxib (CELEBREX) 200 MG capsule TAKE (1) CAPSULE TWICE DAILY. 60 capsule 3  . Cholecalciferol (VITAMIN D) 2000 units CAPS 1 daily 100 capsule 3  . clonazePAM (KLONOPIN) 0.5 MG tablet TAKE 1 TABLET THREE TIMES DAILY AS NEEDED FOR ANXIETY. 60 tablet 0  . divalproex (DEPAKOTE ER) 250 MG 24 hr tablet Take 1 tablet (250 mg total) by mouth at bedtime. 90 tablet 0  . divalproex (DEPAKOTE) 500 MG DR tablet Take 2 tablets (1,000 mg total) by mouth at bedtime. 180 tablet 0  . esomeprazole (NEXIUM) 40 MG capsule Take 1 capsule (40 mg total) by  mouth daily before breakfast. 90 capsule 4  . HUMIRA PEN 40 MG/0.4ML PNKT Inject 40 mg into the muscle every 14 (fourteen) days.   1  . hydrocortisone 2.5 % ointment     . lamoTRIgine (LAMICTAL) 200 MG tablet TAKE 1 TABLET ONCE DAILY. 90 tablet 0  . methylphenidate (CONCERTA) 36 MG PO CR tablet Take 1 tablet (36 mg total) by mouth every morning. 30 tablet 0  . [START ON 03/16/2019] methylphenidate (CONCERTA) 36 MG PO CR tablet Take 1 tablet (36 mg total) by mouth daily. 30 tablet 0  . [START ON 04/13/2019] methylphenidate (CONCERTA) 36 MG PO CR tablet Take 1 tablet (36 mg total) by mouth daily. 30 tablet 0  . methylphenidate (RITALIN) 10 MG tablet TAKE 1 TABLET 3 TIMES DAILY WITH MEALS. 90 tablet 0  . [START ON 03/16/2019] methylphenidate (RITALIN) 10 MG tablet Take 1 tablet (10 mg total) by mouth 3 (three) times daily with meals. 90 tablet 0  . [START ON 04/13/2019] methylphenidate (RITALIN) 10 MG tablet Take 1 tablet (10 mg total) by mouth 3 (three) times daily with meals. 90 tablet 0  . methylphenidate (RITALIN) 10 MG tablet Take 1 tablet (10 mg total) by mouth 3 (three) times daily with meals. 90 tablet 0   No current facility-administered medications for this visit.     Allergies  Allergen Reactions  . Nsaids Other (See Comments)    Constipation   Subjective: Patient engaged in session in no distress.  Discussed progress since our last visit.  Continues  to have relational stress w/ his parents.  Most of the session was centered around his sharing some details related to how his parents expectations of his progress on their home construction.  Patient shared how he has more relational strain with his mother giving some examples.  Expressed feelings feeling like a failure to his parents, not receiving any validation from them for his various accomplishments, stating they tend to focus on "what I am doing wrong".  We continue to discuss efforts he is making toward his goal of having increased  independence.  Encouraged him to continue to explore ways of identifying both short and long-term goals.  Interventions: CBT, supportive therapy  Diagnoses:    ICD-10-CM   1. Bipolar I disorder, current or most recent episode hypomanic ([place])  F31.0     Plan: 1.  Patient to continue to engage in individual counseling 2-4 times a month or as needed. 2.  Patient to identify and apply CBT, coping skills learned in session to decrease depression and anxiety symptoms. 3.  Patient to contact this office, go to the local ED or call 911 if a crisis or emergency develops between visits.   Anson [place], [facility]

## 2019-03-08 ENCOUNTER — Other Ambulatory Visit: Payer: Self-pay | Admitting: Internal Medicine

## 2019-03-10 ENCOUNTER — Ambulatory Visit (INDEPENDENT_AMBULATORY_CARE_PROVIDER_SITE_OTHER): Payer: BC Managed Care – PPO | Admitting: Mental Health

## 2019-03-10 ENCOUNTER — Other Ambulatory Visit: Payer: Self-pay

## 2019-03-10 DIAGNOSIS — F310 Bipolar disorder, current episode hypomanic: Secondary | ICD-10-CM

## 2019-03-10 NOTE — Progress Notes (Signed)
Psychotherapy Note  Name: Francisco Morgan Date: 03/10/2019 MRN: [id number] DOB: 07/18/1976 PCP: [name], MD  Time spent: 54 minutes  Treatment: Individual therapy  Mental Status Exam: Appearance:   casual  Behavior:  appropriate  Motor:  WNL  Speech/Language:   Clear, coherent  Affect:  Full range  Mood:  anxious  Thought process:  normal  Thought content:   WNL  Sensory/Perceptual disturbances:   WNL  Orientation:  x4  Attention:  Good  Concentration:  Good  Memory:  WNL  Fund of knowledge:   Good  Insight:   Fair  Judgment:   Fair  Impulse Control:  Fair   Reported Symptoms:  Decreased sleep intermittently, some anxiety and irritability  Risk Assessment: Danger to Self:  No Self-injurious Behavior: No Danger to Others: No Duty to Warn:no Physical Aggression / Violence:No  Access to Firearms a concern: No  Gang Involvement:No  Patient / guardian was educated about steps to take if suicide or homicide risk level increases between visits: yes While future psychiatric events cannot be accurately predicted, the patient does not currently require acute inpatient psychiatric care and does not currently meet [facility] involuntary commitment criteria.  Medications: Current Outpatient Medications  Medication Sig Dispense Refill  . b complex vitamins tablet Take 1 tablet by mouth daily. 100 tablet 3  . celecoxib (CELEBREX) 200 MG capsule TAKE (1) CAPSULE TWICE DAILY. 60 capsule 0  . Cholecalciferol (VITAMIN D) 2000 units CAPS 1 daily 100 capsule 3  . clonazePAM (KLONOPIN) 0.5 MG tablet TAKE 1 TABLET THREE TIMES DAILY AS NEEDED FOR ANXIETY. 60 tablet 0  . divalproex (DEPAKOTE ER) 250 MG 24 hr tablet Take 1 tablet (250 mg total) by mouth at bedtime. 90 tablet 0  . divalproex (DEPAKOTE) 500 MG DR tablet Take 2 tablets (1,000 mg total) by mouth at bedtime. 180 tablet 0  . esomeprazole (NEXIUM) 40 MG capsule Take 1 capsule (40 mg total) by  mouth daily before breakfast. 90 capsule 4  . HUMIRA PEN 40 MG/0.4ML PNKT Inject 40 mg into the muscle every 14 (fourteen) days.   1  . hydrocortisone 2.5 % ointment     . lamoTRIgine (LAMICTAL) 200 MG tablet TAKE 1 TABLET ONCE DAILY. 90 tablet 0  . methylphenidate (CONCERTA) 36 MG PO CR tablet Take 1 tablet (36 mg total) by mouth every morning. 30 tablet 0  . [START ON 03/16/2019] methylphenidate (CONCERTA) 36 MG PO CR tablet Take 1 tablet (36 mg total) by mouth daily. 30 tablet 0  . [START ON 04/13/2019] methylphenidate (CONCERTA) 36 MG PO CR tablet Take 1 tablet (36 mg total) by mouth daily. 30 tablet 0  . methylphenidate (RITALIN) 10 MG tablet TAKE 1 TABLET 3 TIMES DAILY WITH MEALS. 90 tablet 0  . [START ON 03/16/2019] methylphenidate (RITALIN) 10 MG tablet Take 1 tablet (10 mg total) by mouth 3 (three) times daily with meals. 90 tablet 0  . [START ON 04/13/2019] methylphenidate (RITALIN) 10 MG tablet Take 1 tablet (10 mg total) by mouth 3 (three) times daily with meals. 90 tablet 0  . methylphenidate (RITALIN) 10 MG tablet Take 1 tablet (10 mg total) by mouth 3 (three) times daily with meals. 90 tablet 0   No current facility-administered medications for this visit.     Allergies  Allergen Reactions  . Nsaids Other (See Comments)    Constipation   Subjective: Patient engaged in session in no distress.  Discussed progress.  He reported some improvement with  the relationship with his parents.  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  Patient stated he was satisfied with the results of the discussion.  He shared how he feels changing some of the ways he may communicate with his mother specifically if he raises his voice may be helpful to the relationship.  Patient went on to share his routine in accomplishing his day-to-day work tasks as well as conveying to his parents his progress on a more daily basis as  he shared this being a need in the relationship.  Having a more steady income will increase some levels of independence for patient.  Continue to work with patient from my cognitive behavioral framework, providing support engaging in problem solving exploring communication strategies.   Interventions: CBT, supportive therapy, problem solving  Diagnoses:    ICD-10-CM   1. Bipolar I disorder, current or most recent episode hypomanic ([place])  F31.0     Plan: 1.  Patient to continue to engage in individual counseling 2-4 times a month or as needed. 2.  Patient to identify and apply CBT, coping skills learned in session to decrease depression and anxiety symptoms. 3.  Patient to contact this office, go to the local ED or call 911 if a crisis or emergency develops between visits.   Anson [place], [facility]

## 2019-03-21 DIAGNOSIS — L400 Psoriasis vulgaris: Secondary | ICD-10-CM | POA: Diagnosis not present

## 2019-03-21 DIAGNOSIS — M1A09X0 Idiopathic chronic gout, multiple sites, without tophus (tophi): Secondary | ICD-10-CM | POA: Diagnosis not present

## 2019-03-21 DIAGNOSIS — L4059 Other psoriatic arthropathy: Secondary | ICD-10-CM | POA: Diagnosis not present

## 2019-03-24 ENCOUNTER — Ambulatory Visit: Payer: BC Managed Care – PPO | Admitting: Mental Health

## 2019-04-07 ENCOUNTER — Ambulatory Visit (INDEPENDENT_AMBULATORY_CARE_PROVIDER_SITE_OTHER): Payer: BC Managed Care – PPO | Admitting: Mental Health

## 2019-04-07 ENCOUNTER — Other Ambulatory Visit: Payer: Self-pay

## 2019-04-07 DIAGNOSIS — F310 Bipolar disorder, current episode hypomanic: Secondary | ICD-10-CM | POA: Diagnosis not present

## 2019-04-07 NOTE — Progress Notes (Signed)
Psychotherapy Note  Name: Francisco Morgan Date: 04/13/2019 MRN: [id number] DOB: 1975-12-13 PCP: [name], MD  Time spent: 54 minutes  Treatment: Individual therapy  Mental Status Exam: Appearance:   casual  Behavior:  appropriate  Motor:  WNL  Speech/Language:   Clear, coherent  Affect:  Full range  Mood:  anxious  Thought process:  normal  Thought content:   WNL  Sensory/Perceptual disturbances:   WNL  Orientation:  x4  Attention:  Good  Concentration:  Good  Memory:  WNL  Fund of knowledge:   Good  Insight:   Fair  Judgment:   Fair  Impulse Control:  Fair   Reported Symptoms:  Decreased sleep intermittently, some anxiety and irritability  Risk Assessment: Danger to Self:  No Self-injurious Behavior: No Danger to Others: No Duty to Warn:no Physical Aggression / Violence:No  Access to Firearms a concern: No  Gang Involvement:No  Patient / guardian was educated about steps to take if suicide or homicide risk level increases between visits: yes While future psychiatric events cannot be accurately predicted, the patient does not currently require acute inpatient psychiatric care and does not currently meet [facility] involuntary commitment criteria.  Medications: Current Outpatient Medications  Medication Sig Dispense Refill  . b complex vitamins tablet Take 1 tablet by mouth daily. 100 tablet 3  . celecoxib (CELEBREX) 200 MG capsule TAKE (1) CAPSULE TWICE DAILY. 60 capsule 0  . Cholecalciferol (VITAMIN D) 2000 units CAPS 1 daily 100 capsule 3  . clonazePAM (KLONOPIN) 0.5 MG tablet TAKE 1 TABLET THREE TIMES DAILY AS NEEDED FOR ANXIETY. 60 tablet 0  . divalproex (DEPAKOTE ER) 250 MG 24 hr tablet Take 1 tablet (250 mg total) by mouth at bedtime. 90 tablet 0  . divalproex (DEPAKOTE) 500 MG DR tablet Take 2 tablets (1,000 mg total) by mouth at bedtime. 180 tablet 0  . esomeprazole (NEXIUM) 40 MG capsule Take 1 capsule (40 mg total) by  mouth daily before breakfast. 90 capsule 4  . HUMIRA PEN 40 MG/0.4ML PNKT Inject 40 mg into the muscle every 14 (fourteen) days.   1  . hydrocortisone 2.5 % ointment     . lamoTRIgine (LAMICTAL) 200 MG tablet TAKE 1 TABLET ONCE DAILY. 90 tablet 0  . methylphenidate (CONCERTA) 36 MG PO CR tablet Take 1 tablet (36 mg total) by mouth every morning. 30 tablet 0  . methylphenidate (CONCERTA) 36 MG PO CR tablet Take 1 tablet (36 mg total) by mouth daily. 30 tablet 0  . methylphenidate (CONCERTA) 36 MG PO CR tablet Take 1 tablet (36 mg total) by mouth daily. 30 tablet 0  . methylphenidate (RITALIN) 10 MG tablet TAKE 1 TABLET 3 TIMES DAILY WITH MEALS. 90 tablet 0  . methylphenidate (RITALIN) 10 MG tablet Take 1 tablet (10 mg total) by mouth 3 (three) times daily with meals. 90 tablet 0  . methylphenidate (RITALIN) 10 MG tablet Take 1 tablet (10 mg total) by mouth 3 (three) times daily with meals. 90 tablet 0  . methylphenidate (RITALIN) 10 MG tablet Take 1 tablet (10 mg total) by mouth 3 (three) times daily with meals. 90 tablet 0   No current facility-administered medications for this visit.     Allergies  Allergen Reactions  . Nsaids Other (See Comments)    Constipation   Subjective: Patient engaged in session in mild distress.  Discussed progress.  He shared a recent discussion he had with his parents.  He stated that he and his  mother have the most relationship strain as she is directly involved with the accounting of the property with construction in which she is involved.  Patient shared ways he is attempted to set boundaries with his mother.  Express associated feelings including often feeling judged or not given validation for his knowledge.  He shared how this is been a pervasive theme in their relationship prior to this current project due to past experiences in the relationship.  Ways to communicate in this relationship were explored and identified.  Patient plans to follow through with  effective communication specifically using empowering statements versus directive ones. Continue to work with patient from my cognitive behavioral framework, providing support engaging in problem solving exploring communication strategies.   Interventions: CBT, supportive therapy, problem solving  Diagnoses:    ICD-10-CM   1. Bipolar I disorder, current or most recent episode hypomanic ([place])  F31.0     Plan: 1.  Patient to continue to engage in individual counseling 2-4 times a month or as needed. 2.  Patient to identify and apply CBT, coping skills learned in session to decrease depression and anxiety symptoms. 3.  Patient to contact this office, go to the local ED or call 911 if a crisis or emergency develops between visits.   Anson [place], [facility]

## 2019-04-21 ENCOUNTER — Ambulatory Visit: Payer: BC Managed Care – PPO | Admitting: Mental Health

## 2019-04-21 DIAGNOSIS — M9901 Segmental and somatic dysfunction of cervical region: Secondary | ICD-10-CM | POA: Diagnosis not present

## 2019-04-21 DIAGNOSIS — M9907 Segmental and somatic dysfunction of upper extremity: Secondary | ICD-10-CM | POA: Diagnosis not present

## 2019-04-21 DIAGNOSIS — M9902 Segmental and somatic dysfunction of thoracic region: Secondary | ICD-10-CM | POA: Diagnosis not present

## 2019-04-21 DIAGNOSIS — M9903 Segmental and somatic dysfunction of lumbar region: Secondary | ICD-10-CM | POA: Diagnosis not present

## 2019-04-25 DIAGNOSIS — M9902 Segmental and somatic dysfunction of thoracic region: Secondary | ICD-10-CM | POA: Diagnosis not present

## 2019-04-25 DIAGNOSIS — M9901 Segmental and somatic dysfunction of cervical region: Secondary | ICD-10-CM | POA: Diagnosis not present

## 2019-04-25 DIAGNOSIS — M9903 Segmental and somatic dysfunction of lumbar region: Secondary | ICD-10-CM | POA: Diagnosis not present

## 2019-04-25 DIAGNOSIS — M9907 Segmental and somatic dysfunction of upper extremity: Secondary | ICD-10-CM | POA: Diagnosis not present

## 2019-04-27 ENCOUNTER — Encounter: Payer: Self-pay | Admitting: Internal Medicine

## 2019-04-27 ENCOUNTER — Other Ambulatory Visit: Payer: Self-pay

## 2019-04-27 ENCOUNTER — Ambulatory Visit (INDEPENDENT_AMBULATORY_CARE_PROVIDER_SITE_OTHER): Payer: BC Managed Care – PPO | Admitting: Internal Medicine

## 2019-04-27 ENCOUNTER — Other Ambulatory Visit (INDEPENDENT_AMBULATORY_CARE_PROVIDER_SITE_OTHER): Payer: BC Managed Care – PPO

## 2019-04-27 VITALS — BP 126/82 | HR 61 | Temp 98.1°F | Ht 68.0 in | Wt 173.0 lb

## 2019-04-27 DIAGNOSIS — Z23 Encounter for immunization: Secondary | ICD-10-CM | POA: Diagnosis not present

## 2019-04-27 DIAGNOSIS — F3177 Bipolar disorder, in partial remission, most recent episode mixed: Secondary | ICD-10-CM

## 2019-04-27 DIAGNOSIS — L4050 Arthropathic psoriasis, unspecified: Secondary | ICD-10-CM

## 2019-04-27 DIAGNOSIS — Z0000 Encounter for general adult medical examination without abnormal findings: Secondary | ICD-10-CM

## 2019-04-27 DIAGNOSIS — E559 Vitamin D deficiency, unspecified: Secondary | ICD-10-CM

## 2019-04-27 LAB — CBC WITH DIFFERENTIAL/PLATELET
Basophils Absolute: 0.1 10*3/uL (ref 0.0–0.1)
Basophils Relative: 0.9 % (ref 0.0–3.0)
Eosinophils Absolute: 0.2 10*3/uL (ref 0.0–0.7)
Eosinophils Relative: 3.6 % (ref 0.0–5.0)
HCT: 43 % (ref 39.0–52.0)
Hemoglobin: 14.6 g/dL (ref 13.0–17.0)
Lymphocytes Relative: 45.9 % (ref 12.0–46.0)
Lymphs Abs: 3.2 10*3/uL (ref 0.7–4.0)
MCHC: 34 g/dL (ref 30.0–36.0)
MCV: 96.4 fl (ref 78.0–100.0)
Monocytes Absolute: 0.6 10*3/uL (ref 0.1–1.0)
Monocytes Relative: 8.8 % (ref 3.0–12.0)
Neutro Abs: 2.8 10*3/uL (ref 1.4–7.7)
Neutrophils Relative %: 40.8 % — ABNORMAL LOW (ref 43.0–77.0)
Platelets: 235 10*3/uL (ref 150.0–400.0)
RBC: 4.46 Mil/uL (ref 4.22–5.81)
RDW: 12.7 % (ref 11.5–15.5)
WBC: 6.9 10*3/uL (ref 4.0–10.5)

## 2019-04-27 LAB — HEPATIC FUNCTION PANEL
ALT: 17 U/L (ref 0–53)
AST: 15 U/L (ref 0–37)
Albumin: 4.3 g/dL (ref 3.5–5.2)
Alkaline Phosphatase: 62 U/L (ref 39–117)
Bilirubin, Direct: 0.1 mg/dL (ref 0.0–0.3)
Total Bilirubin: 0.3 mg/dL (ref 0.2–1.2)
Total Protein: 6.8 g/dL (ref 6.0–8.3)

## 2019-04-27 LAB — LIPID PANEL
Cholesterol: 183 mg/dL (ref 0–200)
HDL: 73.9 mg/dL (ref >39.00)
NonHDL: 109.45
Total CHOL/HDL Ratio: 2
Triglycerides: 205 mg/dL — ABNORMAL HIGH (ref 0.0–149.0)
VLDL: 41 mg/dL — ABNORMAL HIGH (ref 0.0–40.0)

## 2019-04-27 LAB — VITAMIN B12: Vitamin B-12: 1456 pg/mL — ABNORMAL HIGH (ref 211–911)

## 2019-04-27 LAB — BASIC METABOLIC PANEL
BUN: 8 mg/dL (ref 6–23)
CO2: 32 mEq/L (ref 19–32)
Calcium: 9.8 mg/dL (ref 8.4–10.5)
Chloride: 102 mEq/L (ref 96–112)
Creatinine, Ser: 0.69 mg/dL (ref 0.40–1.50)
GFR: 125.53 mL/min (ref >60.00)
Glucose, Bld: 90 mg/dL (ref 70–99)
Potassium: 4.5 mEq/L (ref 3.5–5.1)
Sodium: 141 mEq/L (ref 135–145)

## 2019-04-27 LAB — URINALYSIS
Bilirubin Urine: NEGATIVE
Hgb urine dipstick: NEGATIVE
Ketones, ur: NEGATIVE
Leukocytes,Ua: NEGATIVE
Nitrite: NEGATIVE
Specific Gravity, Urine: 1.02 (ref 1.000–1.030)
Total Protein, Urine: NEGATIVE
Urine Glucose: NEGATIVE
Urobilinogen, UA: 0.2 (ref 0.0–1.0)
pH: 7.5 (ref 5.0–8.0)

## 2019-04-27 LAB — TSH: TSH: 2.4 u[IU]/mL (ref 0.35–4.50)

## 2019-04-27 LAB — VITAMIN D 25 HYDROXY (VIT D DEFICIENCY, FRACTURES): VITD: 25.86 ng/mL — ABNORMAL LOW (ref 30.00–100.00)

## 2019-04-27 LAB — PSA: PSA: 0.29 ng/mL (ref 0.10–4.00)

## 2019-04-27 LAB — LDL CHOLESTEROL, DIRECT: Direct LDL: 79 mg/dL

## 2019-04-27 MED ORDER — CELECOXIB 200 MG PO CAPS: ORAL_CAPSULE | ORAL | 2 refills | Status: DC

## 2019-04-27 NOTE — Assessment & Plan Note (Signed)
Depakote levels

## 2019-04-27 NOTE — Assessment & Plan Note (Addendum)
We discussed age appropriate health related issues, including available/recomended screening tests and vaccinations. We discussed a need for adhering to healthy diet and exercise. Labs were ordered to be later reviewed . All questions were answered. Quentin declined pneumonia vaccine
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
## 2019-04-27 NOTE — Assessment & Plan Note (Signed)
Celebrex Humira

## 2019-04-27 NOTE — Assessment & Plan Note (Signed)
Vit D 

## 2019-04-28 DIAGNOSIS — M9902 Segmental and somatic dysfunction of thoracic region: Secondary | ICD-10-CM | POA: Diagnosis not present

## 2019-04-28 DIAGNOSIS — M9901 Segmental and somatic dysfunction of cervical region: Secondary | ICD-10-CM | POA: Diagnosis not present

## 2019-04-28 DIAGNOSIS — M9907 Segmental and somatic dysfunction of upper extremity: Secondary | ICD-10-CM | POA: Diagnosis not present

## 2019-04-28 DIAGNOSIS — M9903 Segmental and somatic dysfunction of lumbar region: Secondary | ICD-10-CM | POA: Diagnosis not present

## 2019-04-28 LAB — VALPROIC ACID LEVEL: Valproic Acid Lvl: 19.2 mg/L — ABNORMAL LOW (ref 50.0–100.0)

## 2019-04-29 ENCOUNTER — Other Ambulatory Visit: Payer: Self-pay | Admitting: Internal Medicine

## 2019-04-29 DIAGNOSIS — M9903 Segmental and somatic dysfunction of lumbar region: Secondary | ICD-10-CM | POA: Diagnosis not present

## 2019-04-29 DIAGNOSIS — M9907 Segmental and somatic dysfunction of upper extremity: Secondary | ICD-10-CM | POA: Diagnosis not present

## 2019-04-29 DIAGNOSIS — M9901 Segmental and somatic dysfunction of cervical region: Secondary | ICD-10-CM | POA: Diagnosis not present

## 2019-04-29 DIAGNOSIS — M9902 Segmental and somatic dysfunction of thoracic region: Secondary | ICD-10-CM | POA: Diagnosis not present

## 2019-04-29 MED ORDER — VITAMIN D3 1.25 MG (50000 UT) PO CAPS: 1.0000 | ORAL_CAPSULE | ORAL | 0 refills | Status: DC

## 2019-05-02 DIAGNOSIS — M9907 Segmental and somatic dysfunction of upper extremity: Secondary | ICD-10-CM | POA: Diagnosis not present

## 2019-05-02 DIAGNOSIS — M9903 Segmental and somatic dysfunction of lumbar region: Secondary | ICD-10-CM | POA: Diagnosis not present

## 2019-05-02 DIAGNOSIS — M9902 Segmental and somatic dysfunction of thoracic region: Secondary | ICD-10-CM | POA: Diagnosis not present

## 2019-05-02 DIAGNOSIS — M9901 Segmental and somatic dysfunction of cervical region: Secondary | ICD-10-CM | POA: Diagnosis not present

## 2019-05-03 ENCOUNTER — Other Ambulatory Visit: Payer: Self-pay | Admitting: Psychiatry

## 2019-05-03 NOTE — Telephone Encounter (Signed)
appt 05/19/2019

## 2019-05-04 DIAGNOSIS — M9902 Segmental and somatic dysfunction of thoracic region: Secondary | ICD-10-CM | POA: Diagnosis not present

## 2019-05-04 DIAGNOSIS — M9907 Segmental and somatic dysfunction of upper extremity: Secondary | ICD-10-CM | POA: Diagnosis not present

## 2019-05-04 DIAGNOSIS — M9901 Segmental and somatic dysfunction of cervical region: Secondary | ICD-10-CM | POA: Diagnosis not present

## 2019-05-04 DIAGNOSIS — M9903 Segmental and somatic dysfunction of lumbar region: Secondary | ICD-10-CM | POA: Diagnosis not present

## 2019-05-05 ENCOUNTER — Other Ambulatory Visit: Payer: Self-pay

## 2019-05-05 ENCOUNTER — Ambulatory Visit (INDEPENDENT_AMBULATORY_CARE_PROVIDER_SITE_OTHER): Payer: BC Managed Care – PPO | Admitting: Mental Health

## 2019-05-05 DIAGNOSIS — F310 Bipolar disorder, current episode hypomanic: Secondary | ICD-10-CM

## 2019-05-05 NOTE — Progress Notes (Signed)
Psychotherapy Note  Name: Francisco Morgan Date: 05/05/2019 MRN: [id number] DOB: 08-15-1975 PCP: [name], MD  Time spent: 54 minutes  Treatment: Individual therapy  Mental Status Exam: Appearance:   casual  Behavior:  appropriate  Motor:  WNL  Speech/Language:   Clear, coherent  Affect:  Full range  Mood:  anxious  Thought process:  normal  Thought content:   WNL  Sensory/Perceptual disturbances:   WNL  Orientation:  x4  Attention:  Good  Concentration:  Good  Memory:  WNL  Fund of knowledge:   Good  Insight:   Fair  Judgment:   Fair  Impulse Control:  Fair   Reported Symptoms:  Decreased sleep intermittently, some anxiety and irritability  Risk Assessment: Danger to Self:  No Self-injurious Behavior: No Danger to Others: No Duty to Warn:no Physical Aggression / Violence:No  Access to Firearms a concern: No  Gang Involvement:No  Patient / guardian was educated about steps to take if suicide or homicide risk level increases between visits: yes While future psychiatric events cannot be accurately predicted, the patient does not currently require acute inpatient psychiatric care and does not currently meet [facility] involuntary commitment criteria.  Medications: Current Outpatient Medications  Medication Sig Dispense Refill  . b complex vitamins tablet Take 1 tablet by mouth daily. 100 tablet 3  . celecoxib (CELEBREX) 200 MG capsule TAKE (1) CAPSULE TWICE DAILY. 180 capsule 2  . Cholecalciferol (VITAMIN D) 2000 units CAPS 1 daily 100 capsule 3  . Cholecalciferol (VITAMIN D3) 1.25 MG (50000 UT) CAPS Take 1 capsule by mouth once a week. 6 capsule 0  . clonazePAM (KLONOPIN) 0.5 MG tablet TAKE 1 TABLET THREE TIMES DAILY AS NEEDED FOR ANXIETY. 60 tablet 0  . divalproex (DEPAKOTE ER) 250 MG 24 hr tablet Take 1 tablet (250 mg total) by mouth at bedtime. 90 tablet 0  . divalproex (DEPAKOTE) 500 MG DR tablet Take 2 tablets (1,000 mg total)  by mouth at bedtime. 180 tablet 0  . esomeprazole (NEXIUM) 40 MG capsule Take 1 capsule (40 mg total) by mouth daily before breakfast. 90 capsule 4  . HUMIRA PEN 40 MG/0.4ML PNKT Inject 40 mg into the muscle every 14 (fourteen) days.   1  . hydrocortisone 2.5 % ointment     . lamoTRIgine (LAMICTAL) 200 MG tablet TAKE 1 TABLET ONCE DAILY. 90 tablet 0  . methylphenidate (CONCERTA) 36 MG PO CR tablet Take 1 tablet (36 mg total) by mouth every morning. 30 tablet 0  . methylphenidate (CONCERTA) 36 MG PO CR tablet Take 1 tablet (36 mg total) by mouth daily. 30 tablet 0  . methylphenidate (CONCERTA) 36 MG PO CR tablet Take 1 tablet (36 mg total) by mouth daily. 30 tablet 0  . methylphenidate (RITALIN) 10 MG tablet Take 1 tablet (10 mg total) by mouth 3 (three) times daily with meals. 90 tablet 0  . methylphenidate (RITALIN) 10 MG tablet Take 1 tablet (10 mg total) by mouth 3 (three) times daily with meals. 90 tablet 0  . methylphenidate (RITALIN) 10 MG tablet Take 1 tablet (10 mg total) by mouth 3 (three) times daily with meals. 90 tablet 0   No current facility-administered medications for this visit.     Allergies  Allergen Reactions  . Nsaids Other (See Comments)    Constipation   Subjective: Patient engaged in session in no distress.  Discussed progress. He shared how how he has been examining relationships lately, has not been in a  romantic relationship for the past 15 years. He wants to further explore what he really wants and needs in his life.  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  At that point, he will hand over the project to another developer.  Encouraged patient to journal between sessions to further explore wants/needs utilizing SMART goal setting.  Discussed thought stopping skill  to utilize between sessions to keep anxiety low.  Patient was in agreement to follow through with plans.  Interventions: CBT, supportive therapy, problem solving  Diagnoses:    ICD-10-CM   1. Bipolar I disorder, current or most recent episode hypomanic ([place])  F31.0     Individualized Plan of Care:  1. Patient to continue psychiatric medication regimen and report any concerns to his prescribing provider.  2. Patient to engage in individual psychotherapy 2-4 times per month or as needed to decrease noted symptoms and improve level of functioning.  3.  Patient to journal between sessions to further identify needs and wants toward future life goals.  4.  Patient to improve coping and decrease rumination, anxiety by utilizing thought stopping.  5. Patient to contact this office, go to the local ED or call 911 if a crisis or emergency develops between visits.  Assessment of progress:  progressing    Anson [place], [facility]

## 2019-05-09 DIAGNOSIS — M9907 Segmental and somatic dysfunction of upper extremity: Secondary | ICD-10-CM | POA: Diagnosis not present

## 2019-05-09 DIAGNOSIS — M9902 Segmental and somatic dysfunction of thoracic region: Secondary | ICD-10-CM | POA: Diagnosis not present

## 2019-05-09 DIAGNOSIS — M9903 Segmental and somatic dysfunction of lumbar region: Secondary | ICD-10-CM | POA: Diagnosis not present

## 2019-05-09 DIAGNOSIS — M9901 Segmental and somatic dysfunction of cervical region: Secondary | ICD-10-CM | POA: Diagnosis not present

## 2019-05-11 DIAGNOSIS — M9907 Segmental and somatic dysfunction of upper extremity: Secondary | ICD-10-CM | POA: Diagnosis not present

## 2019-05-11 DIAGNOSIS — M9902 Segmental and somatic dysfunction of thoracic region: Secondary | ICD-10-CM | POA: Diagnosis not present

## 2019-05-11 DIAGNOSIS — M9903 Segmental and somatic dysfunction of lumbar region: Secondary | ICD-10-CM | POA: Diagnosis not present

## 2019-05-11 DIAGNOSIS — M9901 Segmental and somatic dysfunction of cervical region: Secondary | ICD-10-CM | POA: Diagnosis not present

## 2019-05-13 DIAGNOSIS — M9901 Segmental and somatic dysfunction of cervical region: Secondary | ICD-10-CM | POA: Diagnosis not present

## 2019-05-13 DIAGNOSIS — M9903 Segmental and somatic dysfunction of lumbar region: Secondary | ICD-10-CM | POA: Diagnosis not present

## 2019-05-13 DIAGNOSIS — M9907 Segmental and somatic dysfunction of upper extremity: Secondary | ICD-10-CM | POA: Diagnosis not present

## 2019-05-13 DIAGNOSIS — M9902 Segmental and somatic dysfunction of thoracic region: Secondary | ICD-10-CM | POA: Diagnosis not present

## 2019-05-16 ENCOUNTER — Other Ambulatory Visit: Payer: Self-pay

## 2019-05-16 ENCOUNTER — Ambulatory Visit (INDEPENDENT_AMBULATORY_CARE_PROVIDER_SITE_OTHER): Payer: BC Managed Care – PPO | Admitting: Orthopedic Surgery

## 2019-05-16 ENCOUNTER — Encounter: Payer: Self-pay | Admitting: Orthopedic Surgery

## 2019-05-16 VITALS — Ht 68.0 in | Wt 173.0 lb

## 2019-05-16 DIAGNOSIS — M25871 Other specified joint disorders, right ankle and foot: Secondary | ICD-10-CM

## 2019-05-16 DIAGNOSIS — M21621 Bunionette of right foot: Secondary | ICD-10-CM | POA: Diagnosis not present

## 2019-05-16 DIAGNOSIS — M21622 Bunionette of left foot: Secondary | ICD-10-CM

## 2019-05-19 ENCOUNTER — Other Ambulatory Visit: Payer: Self-pay | Admitting: Psychiatry

## 2019-05-19 ENCOUNTER — Encounter: Payer: Self-pay | Admitting: Psychiatry

## 2019-05-19 ENCOUNTER — Ambulatory Visit (INDEPENDENT_AMBULATORY_CARE_PROVIDER_SITE_OTHER): Payer: BC Managed Care – PPO | Admitting: Mental Health

## 2019-05-19 ENCOUNTER — Other Ambulatory Visit: Payer: Self-pay

## 2019-05-19 ENCOUNTER — Ambulatory Visit (INDEPENDENT_AMBULATORY_CARE_PROVIDER_SITE_OTHER): Payer: BC Managed Care – PPO | Admitting: Psychiatry

## 2019-05-19 VITALS — BP 153/94 | HR 92

## 2019-05-19 DIAGNOSIS — F902 Attention-deficit hyperactivity disorder, combined type: Secondary | ICD-10-CM

## 2019-05-19 DIAGNOSIS — F310 Bipolar disorder, current episode hypomanic: Secondary | ICD-10-CM

## 2019-05-19 DIAGNOSIS — M9907 Segmental and somatic dysfunction of upper extremity: Secondary | ICD-10-CM | POA: Diagnosis not present

## 2019-05-19 DIAGNOSIS — F4310 Post-traumatic stress disorder, unspecified: Secondary | ICD-10-CM

## 2019-05-19 DIAGNOSIS — M9901 Segmental and somatic dysfunction of cervical region: Secondary | ICD-10-CM | POA: Diagnosis not present

## 2019-05-19 DIAGNOSIS — M9903 Segmental and somatic dysfunction of lumbar region: Secondary | ICD-10-CM | POA: Diagnosis not present

## 2019-05-19 DIAGNOSIS — M9902 Segmental and somatic dysfunction of thoracic region: Secondary | ICD-10-CM | POA: Diagnosis not present
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
## 2019-05-19 NOTE — Progress Notes (Signed)
Psychotherapy Note  Name: Francisco Morgan Date: 06/19/2019 MRN: [id number] DOB: 09-24-1975 PCP: [name], MD  Time spent: 54 minutes  Treatment: Individual therapy  Mental Status Exam: Appearance:   casual  Behavior:  appropriate  Motor:  WNL  Speech/Language:   Clear, coherent  Affect:  Full range  Mood:  anxious  Thought process:  normal  Thought content:   WNL  Sensory/Perceptual disturbances:   WNL  Orientation:  x4  Attention:  Good  Concentration:  Good  Memory:  WNL  Fund of knowledge:   Good  Insight:   Fair  Judgment:   Fair  Impulse Control:  Fair   Reported Symptoms:  Decreased sleep intermittently, some anxiety and irritability, mood swings   Risk Assessment: Danger to Self:  No Self-injurious Behavior: No Danger to Others: No Duty to Warn:no Physical Aggression / Violence:No  Access to Firearms a concern: No  Gang Involvement:No  Patient / guardian was educated about steps to take if suicide or homicide risk level increases between visits: yes While future psychiatric events cannot be accurately predicted, the patient does not currently require acute inpatient psychiatric care and does not currently meet [facility] involuntary commitment criteria.  Medications: Current Outpatient Medications  Medication Sig Dispense Refill  . b complex vitamins tablet Take 1 tablet by mouth daily. 100 tablet 3  . celecoxib (CELEBREX) 200 MG capsule TAKE (1) CAPSULE TWICE DAILY. 180 capsule 2  . Cholecalciferol (VITAMIN D) 2000 units CAPS 1 daily 100 capsule 3  . Cholecalciferol (VITAMIN D3) 1.25 MG (50000 UT) CAPS Take 1 capsule by mouth once a week. 6 capsule 0  . clonazePAM (KLONOPIN) 0.5 MG tablet TAKE 1 TABLET THREE TIMES DAILY AS NEEDED FOR ANXIETY. 60 tablet 0  . CONCERTA 36 MG CR tablet TAKE 1 TABLET ONCE DAILY. 30 tablet 0  . divalproex (DEPAKOTE ER) 250 MG 24 hr tablet Take 1 tablet (250 mg total) by mouth at bedtime. 90  tablet 0  . divalproex (DEPAKOTE) 500 MG DR tablet TAKE 2 TABLETS AT BEDTIME. 180 tablet 0  . esomeprazole (NEXIUM) 40 MG capsule Take 1 capsule (40 mg total) by mouth daily before breakfast. 90 capsule 4  . HUMIRA PEN 40 MG/0.4ML PNKT Inject 40 mg into the muscle every 14 (fourteen) days.   1  . hydrocortisone 2.5 % ointment     . lamoTRIgine (LAMICTAL) 200 MG tablet TAKE 1 TABLET ONCE DAILY. 90 tablet 0  . methylphenidate (CONCERTA) 36 MG PO CR tablet Take 1 tablet (36 mg total) by mouth daily. 30 tablet 0  . methylphenidate (RITALIN) 10 MG tablet Take 1 tablet (10 mg total) by mouth 3 (three) times daily with meals. 90 tablet 0  . methylphenidate (RITALIN) 10 MG tablet Take 1 tablet (10 mg total) by mouth 3 (three) times daily with meals. 90 tablet 0  . methylphenidate (RITALIN) 10 MG tablet Take 1 tablet (10 mg total) by mouth 3 (three) times daily with meals. 90 tablet 0  . methylphenidate (RITALIN) 10 MG tablet TAKE 1 TABLET 3 TIMES DAILY WITH MEALS. 90 tablet 0  . methylphenidate (RITALIN) 10 MG tablet TAKE 1 TABLET 3 TIMES DAILY WITH MEALS. 90 tablet 0  . METHYLPHENIDATE 36 MG PO CR tablet TAKE 1 TABLET ONCE DAILY. 30 tablet 0   No current facility-administered medications for this visit.     Allergies  Allergen Reactions  . Nsaids Other (See Comments)    Constipation   Subjective: Patient engaged in  session in no distress.  Discussed progress. He shared how how he has been examining relationships lately, has not been in a romantic relationship for the past 15 years. He wants to further explore what he really wants. 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   he has planned surgeries approaching in the next month.  Assisted him in identifying and outlining some reasonable, achievable steps to take toward this goal.  Patient was able to identify and process feelings related to his distance he intentionally kept from starting other relationships at this point in his life.   He shared some past history, challenges and relationships that have contributed to his depressive episodes circumstantially while also coping with the instability of his mood which current medications have been helpful.  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  Interventions: CBT, supportive therapy, problem solving  Diagnoses:    ICD-10-CM   1. Bipolar I disorder, current or most recent episode hypomanic ([place])  F31.0    Plan: Patient is to use CBT, mindfulness and coping skills to help manage decrease symptoms associated with their diagnosis.  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   Long-term goal:   Reduce overall level, frequency, and intensity of the feelings of depression, anxiety evidenced by  decreased irritability, negative self talk, and improved functioning up to 80% of the time as reported by patient for at least 3 consecutive months.   Short-term goal:  Verbally express understanding of the relationship between feelings of depression, anxiety and their impact on thinking patterns and behaviors. Verbalize an understanding of the role that distorted thinking plays in creating fears, excessive worry, and ruminations. 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 Patient to report any concerns with his medications to his prescribing provider as needed.   Progress: progressing  Anson [place], [facility]

## 2019-05-19 NOTE — Telephone Encounter (Signed)
appt today

## 2019-05-26 DIAGNOSIS — M9901 Segmental and somatic dysfunction of cervical region: Secondary | ICD-10-CM | POA: Diagnosis not present

## 2019-05-26 DIAGNOSIS — M9907 Segmental and somatic dysfunction of upper extremity: Secondary | ICD-10-CM | POA: Diagnosis not present

## 2019-05-26 DIAGNOSIS — M9902 Segmental and somatic dysfunction of thoracic region: Secondary | ICD-10-CM | POA: Diagnosis not present

## 2019-05-26 DIAGNOSIS — M9903 Segmental and somatic dysfunction of lumbar region: Secondary | ICD-10-CM | POA: Diagnosis not present

## 2019-05-31 ENCOUNTER — Encounter: Payer: Self-pay | Admitting: Orthopedic Surgery

## 2019-05-31 DIAGNOSIS — M25871 Other specified joint disorders, right ankle and foot: Secondary | ICD-10-CM

## 2019-05-31 MED ORDER — LIDOCAINE HCL 1 % IJ SOLN
2.0000 mL | INTRAMUSCULAR | Status: AC | PRN
  Administered 2019-05-31: 14:00:00 2 mL

## 2019-05-31 MED ORDER — METHYLPREDNISOLONE ACETATE 40 MG/ML IJ SUSP
40.0000 mg | INTRAMUSCULAR | Status: AC | PRN
  Administered 2019-05-31: 14:00:00 40 mg via INTRA_ARTICULAR
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
## 2019-06-02 ENCOUNTER — Other Ambulatory Visit: Payer: Self-pay

## 2019-06-02 ENCOUNTER — Ambulatory Visit (INDEPENDENT_AMBULATORY_CARE_PROVIDER_SITE_OTHER): Payer: BC Managed Care – PPO | Admitting: Mental Health

## 2019-06-02 DIAGNOSIS — M9907 Segmental and somatic dysfunction of upper extremity: Secondary | ICD-10-CM | POA: Diagnosis not present

## 2019-06-02 DIAGNOSIS — M9902 Segmental and somatic dysfunction of thoracic region: Secondary | ICD-10-CM | POA: Diagnosis not present

## 2019-06-02 DIAGNOSIS — M9901 Segmental and somatic dysfunction of cervical region: Secondary | ICD-10-CM | POA: Diagnosis not present

## 2019-06-02 DIAGNOSIS — F310 Bipolar disorder, current episode hypomanic: Secondary | ICD-10-CM

## 2019-06-02 DIAGNOSIS — M9903 Segmental and somatic dysfunction of lumbar region: Secondary | ICD-10-CM | POA: Diagnosis not present

## 2019-06-02 NOTE — Progress Notes (Signed)
Psychotherapy Note  Name: Francisco Morgan Date: 06/19/2019 MRN: [id number] DOB: 22-Nov-1975 PCP: [name], MD  Time spent: 54 minutes  Treatment: Individual therapy  Mental Status Exam: Appearance:   casual  Behavior:  appropriate  Motor:  WNL  Speech/Language:   Clear, coherent  Affect:  Full range  Mood:  anxious  Thought process:  normal  Thought content:   WNL  Sensory/Perceptual disturbances:   WNL  Orientation:  x4  Attention:  Good  Concentration:  Good  Memory:  WNL  Fund of knowledge:   Good  Insight:   Fair  Judgment:   Fair  Impulse Control:  Fair   Reported Symptoms:  Decreased sleep intermittently, some anxiety and irritability  Risk Assessment: Danger to Self:  No Self-injurious Behavior: No Danger to Others: No Duty to Warn:no Physical Aggression / Violence:No  Access to Firearms a concern: No  Gang Involvement:No  Patient / guardian was educated about steps to take if suicide or homicide risk level increases between visits: yes While future psychiatric events cannot be accurately predicted, the patient does not currently require acute inpatient psychiatric care and does not currently meet [facility] involuntary commitment criteria.  Medications: Current Outpatient Medications  Medication Sig Dispense Refill  . b complex vitamins tablet Take 1 tablet by mouth daily. 100 tablet 3  . celecoxib (CELEBREX) 200 MG capsule TAKE (1) CAPSULE TWICE DAILY. 180 capsule 2  . Cholecalciferol (VITAMIN D) 2000 units CAPS 1 daily 100 capsule 3  . Cholecalciferol (VITAMIN D3) 1.25 MG (50000 UT) CAPS Take 1 capsule by mouth once a week. 6 capsule 0  . clonazePAM (KLONOPIN) 0.5 MG tablet TAKE 1 TABLET THREE TIMES DAILY AS NEEDED FOR ANXIETY. 60 tablet 0  . CONCERTA 36 MG CR tablet TAKE 1 TABLET ONCE DAILY. 30 tablet 0  . divalproex (DEPAKOTE ER) 250 MG 24 hr tablet Take 1 tablet (250 mg total) by mouth at bedtime. 90 tablet 0  .  divalproex (DEPAKOTE) 500 MG DR tablet TAKE 2 TABLETS AT BEDTIME. 180 tablet 0  . esomeprazole (NEXIUM) 40 MG capsule Take 1 capsule (40 mg total) by mouth daily before breakfast. 90 capsule 4  . HUMIRA PEN 40 MG/0.4ML PNKT Inject 40 mg into the muscle every 14 (fourteen) days.   1  . hydrocortisone 2.5 % ointment     . lamoTRIgine (LAMICTAL) 200 MG tablet TAKE 1 TABLET ONCE DAILY. 90 tablet 0  . methylphenidate (CONCERTA) 36 MG PO CR tablet Take 1 tablet (36 mg total) by mouth daily. 30 tablet 0  . methylphenidate (RITALIN) 10 MG tablet Take 1 tablet (10 mg total) by mouth 3 (three) times daily with meals. 90 tablet 0  . methylphenidate (RITALIN) 10 MG tablet Take 1 tablet (10 mg total) by mouth 3 (three) times daily with meals. 90 tablet 0  . methylphenidate (RITALIN) 10 MG tablet Take 1 tablet (10 mg total) by mouth 3 (three) times daily with meals. 90 tablet 0  . methylphenidate (RITALIN) 10 MG tablet TAKE 1 TABLET 3 TIMES DAILY WITH MEALS. 90 tablet 0  . methylphenidate (RITALIN) 10 MG tablet TAKE 1 TABLET 3 TIMES DAILY WITH MEALS. 90 tablet 0  . METHYLPHENIDATE 36 MG PO CR tablet TAKE 1 TABLET ONCE DAILY. 30 tablet 0   No current facility-administered medications for this visit.     Allergies  Allergen Reactions  . Nsaids Other (See Comments)    Constipation   Subjective: Patient presented on time for today's  session.  He shared progress at with one of his parents homes in which she is continuing to renovate.  Patient shared how he tries to balance his work demands while also getting enough rest.  Patient stated that he can work for 2 to 3 days at a time and then need arise for about the same.  Patient stated that he is taking his medications as prescribed and feels they are effective.  Through guided discovery, patient processed feelings related to how he feels he has viewed by his parents both individually and collectively versus his sister.  He stated that he has not spoken to his  sister in approximately 1 year.  He stated that they often have a contentious relationship, going on to provide some history and most recent details.  Assisted him in identifying current steps he is taking to achieve personal goals recently, to validate his efforts throughout the week.  Interventions: CBT, supportive therapy, problem solving  Diagnoses:    ICD-10-CM   1. Bipolar I disorder, current or most recent episode hypomanic ([place])  F31.0     Plan: Patient is to use CBT, mindfulness and coping skills to help manage decrease symptoms associated with their diagnosis.  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   Long-term goal:   Reduce overall level, frequency, and intensity of the feelings of depression, anxiety evidenced by  decreased irritability, negative self talk, and improved functioning up to 80% of the time as reported by patient for at least 3 consecutive months.    Short-term goal:  Verbally express understanding of the relationship between feelings of depression, anxiety and their impact on thinking patterns and behaviors. Verbalize an understanding of the role that distorted thinking plays in creating fears, excessive worry, and ruminations. 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       Patient to report any concerns with his medications to his prescribing provider as needed.  Progress: progressing   Anson [place], [facility]

## 2019-06-07 ENCOUNTER — Other Ambulatory Visit: Payer: Self-pay | Admitting: Psychiatry

## 2019-06-07 DIAGNOSIS — F310 Bipolar disorder, current episode hypomanic: Secondary | ICD-10-CM

## 2019-06-09 DIAGNOSIS — M9901 Segmental and somatic dysfunction of cervical region: Secondary | ICD-10-CM | POA: Diagnosis not present

## 2019-06-09 DIAGNOSIS — M9902 Segmental and somatic dysfunction of thoracic region: Secondary | ICD-10-CM | POA: Diagnosis not present

## 2019-06-09 DIAGNOSIS — M9903 Segmental and somatic dysfunction of lumbar region: Secondary | ICD-10-CM | POA: Diagnosis not present

## 2019-06-09 DIAGNOSIS — M9907 Segmental and somatic dysfunction of upper extremity: Secondary | ICD-10-CM | POA: Diagnosis not present

## 2019-06-16 ENCOUNTER — Other Ambulatory Visit: Payer: Self-pay | Admitting: Psychiatry

## 2019-06-16 ENCOUNTER — Other Ambulatory Visit: Payer: Self-pay

## 2019-06-16 ENCOUNTER — Ambulatory Visit (INDEPENDENT_AMBULATORY_CARE_PROVIDER_SITE_OTHER): Payer: BC Managed Care – PPO | Admitting: Mental Health

## 2019-06-16 DIAGNOSIS — F310 Bipolar disorder, current episode hypomanic: Secondary | ICD-10-CM

## 2019-06-16 DIAGNOSIS — F902 Attention-deficit hyperactivity disorder, combined type: Secondary | ICD-10-CM

## 2019-06-16 NOTE — Progress Notes (Signed)
Psychotherapy Note  Name: Francisco Morgan Date: 06/16/19 MRN: [id number] DOB: 09-26-1975 PCP: [name], MD  Time spent: 54 minutes  Treatment: Individual therapy  Mental Status Exam: Appearance:   casual  Behavior:  appropriate  Motor:  WNL  Speech/Language:   Clear, coherent  Affect:  Full range  Mood:  anxious  Thought process:  normal  Thought content:   WNL  Sensory/Perceptual disturbances:   WNL  Orientation:  x4  Attention:  Good  Concentration:  Good  Memory:  WNL  Fund of knowledge:   Good  Insight:   Fair  Judgment:   Fair  Impulse Control:  Fair   Reported Symptoms:  Decreased sleep intermittently, some anxiety and irritability, mood swings, negative self talk  Risk Assessment: Danger to Self:  No Self-injurious Behavior: No Danger to Others: No Duty to Warn:no Physical Aggression / Violence:No  Access to Firearms a concern: No  Gang Involvement:No  Patient / guardian was educated about steps to take if suicide or homicide risk level increases between visits: yes While future psychiatric events cannot be accurately predicted, the patient does not currently require acute inpatient psychiatric care and does not currently meet [facility] involuntary commitment criteria.  Medications: Current Outpatient Medications  Medication Sig Dispense Refill  . b complex vitamins tablet Take 1 tablet by mouth daily. 100 tablet 3  . celecoxib (CELEBREX) 200 MG capsule TAKE (1) CAPSULE TWICE DAILY. 180 capsule 2  . Cholecalciferol (VITAMIN D) 2000 units CAPS 1 daily 100 capsule 3  . Cholecalciferol (VITAMIN D3) 1.25 MG (50000 UT) CAPS Take 1 capsule by mouth once a week. 6 capsule 0  . clonazePAM (KLONOPIN) 0.5 MG tablet TAKE 1 TABLET THREE TIMES DAILY AS NEEDED FOR ANXIETY. 60 tablet 0  . CONCERTA 36 MG CR tablet TAKE 1 TABLET ONCE DAILY. 30 tablet 0  . divalproex (DEPAKOTE ER) 250 MG 24 hr tablet Take 1 tablet (250 mg total) by mouth  at bedtime. 90 tablet 0  . divalproex (DEPAKOTE) 500 MG DR tablet TAKE 2 TABLETS AT BEDTIME. 180 tablet 0  . esomeprazole (NEXIUM) 40 MG capsule Take 1 capsule (40 mg total) by mouth daily before breakfast. 90 capsule 4  . HUMIRA PEN 40 MG/0.4ML PNKT Inject 40 mg into the muscle every 14 (fourteen) days.   1  . hydrocortisone 2.5 % ointment     . lamoTRIgine (LAMICTAL) 200 MG tablet TAKE 1 TABLET ONCE DAILY. 90 tablet 0  . methylphenidate (CONCERTA) 36 MG PO CR tablet Take 1 tablet (36 mg total) by mouth daily. 30 tablet 0  . methylphenidate (RITALIN) 10 MG tablet Take 1 tablet (10 mg total) by mouth 3 (three) times daily with meals. 90 tablet 0  . methylphenidate (RITALIN) 10 MG tablet Take 1 tablet (10 mg total) by mouth 3 (three) times daily with meals. 90 tablet 0  . methylphenidate (RITALIN) 10 MG tablet Take 1 tablet (10 mg total) by mouth 3 (three) times daily with meals. 90 tablet 0  . methylphenidate (RITALIN) 10 MG tablet TAKE 1 TABLET 3 TIMES DAILY WITH MEALS. 90 tablet 0  . methylphenidate (RITALIN) 10 MG tablet TAKE 1 TABLET 3 TIMES DAILY WITH MEALS. 90 tablet 0  . METHYLPHENIDATE 36 MG PO CR tablet TAKE 1 TABLET ONCE DAILY. 30 tablet 0   No current facility-administered medications for this visit.     Allergies  Allergen Reactions  . Nsaids Other (See Comments)    Constipation   Subjective: Patient  engaged in session in no distress.  Continues to have some financial stress, plans to get started w/ real estate work. Family relationships were assessed.  Patient went on to share more history related to the relational strain he typically has with his mother and somewhat this continues to present day.  Patient has been able to follow through with taking steps toward reestablishing his career in real estate.  Patient went on to share details as well as feelings related as he is making some significant life changes.  Some feelings of frustration will share related to his past real estate  employer and how they were not consistent with his contract in some areas.  Through guided discovery, patient identified the differences between him and his sister, how he is felt compared to her by his parents at times and ultimately how his mother often embraces his brother-in-law and family due to their hiving significant wealth in status.  Patient was encouraged to continue to focus on steps he is taking for himself to ultimately increase his financial independence and future wife options.  Interventions: CBT, supportive therapy, problem solving  Diagnoses:    ICD-10-CM   1. Bipolar I disorder, current or most recent episode hypomanic ([place])  F31.0     Plan: Patient is to use CBT, mindfulness and coping skills to help manage decrease symptoms. Patient is to i sense of independence and improve his    Long-term goal:  Reduce overall level, frequency, and intensity of his mood swings, feelings of depression and anxiety so that daily functioning up to 80% of the time for at least 3 consecutive months per patient report.  Short-term goal: To identify and process feelings related to the disappointment of past painful events that increase worthless feelings.                   Verbally express understanding of the relationship between depressed mood and repression of feelings - such as anger, hurt, and sadness.                        Verbalize an understanding of the role that distorted thinking plays in creating fears, excessive worry, and ruminations.        Increase financial independence         Report any medication concerns as needed.  Assessment of progress:  progressing    Anson [place], [facility]

## 2019-06-17 NOTE — Telephone Encounter (Signed)
Next apt 12/22

## 2019-06-30 ENCOUNTER — Ambulatory Visit (INDEPENDENT_AMBULATORY_CARE_PROVIDER_SITE_OTHER): Payer: BC Managed Care – PPO | Admitting: Mental Health

## 2019-06-30 ENCOUNTER — Other Ambulatory Visit: Payer: Self-pay

## 2019-06-30 DIAGNOSIS — F310 Bipolar disorder, current episode hypomanic: Secondary | ICD-10-CM | POA: Diagnosis not present

## 2019-06-30 NOTE — Progress Notes (Signed)
Psychotherapy Note  Name: Francisco Morgan Date: 07/01/2019 MRN: [id number] DOB: 1975-12-28 PCP: [name], MD  Time spent: 54 minutes  Treatment: Individual therapy  Mental Status Exam: Appearance:   casual  Behavior:  appropriate  Motor:  WNL  Speech/Language:   Clear, coherent  Affect:  Full range  Mood:  anxious  Thought process:  normal  Thought content:   WNL  Sensory/Perceptual disturbances:   WNL  Orientation:  x4  Attention:  Good  Concentration:  Good  Memory:  WNL  Fund of knowledge:   Good  Insight:   Fair  Judgment:   Fair  Impulse Control:  Fair   Reported Symptoms:  Decreased sleep intermittently, some anxiety and irritability, ruminations  Risk Assessment: Danger to Self:  No Self-injurious Behavior: No Danger to Others: No Duty to Warn:no Physical Aggression / Violence:No  Access to Firearms a concern: No  Gang Involvement:No  Patient / guardian was educated about steps to take if suicide or homicide risk level increases between visits: yes While future psychiatric events cannot be accurately predicted, the patient does not currently require acute inpatient psychiatric care and does not currently meet [facility] involuntary commitment criteria.  Medications: Current Outpatient Medications  Medication Sig Dispense Refill  . b complex vitamins tablet Take 1 tablet by mouth daily. 100 tablet 3  . celecoxib (CELEBREX) 200 MG capsule TAKE (1) CAPSULE TWICE DAILY. 180 capsule 2  . Cholecalciferol (VITAMIN D) 2000 units CAPS 1 daily 100 capsule 3  . Cholecalciferol (VITAMIN D3) 1.25 MG (50000 UT) CAPS Take 1 capsule by mouth once a week. 6 capsule 0  . clonazePAM (KLONOPIN) 0.5 MG tablet TAKE 1 TABLET THREE TIMES DAILY AS NEEDED FOR ANXIETY. 60 tablet 0  . CONCERTA 36 MG CR tablet TAKE 1 TABLET ONCE DAILY. 30 tablet 0  . divalproex (DEPAKOTE ER) 250 MG 24 hr tablet Take 1 tablet (250 mg total) by mouth at bedtime. 90  tablet 0  . divalproex (DEPAKOTE) 500 MG DR tablet TAKE 2 TABLETS AT BEDTIME. 180 tablet 0  . esomeprazole (NEXIUM) 40 MG capsule Take 1 capsule (40 mg total) by mouth daily before breakfast. 90 capsule 4  . HUMIRA PEN 40 MG/0.4ML PNKT Inject 40 mg into the muscle every 14 (fourteen) days.   1  . hydrocortisone 2.5 % ointment     . lamoTRIgine (LAMICTAL) 200 MG tablet TAKE 1 TABLET ONCE DAILY. 90 tablet 0  . methylphenidate (CONCERTA) 36 MG PO CR tablet Take 1 tablet (36 mg total) by mouth daily. 30 tablet 0  . methylphenidate (RITALIN) 10 MG tablet Take 1 tablet (10 mg total) by mouth 3 (three) times daily with meals. 90 tablet 0  . methylphenidate (RITALIN) 10 MG tablet Take 1 tablet (10 mg total) by mouth 3 (three) times daily with meals. 90 tablet 0  . methylphenidate (RITALIN) 10 MG tablet Take 1 tablet (10 mg total) by mouth 3 (three) times daily with meals. 90 tablet 0  . methylphenidate (RITALIN) 10 MG tablet TAKE 1 TABLET 3 TIMES DAILY WITH MEALS. 90 tablet 0  . methylphenidate (RITALIN) 10 MG tablet TAKE 1 TABLET 3 TIMES DAILY WITH MEALS. 90 tablet 0  . METHYLPHENIDATE 36 MG PO CR tablet TAKE 1 TABLET ONCE DAILY. 30 tablet 0   No current facility-administered medications for this visit.     Allergies  Allergen Reactions  . Nsaids Other (See Comments)    Constipation   Subjective: Patient arrived for today's session  in no distress.  He shared progress.  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  He continues to do himself for his reentry into real estate as he has obtained employment with a Paediatric nurse.  Patient went on to process recent family experiences with a focus on his relationship with his sister which is strained as they have not spoken for about 1 year.  Continues to see doctors to obtain medical care as needed for various injuries that have been exacerbated by some of the physical labor he is engaged in over the years and  most recently.  Through guided discovery, identified and processed some feelings related to how his life has not been what he had hoped for in some ways, feels his intellectual capacity has been diminished to an extent due to having to take medications, prides himself on his history of being intelligent, early language arts development reporting ability to read by age 54-1/2.  Patient was encouraged to focus on his efforts and perseverance toward taking steps toward present life goals, encouraged him to remind himself of this progress as well as filtering some negative comments that can occur at times from his family primarily his mother.  Interventions: CBT, supportive therapy, problem solving  Diagnoses:    ICD-10-CM   1. Bipolar I disorder, current or most recent episode hypomanic ([place])  F31.0     Plan: Patient is to use CBT, mindfulness and coping skills to help manage decrease symptoms associated with their diagnosis.  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   Long-term goal:   Reduce overall level, frequency, and intensity of the feelings of depression, anxiety evidenced by  decreased irritability, negative self talk, and improved functioning up to 80% of the time as reported by patient for at least 3 consecutive months.    Short-term goal:  Verbally express understanding of the relationship between feelings of depression, anxiety and their impact on thinking patterns and behaviors. Verbalize an understanding of the role that distorted thinking plays in creating fears, excessive worry, and ruminations. 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       Patient to report any  concerns with his medications to his prescribing provider as needed.  Progress: progressing   Anson [place], [facility]

## 2019-07-14 ENCOUNTER — Ambulatory Visit: Payer: BC Managed Care – PPO | Admitting: Mental Health

## 2019-07-15 ENCOUNTER — Other Ambulatory Visit: Payer: Self-pay | Admitting: Psychiatry

## 2019-07-15 DIAGNOSIS — F310 Bipolar disorder, current episode hypomanic: Secondary | ICD-10-CM

## 2019-07-16 ENCOUNTER — Other Ambulatory Visit: Payer: Self-pay | Admitting: Psychiatry

## 2019-07-17 NOTE — Telephone Encounter (Signed)
Apt 12/22 

## 2019-07-19 ENCOUNTER — Encounter: Payer: Self-pay | Admitting: Psychiatry

## 2019-07-19 ENCOUNTER — Ambulatory Visit (INDEPENDENT_AMBULATORY_CARE_PROVIDER_SITE_OTHER): Payer: BC Managed Care – PPO | Admitting: Psychiatry

## 2019-07-19 DIAGNOSIS — F4310 Post-traumatic stress disorder, unspecified: Secondary | ICD-10-CM | POA: Diagnosis not present

## 2019-07-19 DIAGNOSIS — F902 Attention-deficit hyperactivity disorder, combined type: Secondary | ICD-10-CM

## 2019-07-19 DIAGNOSIS — F310 Bipolar disorder, current episode hypomanic: Secondary | ICD-10-CM
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
## 2019-07-28 ENCOUNTER — Ambulatory Visit (INDEPENDENT_AMBULATORY_CARE_PROVIDER_SITE_OTHER): Payer: BC Managed Care – PPO | Admitting: Mental Health

## 2019-07-28 DIAGNOSIS — F310 Bipolar disorder, current episode hypomanic: Secondary | ICD-10-CM

## 2019-07-28 NOTE — Progress Notes (Signed)
Psychotherapy Note  Name: Francisco Morgan Date: 07/28/2019 MRN: [id number] DOB: Feb 08, 1976 PCP: Francisco Anger, MD  Time spent: 58 minutes  Treatment: Individual therapy  Mental Status Exam: Appearance:   casual  Behavior:  appropriate  Motor:  WNL  Speech/Language:   Clear, coherent  Affect:  Full range  Mood:  anxious  Thought process:  normal  Thought content:   WNL  Sensory/Perceptual disturbances:   WNL  Orientation:  x4  Attention:  Good  Concentration:  Good  Memory:  WNL  Fund of knowledge:   Good  Insight:   Fair  Judgment:   Fair  Impulse Control:  Fair   Reported Symptoms:  Decreased sleep intermittently, some anxiety and irritability, ruminations  Risk Assessment: Danger to Self:  No Self-injurious Behavior: No Danger to Others: No Duty to Warn:no Physical Aggression / Violence:No  Access to Firearms a concern: No  Gang Involvement:No  Patient / guardian was educated about steps to take if suicide or homicide risk level increases between visits: yes While future psychiatric events cannot be accurately predicted, the patient does not currently require acute inpatient psychiatric care and does not currently meet [facility] involuntary commitment criteria.  Medications: Current Outpatient Medications  Medication Sig Dispense Refill  . b complex vitamins tablet Take 1 tablet by mouth daily. 100 tablet 3  . celecoxib (CELEBREX) 200 MG capsule TAKE (1) CAPSULE TWICE DAILY. 180 capsule 2  . Cholecalciferol (VITAMIN D) 2000 units CAPS 1 daily 100 capsule 3  . Cholecalciferol (VITAMIN D3) 1.25 MG (50000 UT) CAPS Take 1 capsule by mouth once a week. 6 capsule 0  . clonazePAM (KLONOPIN) 0.5 MG tablet TAKE 1 TABLET THREE TIMES DAILY AS NEEDED FOR ANXIETY. 60 tablet 0  . CONCERTA 36 MG CR tablet TAKE 1 TABLET ONCE DAILY. 30 tablet 0  . divalproex (DEPAKOTE ER) 250 MG 24 hr tablet TAKE ONE TABLET AT BEDTIME. 90 tablet 0  . divalproex  (DEPAKOTE) 500 MG DR tablet TAKE 2 TABLETS AT BEDTIME. 180 tablet 0  . esomeprazole (NEXIUM) 40 MG capsule Take 1 capsule (40 mg total) by mouth daily before breakfast. 90 capsule 4  . HUMIRA PEN 40 MG/0.4ML PNKT Inject 40 mg into the muscle every 14 (fourteen) days.   1  . hydrocortisone 2.5 % ointment     . lamoTRIgine (LAMICTAL) 200 MG tablet TAKE 1 TABLET ONCE DAILY. 90 tablet 0  . methylphenidate (CONCERTA) 36 MG PO CR tablet Take 1 tablet (36 mg total) by mouth daily. 30 tablet 0  . methylphenidate (RITALIN) 10 MG tablet Take 1 tablet (10 mg total) by mouth 3 (three) times daily with meals. 90 tablet 0  . methylphenidate (RITALIN) 10 MG tablet Take 1 tablet (10 mg total) by mouth 3 (three) times daily with meals. 90 tablet 0  . methylphenidate (RITALIN) 10 MG tablet Take 1 tablet (10 mg total) by mouth 3 (three) times daily with meals. 90 tablet 0  . methylphenidate (RITALIN) 10 MG tablet TAKE 1 TABLET 3 TIMES DAILY WITH MEALS. 90 tablet 0  . methylphenidate (RITALIN) 10 MG tablet TAKE 1 TABLET 3 TIMES DAILY WITH MEALS. 90 tablet 0  . methylphenidate (RITALIN) 10 MG tablet TAKE 1 TABLET 3 TIMES DAILY WITH MEALS. 90 tablet 0  . METHYLPHENIDATE 36 MG PO CR tablet TAKE 1 TABLET ONCE DAILY. 30 tablet 0  . METHYLPHENIDATE 36 MG PO CR tablet TAKE 1 TABLET ONCE DAILY. 30 tablet 0   No current facility-administered medications  for this visit.    Allergies  Allergen Reactions  . Nsaids Other (See Comments)    Constipation   Subjective:  Pt engaged in telehealth session.  Discussed progress, recent events.  He continues to work with his father on one of their properties and home repair to ready the house for sale.  Stated that his father continues to tell him about discontinuing the project and letting a Chief Strategy Officer completed.  Patient stated this was confirmed again recently about a week or so ago.  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  Patient stated that his father wants the house completed at least by March 1 to be put on the market however patient admittedly given in session cannot commit to this date.  Patient was encouraged to recognize effective ways to communicate with his father as he stated his father will possibly get more upset and still lives on if it is not listed at the time indicated.  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  He stated he plans to ready himself for the next quarter upon which the real estate market will began to flourish and patient can reestablish himself in his career.  Encouragement was provided throughout with a focus of allowing patient to recognize what he can control ultimately which is taking steps toward his career and allowing his transition with his father and the property to occur.  Interventions: CBT, supportive therapy, problem solving  Diagnoses:    ICD-10-CM   1. Bipolar I disorder, current or most recent episode hypomanic ([place])  F31.0     Plan: Patient is to use CBT, mindfulness and coping skills to help manage decrease symptoms associated with their diagnosis.  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   Long-term goal:   Reduce overall level, frequency, and intensity of the feelings of depression, anxiety evidenced by  decreased irritability, negative self talk, and improved functioning up to 80% of the time as reported by patient for at least 3 consecutive months.    Short-term goal:  Verbally express understanding of the relationship between feelings of depression, anxiety and their impact on thinking  patterns and behaviors. Verbalize an understanding of the role that distorted thinking plays in creating fears, excessive worry, and ruminations. 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       Patient to report any concerns with his medications to his prescribing provider as needed.  Progress: progressing   Anson [place], [facility]

## 2019-08-11 ENCOUNTER — Other Ambulatory Visit: Payer: Self-pay | Admitting: Psychiatry

## 2019-08-11 ENCOUNTER — Ambulatory Visit (INDEPENDENT_AMBULATORY_CARE_PROVIDER_SITE_OTHER): Payer: BC Managed Care – PPO | Admitting: Mental Health

## 2019-08-11 DIAGNOSIS — F310 Bipolar disorder, current episode hypomanic: Secondary | ICD-10-CM | POA: Diagnosis not present

## 2019-08-11 NOTE — Progress Notes (Signed)
Psychotherapy Note  Name: Francisco Morgan Date: 08/11/2019 MRN: [id number] DOB: 09-07-1975 PCP: [name], MD  Time spent: 54 minutes  Treatment: Individual therapy  Virtual Visit via Telephone Note Connected with patient by a video enabled telemedicine/telehealth application or telephone, with their informed consent, and verified patient privacy and that I am speaking with the correct person using two identifiers. I discussed the limitations, risks, security and privacy concerns of performing psychotherapy and management service by telephone and the availability of in person appointments. I also discussed with the patient that there may be a patient responsible charge related to this service. The patient expressed understanding and agreed to proceed. I discussed the treatment planning with the patient. The patient was provided an opportunity to ask questions and all were answered. The patient agreed with the plan and demonstrated an understanding of the instructions. The patient was advised to call  our office if  symptoms worsen or feel they are in a crisis state and need immediate contact.   Therapist Location: home Patient Location: home  Mental Status Exam: Appearance:   casual  Behavior:  appropriate  Motor:  WNL  Speech/Language:   Clear, coherent  Affect:  Full range  Mood:  anxious  Thought process:  normal  Thought content:   WNL  Sensory/Perceptual disturbances:   WNL  Orientation:  x4  Attention:  Good  Concentration:  Good  Memory:  WNL  Fund of knowledge:   Good  Insight:   Fair  Judgment:   Fair  Impulse Control:  Fair   Reported Symptoms:  Decreased sleep intermittently, some anxiety and irritability, ruminations  Risk Assessment: Danger to Self:  No Self-injurious Behavior: No Danger to Others: No Duty to Warn:no Physical Aggression / Violence:No  Access to Firearms a concern: No  Gang Involvement:No  Patient / guardian  was educated about steps to take if suicide or homicide risk level increases between visits: yes While future psychiatric events cannot be accurately predicted, the patient does not currently require acute inpatient psychiatric care and does not currently meet [facility] involuntary commitment criteria.  Medications: Current Outpatient Medications  Medication Sig Dispense Refill  . b complex vitamins tablet Take 1 tablet by mouth daily. 100 tablet 3  . celecoxib (CELEBREX) 200 MG capsule TAKE (1) CAPSULE TWICE DAILY. 180 capsule 2  . Cholecalciferol (VITAMIN D) 2000 units CAPS 1 daily 100 capsule 3  . Cholecalciferol (VITAMIN D3) 1.25 MG (50000 UT) CAPS Take 1 capsule by mouth once a week. 6 capsule 0  . clonazePAM (KLONOPIN) 0.5 MG tablet TAKE 1 TABLET THREE TIMES DAILY AS NEEDED FOR ANXIETY. 60 tablet 0  . CONCERTA 36 MG CR tablet TAKE 1 TABLET ONCE DAILY. 30 tablet 0  . divalproex (DEPAKOTE ER) 250 MG 24 hr tablet TAKE ONE TABLET AT BEDTIME. 90 tablet 0  . divalproex (DEPAKOTE) 500 MG DR tablet TAKE 2 TABLETS AT BEDTIME. 180 tablet 0  . esomeprazole (NEXIUM) 40 MG capsule Take 1 capsule (40 mg total) by mouth daily before breakfast. 90 capsule 4  . HUMIRA PEN 40 MG/0.4ML PNKT Inject 40 mg into the muscle every 14 (fourteen) days.   1  . hydrocortisone 2.5 % ointment     . lamoTRIgine (LAMICTAL) 200 MG tablet TAKE 1 TABLET ONCE DAILY. 90 tablet 0  . methylphenidate (CONCERTA) 36 MG PO CR tablet Take 1 tablet (36 mg total) by mouth daily. 30 tablet 0  . methylphenidate (RITALIN) 10 MG tablet Take 1 tablet (  10 mg total) by mouth 3 (three) times daily with meals. 90 tablet 0  . methylphenidate (RITALIN) 10 MG tablet Take 1 tablet (10 mg total) by mouth 3 (three) times daily with meals. 90 tablet 0  . methylphenidate (RITALIN) 10 MG tablet Take 1 tablet (10 mg total) by mouth 3 (three) times daily with meals. 90 tablet 0  . methylphenidate (RITALIN) 10 MG tablet TAKE 1 TABLET 3 TIMES DAILY  WITH MEALS. 90 tablet 0  . methylphenidate (RITALIN) 10 MG tablet TAKE 1 TABLET 3 TIMES DAILY WITH MEALS. 90 tablet 0  . methylphenidate (RITALIN) 10 MG tablet TAKE 1 TABLET 3 TIMES DAILY WITH MEALS. 90 tablet 0  . METHYLPHENIDATE 36 MG PO CR tablet TAKE 1 TABLET ONCE DAILY. 30 tablet 0  . METHYLPHENIDATE 36 MG PO CR tablet TAKE 1 TABLET ONCE DAILY. 30 tablet 0   No current facility-administered medications for this visit.    Allergies  Allergen Reactions  . Nsaids Other (See Comments)    Constipation   Subjective:  He stated continues to cope w/ financial stress. 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 Continues to work on his parents property to ready it for sale. Had a significant discussion w/ his parents. Continues to disagree about the house and getting it ready for listing in the Spring. He plans to see the home in the Spring, shared how he has invested so much time and skill into the house. The sale will help him gain capital to assist him in his financial recovery and real estate sales future. He continues to express high motivation to complete the project, using his skill set.  Provided support as patient processed feelings related to the recent conversations he has had with his parents.  Encouraged him to continue to be open with his parents with his communication while this will also prepare him for any continued adjustments he will need to make in this coming time of employment transition.  Interventions: CBT, supportive therapy, problem solving  Diagnoses:    ICD-10-CM   1. Bipolar I disorder, current or most recent episode hypomanic ([place])  F31.0     Plan: Patient is to use CBT, mindfulness and coping skills to help manage decrease symptoms associated with their diagnosis.  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   Long-term goal:   Reduce overall level, frequency, and intensity of the feelings of depression, anxiety evidenced by  decreased irritability, negative self talk, and improved functioning up to 80% of the time as reported by patient for at least 3 consecutive months.    Short-term goal:  Verbally express understanding of the relationship between feelings of depression, anxiety and their impact on thinking patterns and behaviors. Verbalize an understanding of the role that distorted thinking plays in creating fears, excessive worry, and ruminations. 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       Patient to report any concerns with his medications to his prescribing provider as needed.  Progress: progressing   Anson [place], [facility]

## 2019-08-16 ENCOUNTER — Other Ambulatory Visit: Payer: Self-pay | Admitting: Psychiatry

## 2019-08-17 ENCOUNTER — Other Ambulatory Visit: Payer: Self-pay | Admitting: Psychiatry

## 2019-08-17 ENCOUNTER — Telehealth: Payer: Self-pay | Admitting: Psychiatry

## 2019-08-17 DIAGNOSIS — F902 Attention-deficit hyperactivity disorder, combined type: Secondary | ICD-10-CM

## 2019-08-17 MED ORDER — METHYLPHENIDATE HCL 10 MG PO TABS: 10.0000 mg | ORAL_TABLET | Freq: Three times a day (TID) | ORAL | 0 refills | Status: DC

## 2019-08-17 MED ORDER — METHYLPHENIDATE HCL ER (OSM) 36 MG PO TBCR: 36.0000 mg | EXTENDED_RELEASE_TABLET | Freq: Every day | ORAL | 0 refills | Status: DC

## 2019-08-17 NOTE — Telephone Encounter (Signed)
Can we please go ahead and send in the next two months of Concerta to [facility]. That way he can just go get it filled when needed.

## 2019-08-17 NOTE — Telephone Encounter (Signed)
Done

## 2019-08-17 NOTE — Telephone Encounter (Signed)
Pt is needing Rx today please.

## 2019-08-25 ENCOUNTER — Ambulatory Visit (INDEPENDENT_AMBULATORY_CARE_PROVIDER_SITE_OTHER): Payer: BC Managed Care – PPO | Admitting: Mental Health

## 2019-08-25 ENCOUNTER — Other Ambulatory Visit: Payer: Self-pay

## 2019-08-25 DIAGNOSIS — F310 Bipolar disorder, current episode hypomanic: Secondary | ICD-10-CM | POA: Diagnosis not present

## 2019-08-25 NOTE — Progress Notes (Signed)
Psychotherapy Note  Name: RYLEE TOOLE Date: 08/25/2019 MRN: [id number] DOB: [birth year] PCP: [name], MD  Time spent: 53 minutes  Treatment: Individual therapy  Mental Status Exam: Appearance:   casual  Behavior:  appropriate  Motor:  WNL  Speech/Language:   Clear, coherent  Affect:  Full range  Mood:  anxious  Thought process:  normal  Thought content:   WNL  Sensory/Perceptual disturbances:   WNL  Orientation:  x4  Attention:  Good  Concentration:  Good  Memory:  WNL  Fund of knowledge:   Good  Insight:   Fair  Judgment:   Fair  Impulse Control:  Fair   Reported Symptoms:  Decreased sleep intermittently, some anxiety and irritability, ruminations  Risk Assessment: Danger to Self:  No Self-injurious Behavior: No Danger to Others: No Duty to Warn:no Physical Aggression / Violence:No  Access to Firearms a concern: No  Gang Involvement:No  Patient / guardian was educated about steps to take if suicide or homicide risk level increases between visits: yes While future psychiatric events cannot be accurately predicted, the patient does not currently require acute inpatient psychiatric care and does not currently meet [facility] involuntary commitment criteria.  Medications: Current Outpatient Medications  Medication Sig Dispense Refill  . b complex vitamins tablet Take 1 tablet by mouth daily. 100 tablet 3  . celecoxib (CELEBREX) 200 MG capsule TAKE (1) CAPSULE TWICE DAILY. 180 capsule 2  . Cholecalciferol (VITAMIN D) 2000 units CAPS 1 daily 100 capsule 3  . Cholecalciferol (VITAMIN D3) 1.25 MG (50000 UT) CAPS Take 1 capsule by mouth once a week. 6 capsule 0  . clonazePAM (KLONOPIN) 0.5 MG tablet TAKE 1 TABLET THREE TIMES DAILY AS NEEDED FOR ANXIETY. 60 tablet 0  . divalproex (DEPAKOTE ER) 250 MG 24 hr tablet TAKE ONE TABLET AT BEDTIME. 90 tablet 0  . divalproex (DEPAKOTE) 500 MG DR tablet TAKE 2 TABLETS AT BEDTIME. 180 tablet 0   . esomeprazole (NEXIUM) 40 MG capsule Take 1 capsule (40 mg total) by mouth daily before breakfast. 90 capsule 4  . HUMIRA PEN 40 MG/0.4ML PNKT Inject 40 mg into the muscle every 14 (fourteen) days.   1  . hydrocortisone 2.5 % ointment     . lamoTRIgine (LAMICTAL) 200 MG tablet TAKE 1 TABLET ONCE DAILY. 90 tablet 0  . [START ON 09/14/2019] methylphenidate (CONCERTA) 36 MG PO CR tablet Take 1 tablet (36 mg total) by mouth daily. 30 tablet 0  . [START ON 10/12/2019] methylphenidate (CONCERTA) 36 MG PO CR tablet Take 1 tablet (36 mg total) by mouth daily. 30 tablet 0  . methylphenidate (RITALIN) 10 MG tablet TAKE 1 TABLET 3 TIMES DAILY WITH MEALS. 90 tablet 0  . methylphenidate (RITALIN) 10 MG tablet TAKE 1 TABLET 3 TIMES DAILY WITH MEALS. 90 tablet 0  . methylphenidate (RITALIN) 10 MG tablet TAKE 1 TABLET 3 TIMES DAILY WITH MEALS. 90 tablet 0  . methylphenidate (RITALIN) 10 MG tablet Take 1 tablet (10 mg total) by mouth 3 (three) times daily with meals. 90 tablet 0  . [START ON 09/14/2019] methylphenidate (RITALIN) 10 MG tablet Take 1 tablet (10 mg total) by mouth 3 (three) times daily with meals. 90 tablet 0  . [START ON 10/12/2019] methylphenidate (RITALIN) 10 MG tablet Take 1 tablet (10 mg total) by mouth 3 (three) times daily with meals. 90 tablet 0  . METHYLPHENIDATE 36 MG PO CR tablet TAKE 1 TABLET ONCE DAILY. 30 tablet 0  . methylphenidate 36  MG PO CR tablet Take 1 tablet (36 mg total) by mouth daily. 30 tablet 0   No current facility-administered medications for this visit.    Allergies  Allergen Reactions  . Nsaids Other (See Comments)    Constipation   Subjective:  Patient arrived on time for today's session in no apparent distress.  He shared progress.  Shared how he continues to work on his parent's property, going on to share several details about his efforts as well as some recent arguments he and his father engaged in.  Patient stated that his father is trying to do manage some  recent tasks on the residence as patient stated he is trying to take some steps away from managing so many tasks as this was discussed previously with his father.  Patient voiced concerns about the property being ready for sale in March which was his father's stated goal.  Patient has taken some steps toward his real estate career development and listing some recent properties.  Through discovery, patient identified needing to continue to allow his father to take over more of the workload with the residents towards making necessary repairs.  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  Encouraged self-care getting enough rest during this time of work transition.  Interventions: CBT, supportive therapy, problem solving  Diagnoses:    ICD-10-CM   1. Bipolar I disorder, current or most recent episode hypomanic ([place])  F31.0     Plan: Patient is to use CBT, mindfulness and coping skills to help manage decrease symptoms associated with their diagnosis.  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   Long-term goal:   Reduce overall level, frequency, and intensity of the feelings of depression, anxiety evidenced by  decreased irritability, negative self talk, and improved functioning up to 80% of the time as reported by patient for at least 3 consecutive months.    Short-term goal:  Verbally express understanding of the relationship between feelings of depression, anxiety and their impact on thinking patterns and behaviors. Verbalize an understanding of the role that distorted thinking plays in creating fears, excessive worry, and ruminations. Patient to follow through with daily goal  setting toward reentry into real estate profession Patient to utilize coping skills as discussed in session.       Patient to report any concerns with his medications to his prescribing provider as needed.  Progress: progressing   Anson [place], [facility]

## 2019-09-08 ENCOUNTER — Other Ambulatory Visit: Payer: Self-pay

## 2019-09-08 ENCOUNTER — Ambulatory Visit (INDEPENDENT_AMBULATORY_CARE_PROVIDER_SITE_OTHER): Payer: BC Managed Care – PPO | Admitting: Mental Health

## 2019-09-08 DIAGNOSIS — F310 Bipolar disorder, current episode hypomanic: Secondary | ICD-10-CM | POA: Diagnosis not present

## 2019-09-08 NOTE — Progress Notes (Signed)
Psychotherapy Note  Name: Francisco Morgan Date: 09/08/2019 MRN: [id number] DOB: April 07, 1976 PCP: [name], MD  Time spent: 55 minutes  Treatment: Individual therapy  Mental Status Exam: Appearance:   casual  Behavior:  appropriate  Motor:  WNL  Speech/Language:   Clear, coherent  Affect:  Full range  Mood:  anxious  Thought process:  normal  Thought content:   WNL  Sensory/Perceptual disturbances:   WNL  Orientation:  x4  Attention:  Good  Concentration:  Good  Memory:  WNL  Fund of knowledge:   Good  Insight:   Fair  Judgment:   Fair  Impulse Control:  Fair   Reported Symptoms:  Decreased sleep intermittently, some anxiety and irritability, ruminations  Risk Assessment: Danger to Self:  No Self-injurious Behavior: No Danger to Others: No Duty to Warn:no Physical Aggression / Violence:No  Access to Firearms a concern: No  Gang Involvement:No  Patient / guardian was educated about steps to take if suicide or homicide risk level increases between visits: yes While future psychiatric events cannot be accurately predicted, the patient does not currently require acute inpatient psychiatric care and does not currently meet [facility] involuntary commitment criteria.  Medications: Current Outpatient Medications  Medication Sig Dispense Refill  . b complex vitamins tablet Take 1 tablet by mouth daily. 100 tablet 3  . celecoxib (CELEBREX) 200 MG capsule TAKE (1) CAPSULE TWICE DAILY. 180 capsule 2  . Cholecalciferol (VITAMIN D) 2000 units CAPS 1 daily 100 capsule 3  . Cholecalciferol (VITAMIN D3) 1.25 MG (50000 UT) CAPS Take 1 capsule by mouth once a week. 6 capsule 0  . clonazePAM (KLONOPIN) 0.5 MG tablet TAKE 1 TABLET THREE TIMES DAILY AS NEEDED FOR ANXIETY. 60 tablet 0  . divalproex (DEPAKOTE ER) 250 MG 24 hr tablet TAKE ONE TABLET AT BEDTIME. 90 tablet 0  . divalproex (DEPAKOTE) 500 MG DR tablet TAKE 2 TABLETS AT BEDTIME. 180 tablet 0   . esomeprazole (NEXIUM) 40 MG capsule Take 1 capsule (40 mg total) by mouth daily before breakfast. 90 capsule 4  . HUMIRA PEN 40 MG/0.4ML PNKT Inject 40 mg into the muscle every 14 (fourteen) days.   1  . hydrocortisone 2.5 % ointment     . lamoTRIgine (LAMICTAL) 200 MG tablet TAKE 1 TABLET ONCE DAILY. 90 tablet 0  . [START ON 09/14/2019] methylphenidate (CONCERTA) 36 MG PO CR tablet Take 1 tablet (36 mg total) by mouth daily. 30 tablet 0  . [START ON 10/12/2019] methylphenidate (CONCERTA) 36 MG PO CR tablet Take 1 tablet (36 mg total) by mouth daily. 30 tablet 0  . methylphenidate (RITALIN) 10 MG tablet TAKE 1 TABLET 3 TIMES DAILY WITH MEALS. 90 tablet 0  . methylphenidate (RITALIN) 10 MG tablet TAKE 1 TABLET 3 TIMES DAILY WITH MEALS. 90 tablet 0  . methylphenidate (RITALIN) 10 MG tablet TAKE 1 TABLET 3 TIMES DAILY WITH MEALS. 90 tablet 0  . methylphenidate (RITALIN) 10 MG tablet Take 1 tablet (10 mg total) by mouth 3 (three) times daily with meals. 90 tablet 0  . [START ON 09/14/2019] methylphenidate (RITALIN) 10 MG tablet Take 1 tablet (10 mg total) by mouth 3 (three) times daily with meals. 90 tablet 0  . [START ON 10/12/2019] methylphenidate (RITALIN) 10 MG tablet Take 1 tablet (10 mg total) by mouth 3 (three) times daily with meals. 90 tablet 0  . METHYLPHENIDATE 36 MG PO CR tablet TAKE 1 TABLET ONCE DAILY. 30 tablet 0  . methylphenidate 36  MG PO CR tablet Take 1 tablet (36 mg total) by mouth daily. 30 tablet 0   No current facility-administered medications for this visit.    Allergies  Allergen Reactions  . Nsaids Other (See Comments)    Constipation   Subjective:  Patient arrived on time for today's session in no distress.  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  Continue to communicate with his parents has been stressful, sharing how he often finds himself having to explain rationale for Proofreader and timeframe toward completion of this project.  Provide support as he processed feelings related.  Some ways to continue to focus on effective communication with implementing some active listening with his parents to provide needed reassurance for them while also articulating his needs to complete the project effectively.  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  Patient plans to follow through between sessions.  Interventions: CBT, supportive therapy, problem solving  Diagnoses:    ICD-10-CM   1. Bipolar I disorder, current or most recent episode hypomanic ([place])  F31.0     Plan: Patient is to use CBT, mindfulness and coping skills to help manage decrease symptoms associated with their diagnosis.  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   Long-term goal:   Reduce overall level, frequency, and intensity of the feelings of depression, anxiety evidenced by  decreased irritability, negative self talk, and improved functioning up to 80% of the time as reported by patient for at least 3 consecutive months.    Short-term goal:  Verbally express understanding of the relationship between feelings of depression, anxiety and their impact on thinking patterns and behaviors. Verbalize an understanding of the role that distorted thinking plays in creating fears, excessive worry, and ruminations. Patient to follow through with daily goal setting toward reentry  into real estate profession Patient to utilize coping skills as discussed in session.       Patient to report any concerns with his medications to his prescribing provider as needed.  Progress: progressing   Anson [place], [place]

## 2019-09-21 DIAGNOSIS — M9903 Segmental and somatic dysfunction of lumbar region: Secondary | ICD-10-CM | POA: Diagnosis not present

## 2019-09-21 DIAGNOSIS — M5137 Other intervertebral disc degeneration, lumbosacral region: Secondary | ICD-10-CM | POA: Diagnosis not present

## 2019-09-21 DIAGNOSIS — M9905 Segmental and somatic dysfunction of pelvic region: Secondary | ICD-10-CM | POA: Diagnosis not present

## 2019-09-21 DIAGNOSIS — M25552 Pain in left hip: Secondary | ICD-10-CM | POA: Diagnosis not present

## 2019-09-22 ENCOUNTER — Other Ambulatory Visit: Payer: Self-pay

## 2019-09-22 ENCOUNTER — Ambulatory Visit (INDEPENDENT_AMBULATORY_CARE_PROVIDER_SITE_OTHER): Payer: BC Managed Care – PPO | Admitting: Mental Health

## 2019-09-22 DIAGNOSIS — F310 Bipolar disorder, current episode hypomanic: Secondary | ICD-10-CM | POA: Diagnosis not present

## 2019-09-22 NOTE — Progress Notes (Signed)
Psychotherapy Note  Name: Francisco Morgan Date: 09/22/2019 MRN: [id number] DOB: 23-Jan-1976 PCP: Francisco Anger, MD  Time spent: 55 minutes  Treatment: Individual therapy  Mental Status Exam: Appearance:   casual  Behavior:  appropriate  Motor:  WNL  Speech/Language:   Clear, coherent  Affect:  Full range  Mood:  anxious  Thought process:  normal  Thought content:   WNL  Sensory/Perceptual disturbances:   WNL  Orientation:  x4  Attention:  Good  Concentration:  Good  Memory:  WNL  Fund of knowledge:   Good  Insight:   Fair  Judgment:   Fair  Impulse Control:  Fair   Reported Symptoms:  Decreased sleep intermittently, some anxiety and irritability, ruminations  Risk Assessment: Danger to Self:  No Self-injurious Behavior: No Danger to Others: No Duty to Warn:no Physical Aggression / Violence:No  Access to Firearms a concern: No  Gang Involvement:No  Patient / guardian was educated about steps to take if suicide or homicide risk level increases between visits: yes While future psychiatric events cannot be accurately predicted, the patient does not currently require acute inpatient psychiatric care and does not currently meet [facility] involuntary commitment criteria.  Medications: Current Outpatient Medications  Medication Sig Dispense Refill  . b complex vitamins tablet Take 1 tablet by mouth daily. 100 tablet 3  . celecoxib (CELEBREX) 200 MG capsule TAKE (1) CAPSULE TWICE DAILY. 180 capsule 2  . Cholecalciferol (VITAMIN D) 2000 units CAPS 1 daily 100 capsule 3  . Cholecalciferol (VITAMIN D3) 1.25 MG (50000 UT) CAPS Take 1 capsule by mouth once a week. 6 capsule 0  . clonazePAM (KLONOPIN) 0.5 MG tablet TAKE 1 TABLET THREE TIMES DAILY AS NEEDED FOR ANXIETY. 60 tablet 0  . divalproex (DEPAKOTE ER) 250 MG 24 hr tablet TAKE ONE TABLET AT BEDTIME. 90 tablet 0  . divalproex (DEPAKOTE) 500 MG DR tablet TAKE 2 TABLETS AT BEDTIME. 180 tablet 0   . esomeprazole (NEXIUM) 40 MG capsule Take 1 capsule (40 mg total) by mouth daily before breakfast. 90 capsule 4  . HUMIRA PEN 40 MG/0.4ML PNKT Inject 40 mg into the muscle every 14 (fourteen) days.   1  . hydrocortisone 2.5 % ointment     . lamoTRIgine (LAMICTAL) 200 MG tablet TAKE 1 TABLET ONCE DAILY. 90 tablet 0  . methylphenidate (CONCERTA) 36 MG PO CR tablet Take 1 tablet (36 mg total) by mouth daily. 30 tablet 0  . [START ON 10/12/2019] methylphenidate (CONCERTA) 36 MG PO CR tablet Take 1 tablet (36 mg total) by mouth daily. 30 tablet 0  . methylphenidate (RITALIN) 10 MG tablet TAKE 1 TABLET 3 TIMES DAILY WITH MEALS. 90 tablet 0  . methylphenidate (RITALIN) 10 MG tablet TAKE 1 TABLET 3 TIMES DAILY WITH MEALS. 90 tablet 0  . methylphenidate (RITALIN) 10 MG tablet TAKE 1 TABLET 3 TIMES DAILY WITH MEALS. 90 tablet 0  . methylphenidate (RITALIN) 10 MG tablet Take 1 tablet (10 mg total) by mouth 3 (three) times daily with meals. 90 tablet 0  . methylphenidate (RITALIN) 10 MG tablet Take 1 tablet (10 mg total) by mouth 3 (three) times daily with meals. 90 tablet 0  . [START ON 10/12/2019] methylphenidate (RITALIN) 10 MG tablet Take 1 tablet (10 mg total) by mouth 3 (three) times daily with meals. 90 tablet 0  . METHYLPHENIDATE 36 MG PO CR tablet TAKE 1 TABLET ONCE DAILY. 30 tablet 0  . methylphenidate 36 MG PO CR tablet Take 1  tablet (36 mg total) by mouth daily. 30 tablet 0   No current facility-administered medications for this visit.    Allergies  Allergen Reactions  . Nsaids Other (See Comments)    Constipation   Subjective:  Patient engaged in session.  Shared progress.  Stated that he continue to work on his parents probably sharing many details.  Continues to struggle at times with having the tools and resources needed while acknowledging his parents have been patient, helpful in the process but the financial aspect of getting this properly ready for sale has been tenuous to the  relationship.  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  Through discovery, he identified feeling misunderstood at times by his parents; the significant knowledge he has in property maintenance and specifically in construction in residential homes has been instrumental in the progress of getting the house ready for sale while he also acknowledges things that he would do different in the process which is taken about 1 to 2 years in total thus far.  His need for validation in the relationship with his parents is at times unmet.  He identified ways he intends to continue to communicate to avoid any arguments.  We encouraged and discussed ways to ensure that he is obtaining adequate rest as he has a tendency to work extended hours and how this can affect his mood.  Patient is following through with setting and achieving goals related to his career.  Interventions: CBT, supportive therapy, problem solving  Diagnoses:    ICD-10-CM   1. Bipolar I disorder, current or most recent episode hypomanic ([place])  F31.0     Plan: Patient is to use CBT, mindfulness and coping skills to help manage decrease symptoms associated with their diagnosis.  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   Long-term goal:   Reduce overall level, frequency, and intensity of the feelings of depression, anxiety evidenced by  decreased irritability, negative self talk, and improved functioning up to 80% of the time as reported by patient for at least 3 consecutive months.    Short-term goal:  Verbally express understanding of the relationship between feelings of depression, anxiety and their impact on thinking patterns and  behaviors. Verbalize an understanding of the role that distorted thinking plays in creating fears, excessive worry, and ruminations. 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       Patient to report any concerns with his medications to his prescribing provider as needed.  Progress: progressing   Anson [place], [facility]

## 2019-09-23 ENCOUNTER — Other Ambulatory Visit: Payer: Self-pay | Admitting: Psychiatry

## 2019-09-23 DIAGNOSIS — F310 Bipolar disorder, current episode hypomanic: Secondary | ICD-10-CM

## 2019-09-26 ENCOUNTER — Ambulatory Visit: Payer: BC Managed Care – PPO | Admitting: Orthopedic Surgery

## 2019-09-26 ENCOUNTER — Encounter: Payer: Self-pay | Admitting: Orthopedic Surgery

## 2019-09-26 ENCOUNTER — Other Ambulatory Visit: Payer: Self-pay

## 2019-09-26 DIAGNOSIS — M25871 Other specified joint disorders, right ankle and foot: Secondary | ICD-10-CM | POA: Diagnosis not present

## 2019-09-26 MED ORDER — METHYLPREDNISOLONE ACETATE 40 MG/ML IJ SUSP
40.0000 mg | INTRAMUSCULAR | Status: AC | PRN
  Administered 2019-09-26: 40 mg via INTRA_ARTICULAR

## 2019-09-26 MED ORDER — LIDOCAINE HCL 1 % IJ SOLN
2.0000 mL | INTRAMUSCULAR | Status: AC | PRN
  Administered 2019-09-26: 2 mL
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
## 2019-09-27 DIAGNOSIS — M5136 Other intervertebral disc degeneration, lumbar region: Secondary | ICD-10-CM | POA: Diagnosis not present

## 2019-10-12 ENCOUNTER — Other Ambulatory Visit: Payer: Self-pay | Admitting: Psychiatry

## 2019-10-12 NOTE — Telephone Encounter (Signed)
He has apt tomorrow

## 2019-10-13 ENCOUNTER — Encounter: Payer: Self-pay | Admitting: Psychiatry

## 2019-10-13 ENCOUNTER — Ambulatory Visit: Payer: BC Managed Care – PPO | Admitting: Psychiatry

## 2019-10-13 ENCOUNTER — Other Ambulatory Visit: Payer: Self-pay

## 2019-10-13 ENCOUNTER — Ambulatory Visit (INDEPENDENT_AMBULATORY_CARE_PROVIDER_SITE_OTHER): Payer: BC Managed Care – PPO | Admitting: Mental Health

## 2019-10-13 VITALS — BP 145/95 | HR 69

## 2019-10-13 DIAGNOSIS — F4310 Post-traumatic stress disorder, unspecified: Secondary | ICD-10-CM | POA: Diagnosis not present

## 2019-10-13 DIAGNOSIS — F310 Bipolar disorder, current episode hypomanic: Secondary | ICD-10-CM | POA: Diagnosis not present

## 2019-10-13 DIAGNOSIS — F902 Attention-deficit hyperactivity disorder, combined type: Secondary | ICD-10-CM

## 2019-10-13 MED ORDER — METHYLPHENIDATE HCL 10 MG PO TABS: 10.0000 mg | ORAL_TABLET | Freq: Three times a day (TID) | ORAL | 0 refills | Status: DC

## 2019-10-13 MED ORDER — DIVALPROEX SODIUM 500 MG PO DR TAB: 1500.0000 mg | DELAYED_RELEASE_TABLET | Freq: Every day | ORAL | 1 refills | Status: DC

## 2019-10-13 MED ORDER — METHYLPHENIDATE HCL ER (OSM) 36 MG PO TBCR: 36.0000 mg | EXTENDED_RELEASE_TABLET | Freq: Every day | ORAL | 0 refills | Status: DC

## 2019-10-13 MED ORDER — CLONAZEPAM 0.5 MG PO TABS: 0.5000 mg | ORAL_TABLET | Freq: Three times a day (TID) | ORAL | 2 refills | Status: DC | PRN

## 2019-10-13 NOTE — Progress Notes (Signed)
Psychotherapy Note  Name: RYKKER JASA Date: 10/13/2019 MRN: [id number] DOB: 1976-07-14 PCP: [name], MD  Time spent: 54 minutes  Treatment: Individual therapy  Mental Status Exam: Appearance:   casual  Behavior:  appropriate  Motor:  WNL  Speech/Language:   Clear, coherent  Affect:  Full range  Mood:  anxious  Thought process:  normal  Thought content:   WNL  Sensory/Perceptual disturbances:   WNL  Orientation:  x4  Attention:  Good  Concentration:  Good  Memory:  WNL  Fund of knowledge:   Good  Insight:   Fair  Judgment:   Fair  Impulse Control:  Fair   Reported Symptoms:  Decreased sleep intermittently, some anxiety and irritability, ruminations  Risk Assessment: Danger to Self:  No Self-injurious Behavior: No Danger to Others: No Duty to Warn:no Physical Aggression / Violence:No  Access to Firearms a concern: No  Gang Involvement:No  Patient / guardian was educated about steps to take if suicide or homicide risk level increases between visits: yes While future psychiatric events cannot be accurately predicted, the patient does not currently require acute inpatient psychiatric care and does not currently meet [facility] involuntary commitment criteria.  Medications: Current Outpatient Medications  Medication Sig Dispense Refill  . b complex vitamins tablet Take 1 tablet by mouth daily. 100 tablet 3  . celecoxib (CELEBREX) 200 MG capsule TAKE (1) CAPSULE TWICE DAILY. 180 capsule 2  . Cholecalciferol (VITAMIN D) 2000 units CAPS 1 daily 100 capsule 3  . Cholecalciferol (VITAMIN D3) 1.25 MG (50000 UT) CAPS Take 1 capsule by mouth once a week. 6 capsule 0  . clonazePAM (KLONOPIN) 0.5 MG tablet TAKE 1 TABLET THREE TIMES DAILY AS NEEDED FOR ANXIETY. 60 tablet 0  . divalproex (DEPAKOTE ER) 250 MG 24 hr tablet TAKE ONE TABLET AT BEDTIME. 90 tablet 0  . divalproex (DEPAKOTE) 500 MG DR tablet TAKE 2 TABLETS AT BEDTIME. 180 tablet 0   . esomeprazole (NEXIUM) 40 MG capsule Take 1 capsule (40 mg total) by mouth daily before breakfast. 90 capsule 4  . HUMIRA PEN 40 MG/0.4ML PNKT Inject 40 mg into the muscle every 14 (fourteen) days.   1  . hydrocortisone 2.5 % ointment     . lamoTRIgine (LAMICTAL) 200 MG tablet TAKE 1 TABLET ONCE DAILY. 90 tablet 0  . methylphenidate (CONCERTA) 36 MG PO CR tablet Take 1 tablet (36 mg total) by mouth daily. 30 tablet 0  . methylphenidate (CONCERTA) 36 MG PO CR tablet Take 1 tablet (36 mg total) by mouth daily. 30 tablet 0  . methylphenidate (RITALIN) 10 MG tablet TAKE 1 TABLET 3 TIMES DAILY WITH MEALS. 90 tablet 0  . methylphenidate (RITALIN) 10 MG tablet TAKE 1 TABLET 3 TIMES DAILY WITH MEALS. 90 tablet 0  . methylphenidate (RITALIN) 10 MG tablet TAKE 1 TABLET 3 TIMES DAILY WITH MEALS. 90 tablet 0  . methylphenidate (RITALIN) 10 MG tablet Take 1 tablet (10 mg total) by mouth 3 (three) times daily with meals. 90 tablet 0  . methylphenidate (RITALIN) 10 MG tablet Take 1 tablet (10 mg total) by mouth 3 (three) times daily with meals. 90 tablet 0  . methylphenidate (RITALIN) 10 MG tablet Take 1 tablet (10 mg total) by mouth 3 (three) times daily with meals. 90 tablet 0  . METHYLPHENIDATE 36 MG PO CR tablet TAKE 1 TABLET ONCE DAILY. 30 tablet 0  . methylphenidate 36 MG PO CR tablet Take 1 tablet (36 mg total) by mouth  daily. 30 tablet 0   No current facility-administered medications for this visit.    Allergies  Allergen Reactions  . Nsaids Other (See Comments)    Constipation   Subjective:  Patient arrived on time for session, sharing progress related to the family house renovation. His parents want it done by the first of April, he is doubtful. He shared a recent interaction with his mother where they had an argument. Time was spent for him to discuss the event as well as feelings related. Continues to identify feeling generalized and misunderstood primarily related to the financial aspect of  the renovation of the home which has been a continual source of distress in his relationship with his parents, specifically with his mother. Through discovery, he identified ways he has tried to set some boundaries as well as attempts to communicate his feelings clearly. Assisted him in identifying areas of his life in which she wants to take steps toward for increased independence particularly when done with this project which is in approximately 1 to 2 months.  Interventions: CBT, supportive therapy, problem solving  Diagnoses:    ICD-10-CM   1. Bipolar I disorder, current or most recent episode hypomanic ([place])  F31.0     Plan: Patient is to use CBT, mindfulness and coping skills to help manage decrease symptoms associated with their diagnosis.  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   Long-term goal:   Reduce overall level, frequency, and intensity of the feelings of depression, anxiety evidenced by  decreased irritability, negative self talk, and improved functioning up to 80% of the time as reported by patient for at least 3 consecutive months.    Short-term goal:  Verbally express understanding of the relationship between feelings of depression, anxiety and their impact on thinking patterns and behaviors. Verbalize an understanding of the role that distorted thinking plays in creating fears, excessive worry, and ruminations. 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       Patient to report any concerns with his medications to his prescribing provider as needed.  Progress: progressing   Anson [place], [facility]
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
## 2019-10-27 ENCOUNTER — Other Ambulatory Visit: Payer: Self-pay

## 2019-10-27 ENCOUNTER — Ambulatory Visit (INDEPENDENT_AMBULATORY_CARE_PROVIDER_SITE_OTHER): Payer: BC Managed Care – PPO | Admitting: Mental Health

## 2019-10-27 DIAGNOSIS — F310 Bipolar disorder, current episode hypomanic: Secondary | ICD-10-CM | POA: Diagnosis not present

## 2019-10-27 NOTE — Progress Notes (Signed)
Psychotherapy Note  Name: Francisco Morgan Date: 10/27/2019 MRN: [id number] DOB: [birth year] PCP: [name], MD  Time spent: 54 minutes  Treatment: Individual therapy  Mental Status Exam: Appearance:   casual  Behavior:  appropriate  Motor:  WNL  Speech/Language:   Clear, coherent  Affect:  Full range  Mood:  anxious  Thought process:  normal  Thought content:   WNL  Sensory/Perceptual disturbances:   WNL  Orientation:  x4  Attention:  Good  Concentration:  Good  Memory:  WNL  Fund of knowledge:   Good  Insight:   Fair  Judgment:   Fair  Impulse Control:  Fair   Reported Symptoms:  Decreased sleep intermittently, some anxiety and irritability, ruminations  Risk Assessment: Danger to Self:  No Self-injurious Behavior: No Danger to Others: No Duty to Warn:no Physical Aggression / Violence:No  Access to Firearms a concern: No  Gang Involvement:No  Patient / guardian was educated about steps to take if suicide or homicide risk level increases between visits: yes While future psychiatric events cannot be accurately predicted, the patient does not currently require acute inpatient psychiatric care and does not currently meet [facility] involuntary commitment criteria.  Medications: Current Outpatient Medications  Medication Sig Dispense Refill  . b complex vitamins tablet Take 1 tablet by mouth daily. 100 tablet 3  . celecoxib (CELEBREX) 200 MG capsule TAKE (1) CAPSULE TWICE DAILY. 180 capsule 2  . Cholecalciferol (VITAMIN D) 2000 units CAPS 1 daily 100 capsule 3  . clonazePAM (KLONOPIN) 0.5 MG tablet Take 1 tablet (0.5 mg total) by mouth 3 (three) times daily as needed for anxiety. 60 tablet 2  . divalproex (DEPAKOTE) 500 MG DR tablet Take 3 tablets (1,500 mg total) by mouth at bedtime. 270 tablet 1  . esomeprazole (NEXIUM) 40 MG capsule Take 1 capsule (40 mg total) by mouth daily before breakfast. 90 capsule 4  . HUMIRA PEN 40 MG/0.4ML  PNKT Inject 40 mg into the muscle every 14 (fourteen) days.   1  . hydrocortisone 2.5 % ointment     . lamoTRIgine (LAMICTAL) 200 MG tablet TAKE 1 TABLET ONCE DAILY. 90 tablet 0  . methylphenidate (CONCERTA) 36 MG PO CR tablet Take 1 tablet (36 mg total) by mouth daily. 30 tablet 0  . [START ON 11/10/2019] methylphenidate (CONCERTA) 36 MG PO CR tablet Take 1 tablet (36 mg total) by mouth daily. 30 tablet 0  . methylphenidate (RITALIN) 10 MG tablet Take 1 tablet (10 mg total) by mouth 3 (three) times daily with meals. 90 tablet 0  . methylphenidate (RITALIN) 10 MG tablet Take 1 tablet (10 mg total) by mouth 3 (three) times daily with meals. 90 tablet 0  . methylphenidate (RITALIN) 10 MG tablet Take 1 tablet (10 mg total) by mouth 3 (three) times daily with meals. 90 tablet 0  . methylphenidate (RITALIN) 10 MG tablet Take 1 tablet (10 mg total) by mouth 3 (three) times daily with meals. 90 tablet 0  . [START ON 11/10/2019] methylphenidate (RITALIN) 10 MG tablet Take 1 tablet (10 mg total) by mouth 3 (three) times daily with meals. 90 tablet 0  . [START ON 12/08/2019] methylphenidate (RITALIN) 10 MG tablet Take 1 tablet (10 mg total) by mouth 3 (three) times daily with meals. 90 tablet 0  . methylphenidate 36 MG PO CR tablet Take 1 tablet (36 mg total) by mouth daily. 30 tablet 0  . [START ON 12/08/2019] methylphenidate 36 MG PO CR tablet Take 1  tablet (36 mg total) by mouth daily. 30 tablet 0   No current facility-administered medications for this visit.    Allergies  Allergen Reactions  . Nsaids Other (See Comments)    Constipation   Subjective:  Patient arrived for session, sharing progress how the or getting closer to completing the construction project of the family home to sell it in April.  Continued family relational stress primarily with his mother, going on to share how he feels that he is not understood and at times, not appreciated for some of the knowledge he brings to these projects but  is question about materials he may purchase to complete.  He shared how he has tried to set boundaries in this relationship albeit attention appears to be building per their last discussion.  Some ways to communicate effectively was explored.  Ways to cope, care for himself were explored and reviewed with a focus on getting enough rest, continuing to take his medication as prescribed.    Interventions: CBT, supportive therapy, problem solving  Diagnoses:    ICD-10-CM   1. Bipolar I disorder, current or most recent episode hypomanic ([place])  F31.0     Plan: Patient is to use CBT, mindfulness and coping skills to help manage decrease symptoms associated with their diagnosis.  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   Long-term goal:   Reduce overall level, frequency, and intensity of the feelings of depression, anxiety evidenced by  decreased irritability, negative self talk, and improved functioning up to 80% of the time as reported by patient for at least 3 consecutive months.    Short-term goal:  Verbally express understanding of the relationship between feelings of depression, anxiety and their impact on thinking patterns and behaviors. Verbalize an understanding of the role that distorted thinking plays in creating fears, excessive worry, and ruminations. 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       Patient to report any concerns with his medications to his prescribing provider as needed.  Progress: progressing   Anson [place], [facility]

## 2019-11-07 ENCOUNTER — Ambulatory Visit: Payer: BC Managed Care – PPO | Admitting: Orthopedic Surgery

## 2019-11-10 ENCOUNTER — Other Ambulatory Visit: Payer: Self-pay

## 2019-11-10 ENCOUNTER — Ambulatory Visit (INDEPENDENT_AMBULATORY_CARE_PROVIDER_SITE_OTHER): Payer: BC Managed Care – PPO | Admitting: Mental Health

## 2019-11-10 DIAGNOSIS — F310 Bipolar disorder, current episode hypomanic: Secondary | ICD-10-CM | POA: Diagnosis not present

## 2019-11-10 NOTE — Progress Notes (Signed)
Psychotherapy Note  Name: Francisco Morgan Date: 11/10/2019 MRN: [id number] DOB: 22-Nov-1975 PCP: [name], MD  Time spent: 53 minutes  Treatment: Individual therapy  Mental Status Exam: Appearance:   casual  Behavior:  appropriate  Motor:  WNL  Speech/Language:   Clear, coherent  Affect:  Full range  Mood:  anxious  Thought process:  normal  Thought content:   WNL  Sensory/Perceptual disturbances:   WNL  Orientation:  x4  Attention:  Good  Concentration:  Good  Memory:  WNL  Fund of knowledge:   Good  Insight:   Fair  Judgment:   Fair  Impulse Control:  Fair   Reported Symptoms:  Decreased sleep intermittently, some anxiety and irritability, ruminations  Risk Assessment: Danger to Self:  No Self-injurious Behavior: No Danger to Others: No Duty to Warn:no Physical Aggression / Violence:No  Access to Firearms a concern: No  Gang Involvement:No  Patient / guardian was educated about steps to take if suicide or homicide risk level increases between visits: yes While future psychiatric events cannot be accurately predicted, the patient does not currently require acute inpatient psychiatric care and does not currently meet [facility] involuntary commitment criteria.  Medications: Current Outpatient Medications  Medication Sig Dispense Refill  . b complex vitamins tablet Take 1 tablet by mouth daily. 100 tablet 3  . celecoxib (CELEBREX) 200 MG capsule TAKE (1) CAPSULE TWICE DAILY. 180 capsule 2  . Cholecalciferol (VITAMIN D) 2000 units CAPS 1 daily 100 capsule 3  . clonazePAM (KLONOPIN) 0.5 MG tablet Take 1 tablet (0.5 mg total) by mouth 3 (three) times daily as needed for anxiety. 60 tablet 2  . divalproex (DEPAKOTE) 500 MG DR tablet Take 3 tablets (1,500 mg total) by mouth at bedtime. 270 tablet 1  . esomeprazole (NEXIUM) 40 MG capsule Take 1 capsule (40 mg total) by mouth daily before breakfast. 90 capsule 4  . HUMIRA PEN 40 MG/0.4ML  PNKT Inject 40 mg into the muscle every 14 (fourteen) days.   1  . hydrocortisone 2.5 % ointment     . lamoTRIgine (LAMICTAL) 200 MG tablet TAKE 1 TABLET ONCE DAILY. 90 tablet 0  . methylphenidate (CONCERTA) 36 MG PO CR tablet Take 1 tablet (36 mg total) by mouth daily. 30 tablet 0  . methylphenidate (CONCERTA) 36 MG PO CR tablet Take 1 tablet (36 mg total) by mouth daily. 30 tablet 0  . methylphenidate (RITALIN) 10 MG tablet Take 1 tablet (10 mg total) by mouth 3 (three) times daily with meals. 90 tablet 0  . methylphenidate (RITALIN) 10 MG tablet Take 1 tablet (10 mg total) by mouth 3 (three) times daily with meals. 90 tablet 0  . methylphenidate (RITALIN) 10 MG tablet Take 1 tablet (10 mg total) by mouth 3 (three) times daily with meals. 90 tablet 0  . methylphenidate (RITALIN) 10 MG tablet Take 1 tablet (10 mg total) by mouth 3 (three) times daily with meals. 90 tablet 0  . methylphenidate (RITALIN) 10 MG tablet Take 1 tablet (10 mg total) by mouth 3 (three) times daily with meals. 90 tablet 0  . [START ON 12/08/2019] methylphenidate (RITALIN) 10 MG tablet Take 1 tablet (10 mg total) by mouth 3 (three) times daily with meals. 90 tablet 0  . methylphenidate 36 MG PO CR tablet Take 1 tablet (36 mg total) by mouth daily. 30 tablet 0  . [START ON 12/08/2019] methylphenidate 36 MG PO CR tablet Take 1 tablet (36 mg total) by mouth  daily. 30 tablet 0   No current facility-administered medications for this visit.    Allergies  Allergen Reactions  . Nsaids Other (See Comments)    Constipation   Subjective:  Patient presents for session on time in no distress.  Upon recent discussion with his parents, they have concluded the construction process on the home in terms of their wanting to list the home albeit patient states there is still additional test to be completed but his parents want to go ahead and list properly.  He shared how their relationship has been going recently, no major distress or  arguments per his report.  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  Ways to cope, care for himself were reviewed continued to be explored collaboratively.  He plans to follow through with discussion in this area and agreed to contact this office between sessions if needed.     Interventions: CBT, supportive therapy, problem solving  Diagnoses:    ICD-10-CM   1. Bipolar I disorder, current or most recent episode hypomanic ([place])  F31.0     Plan: Patient is to use CBT, mindfulness and coping skills to help manage decrease symptoms associated with their diagnosis.  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   Long-term goal:   Reduce overall level, frequency, and intensity of the feelings of depression, anxiety evidenced by  decreased irritability, negative self talk, and improved functioning up to 80% of the time as reported by patient for at least 3 consecutive months.    Short-term goal:  Verbally express understanding of the relationship between feelings of depression, anxiety and their impact on thinking patterns and behaviors. Verbalize an understanding of the role that distorted thinking plays in creating fears, excessive worry, and ruminations. 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       Patient to report any concerns with his medications to his prescribing provider as needed.  Progress: progressing   Anson [place], [facility]

## 2019-11-12 ENCOUNTER — Other Ambulatory Visit: Payer: Self-pay | Admitting: Psychiatry

## 2019-11-12 DIAGNOSIS — F310 Bipolar disorder, current episode hypomanic: Secondary | ICD-10-CM

## 2019-11-16 DIAGNOSIS — M1A09X0 Idiopathic chronic gout, multiple sites, without tophus (tophi): Secondary | ICD-10-CM | POA: Diagnosis not present

## 2019-11-16 DIAGNOSIS — Z1589 Genetic susceptibility to other disease: Secondary | ICD-10-CM | POA: Diagnosis not present

## 2019-11-16 DIAGNOSIS — L4059 Other psoriatic arthropathy: Secondary | ICD-10-CM | POA: Diagnosis not present

## 2019-11-16 DIAGNOSIS — L400 Psoriasis vulgaris: Secondary | ICD-10-CM | POA: Diagnosis not present

## 2019-11-17 ENCOUNTER — Encounter: Payer: Self-pay | Admitting: Orthopedic Surgery

## 2019-11-17 ENCOUNTER — Ambulatory Visit: Payer: Self-pay

## 2019-11-17 ENCOUNTER — Other Ambulatory Visit: Payer: Self-pay

## 2019-11-17 ENCOUNTER — Ambulatory Visit: Payer: BC Managed Care – PPO | Admitting: Orthopedic Surgery

## 2019-11-17 VITALS — Ht 68.0 in | Wt 173.0 lb

## 2019-11-17 DIAGNOSIS — M79605 Pain in left leg: Secondary | ICD-10-CM

## 2019-11-17 DIAGNOSIS — M545 Low back pain, unspecified: Secondary | ICD-10-CM

## 2019-11-17 DIAGNOSIS — M25552 Pain in left hip: Secondary | ICD-10-CM

## 2019-11-17 MED ORDER — PREDNISONE 10 MG PO TABS: 20.0000 mg | ORAL_TABLET | Freq: Every day | ORAL | 0 refills | Status: DC

## 2019-11-21 ENCOUNTER — Encounter: Payer: Self-pay | Admitting: Orthopedic Surgery
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
## 2019-11-24 ENCOUNTER — Ambulatory Visit (INDEPENDENT_AMBULATORY_CARE_PROVIDER_SITE_OTHER): Payer: BC Managed Care – PPO | Admitting: Mental Health

## 2019-11-24 ENCOUNTER — Other Ambulatory Visit: Payer: Self-pay

## 2019-11-24 DIAGNOSIS — F310 Bipolar disorder, current episode hypomanic: Secondary | ICD-10-CM | POA: Diagnosis not present

## 2019-11-24 DIAGNOSIS — F902 Attention-deficit hyperactivity disorder, combined type: Secondary | ICD-10-CM

## 2019-11-24 NOTE — Progress Notes (Signed)
Psychotherapy Note  Name: Francisco Morgan Date: 11/24/2019 MRN: [id number] DOB: [birth year] PCP: [name], MD  Time spent: 53 minutes  Treatment: Individual therapy  Mental Status Exam: Appearance:   casual  Behavior:  appropriate  Motor:  WNL  Speech/Language:   Clear, coherent  Affect:  Full range  Mood:  anxious  Thought process:  normal  Thought content:   WNL  Sensory/Perceptual disturbances:   WNL  Orientation:  x4  Attention:  Good  Concentration:  Good  Memory:  WNL  Fund of knowledge:   Good  Insight:   Fair  Judgment:   Fair  Impulse Control:  Fair   Reported Symptoms:  Decreased sleep intermittently, some anxiety and irritability, ruminations  Risk Assessment: Danger to Self:  No Self-injurious Behavior: No Danger to Others: No Duty to Warn:no Physical Aggression / Violence:No  Access to Firearms a concern: No  Gang Involvement:No  Patient / guardian was educated about steps to take if suicide or homicide risk level increases between visits: yes While future psychiatric events cannot be accurately predicted, the patient does not currently require acute inpatient psychiatric care and does not currently meet [facility] involuntary commitment criteria.  Medications: Current Outpatient Medications  Medication Sig Dispense Refill  . b complex vitamins tablet Take 1 tablet by mouth daily. 100 tablet 3  . celecoxib (CELEBREX) 200 MG capsule TAKE (1) CAPSULE TWICE DAILY. 180 capsule 2  . Cholecalciferol (VITAMIN D) 2000 units CAPS 1 daily 100 capsule 3  . clonazePAM (KLONOPIN) 0.5 MG tablet Take 1 tablet (0.5 mg total) by mouth 3 (three) times daily as needed for anxiety. 60 tablet 2  . divalproex (DEPAKOTE) 500 MG DR tablet Take 3 tablets (1,500 mg total) by mouth at bedtime. 270 tablet 1  . esomeprazole (NEXIUM) 40 MG capsule Take 1 capsule (40 mg total) by mouth daily before breakfast. 90 capsule 4  . HUMIRA PEN 40 MG/0.4ML  PNKT Inject 40 mg into the muscle every 14 (fourteen) days.   1  . hydrocortisone 2.5 % ointment     . lamoTRIgine (LAMICTAL) 200 MG tablet TAKE 1 TABLET ONCE DAILY. 90 tablet 0  . methylphenidate (CONCERTA) 36 MG PO CR tablet Take 1 tablet (36 mg total) by mouth daily. 30 tablet 0  . methylphenidate (CONCERTA) 36 MG PO CR tablet Take 1 tablet (36 mg total) by mouth daily. 30 tablet 0  . methylphenidate (RITALIN) 10 MG tablet Take 1 tablet (10 mg total) by mouth 3 (three) times daily with meals. 90 tablet 0  . methylphenidate (RITALIN) 10 MG tablet Take 1 tablet (10 mg total) by mouth 3 (three) times daily with meals. 90 tablet 0  . methylphenidate (RITALIN) 10 MG tablet Take 1 tablet (10 mg total) by mouth 3 (three) times daily with meals. 90 tablet 0  . methylphenidate (RITALIN) 10 MG tablet Take 1 tablet (10 mg total) by mouth 3 (three) times daily with meals. 90 tablet 0  . methylphenidate (RITALIN) 10 MG tablet Take 1 tablet (10 mg total) by mouth 3 (three) times daily with meals. 90 tablet 0  . [START ON 12/08/2019] methylphenidate (RITALIN) 10 MG tablet Take 1 tablet (10 mg total) by mouth 3 (three) times daily with meals. 90 tablet 0  . methylphenidate 36 MG PO CR tablet Take 1 tablet (36 mg total) by mouth daily. 30 tablet 0  . [START ON 12/08/2019] methylphenidate 36 MG PO CR tablet Take 1 tablet (36 mg total) by mouth  daily. 30 tablet 0  . predniSONE (DELTASONE) 10 MG tablet Take 2 tablets (20 mg total) by mouth daily with breakfast. 60 tablet 0   No current facility-administered medications for this visit.    Allergies  Allergen Reactions  . Nsaids Other (See Comments)    Constipation   Subjective:  Patient presents for session in no apparent distress, sharing progress.  He stated that he had a recent discussion with his parents, but they want to go ahead and list the house "as is".  He vented and fully processed many thoughts and feelings related.  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  He shared the challenges over the years related to addressing his mental health, medications that were not as effective but currently he feels his regimen is very helpful.  He continues to also process some specific feelings related to the relationship he has with his mother which remains strained typically.  Assisted him in identifying some short-term goals related to adjusting to focusing on his real estate business primarily while also identifying some areas relating to self-care.  Encouraged consistent sleep regimen as this can fluctuate.    Interventions: CBT, supportive therapy, problem solving  Diagnoses:    ICD-10-CM   1. Bipolar I disorder, current or most recent episode hypomanic ([place])  F31.0   2. Attention deficit hyperactivity disorder (ADHD), combined type  F90.2     Plan: Patient is to use CBT, mindfulness and coping skills to help manage decrease symptoms associated with their diagnosis.    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  Continue to take steps toward effective communication in his relationships.   Long-term goal:   Reduce overall level, frequency, and intensity of the feelings of depression, anxiety evidenced by  decreased irritability, negative self talk, and improved functioning up to 80% of the time as reported by patient for at least 3 consecutive months.    Short-term goal:  Verbally express understanding of the relationship between feelings of depression, anxiety and their impact on thinking patterns and behaviors. Verbalize an understanding of the role that distorted thinking plays in creating fears, excessive worry, and ruminations. 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       Patient to report any concerns with  his medications to his prescribing provider as needed.  Progress: progressing   Anson [place], [facility]

## 2019-11-25 DIAGNOSIS — M21622 Bunionette of left foot: Secondary | ICD-10-CM | POA: Diagnosis not present

## 2019-11-25 DIAGNOSIS — M21621 Bunionette of right foot: Secondary | ICD-10-CM | POA: Diagnosis not present

## 2019-11-25 DIAGNOSIS — M21961 Unspecified acquired deformity of right lower leg: Secondary | ICD-10-CM | POA: Diagnosis not present

## 2019-11-25 DIAGNOSIS — R2689 Other abnormalities of gait and mobility: Secondary | ICD-10-CM | POA: Diagnosis not present

## 2019-11-29 ENCOUNTER — Telehealth: Payer: Self-pay | Admitting: Mental Health

## 2019-12-08 ENCOUNTER — Encounter: Payer: Self-pay | Admitting: Orthopedic Surgery

## 2019-12-08 ENCOUNTER — Ambulatory Visit: Payer: BC Managed Care – PPO | Admitting: Mental Health

## 2019-12-08 ENCOUNTER — Ambulatory Visit: Payer: BC Managed Care – PPO | Admitting: Orthopedic Surgery

## 2019-12-08 ENCOUNTER — Other Ambulatory Visit: Payer: Self-pay

## 2019-12-08 DIAGNOSIS — M25552 Pain in left hip: Secondary | ICD-10-CM | POA: Diagnosis not present

## 2019-12-12 ENCOUNTER — Encounter: Payer: Self-pay | Admitting: Orthopedic Surgery
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
## 2019-12-22 ENCOUNTER — Ambulatory Visit: Payer: BC Managed Care – PPO | Admitting: Mental Health

## 2020-01-04 DIAGNOSIS — M9902 Segmental and somatic dysfunction of thoracic region: Secondary | ICD-10-CM | POA: Diagnosis not present

## 2020-01-04 DIAGNOSIS — M9905 Segmental and somatic dysfunction of pelvic region: Secondary | ICD-10-CM | POA: Diagnosis not present

## 2020-01-04 DIAGNOSIS — M9903 Segmental and somatic dysfunction of lumbar region: Secondary | ICD-10-CM | POA: Diagnosis not present

## 2020-01-04 DIAGNOSIS — M9907 Segmental and somatic dysfunction of upper extremity: Secondary | ICD-10-CM | POA: Diagnosis not present

## 2020-01-05 ENCOUNTER — Other Ambulatory Visit: Payer: Self-pay

## 2020-01-05 ENCOUNTER — Ambulatory Visit (INDEPENDENT_AMBULATORY_CARE_PROVIDER_SITE_OTHER): Payer: BC Managed Care – PPO | Admitting: Mental Health

## 2020-01-05 DIAGNOSIS — F310 Bipolar disorder, current episode hypomanic: Secondary | ICD-10-CM | POA: Diagnosis not present

## 2020-01-05 NOTE — Progress Notes (Signed)
Psychotherapy Note  Name: Francisco Morgan Date: 01/09/2020 MRN: [id number] DOB: 09-10-1975 PCP: [name], MD  Time spent: 53 minutes  Treatment: Individual therapy  Mental Status Exam: Appearance:   casual  Behavior:  appropriate  Motor:  WNL  Speech/Language:   Clear, coherent  Affect:  Full range  Mood:  anxious  Thought process:  normal  Thought content:   WNL  Sensory/Perceptual disturbances:   WNL  Orientation:  x4  Attention:  Good  Concentration:  Good  Memory:  WNL  Fund of knowledge:   Good  Insight:   Fair  Judgment:   Fair  Impulse Control:  Fair   Reported Symptoms:  Decreased sleep intermittently, some anxiety and irritability, ruminations  Risk Assessment: Danger to Self:  No Self-injurious Behavior: No Danger to Others: No Duty to Warn:no Physical Aggression / Violence:No  Access to Firearms a concern: No  Gang Involvement:No  Patient / guardian was educated about steps to take if suicide or homicide risk level increases between visits: yes While future psychiatric events cannot be accurately predicted, the patient does not currently require acute inpatient psychiatric care and does not currently meet [facility] involuntary commitment criteria.  Medications: Current Outpatient Medications  Medication Sig Dispense Refill  . b complex vitamins tablet Take 1 tablet by mouth daily. 100 tablet 3  . celecoxib (CELEBREX) 200 MG capsule TAKE (1) CAPSULE TWICE DAILY. 180 capsule 2  . Cholecalciferol (VITAMIN D) 2000 units CAPS 1 daily 100 capsule 3  . clonazePAM (KLONOPIN) 0.5 MG tablet Take 1 tablet (0.5 mg total) by mouth 3 (three) times daily as needed for anxiety. 60 tablet 2  . divalproex (DEPAKOTE) 500 MG DR tablet Take 3 tablets (1,500 mg total) by mouth at bedtime. 270 tablet 1  . esomeprazole (NEXIUM) 40 MG capsule Take 1 capsule (40 mg total) by mouth daily before breakfast. 90 capsule 4  . HUMIRA PEN 40 MG/0.4ML  PNKT Inject 40 mg into the muscle every 14 (fourteen) days.   1  . hydrocortisone 2.5 % ointment     . lamoTRIgine (LAMICTAL) 200 MG tablet TAKE 1 TABLET ONCE DAILY. 90 tablet 0  . methylphenidate (CONCERTA) 36 MG PO CR tablet Take 1 tablet (36 mg total) by mouth daily. 30 tablet 0  . methylphenidate (CONCERTA) 36 MG PO CR tablet Take 1 tablet (36 mg total) by mouth daily. 30 tablet 0  . methylphenidate (RITALIN) 10 MG tablet Take 1 tablet (10 mg total) by mouth 3 (three) times daily with meals. 90 tablet 0  . methylphenidate (RITALIN) 10 MG tablet Take 1 tablet (10 mg total) by mouth 3 (three) times daily with meals. 90 tablet 0  . methylphenidate (RITALIN) 10 MG tablet Take 1 tablet (10 mg total) by mouth 3 (three) times daily with meals. 90 tablet 0  . methylphenidate (RITALIN) 10 MG tablet Take 1 tablet (10 mg total) by mouth 3 (three) times daily with meals. 90 tablet 0  . methylphenidate (RITALIN) 10 MG tablet Take 1 tablet (10 mg total) by mouth 3 (three) times daily with meals. 90 tablet 0  . methylphenidate (RITALIN) 10 MG tablet Take 1 tablet (10 mg total) by mouth 3 (three) times daily with meals. 90 tablet 0  . methylphenidate 36 MG PO CR tablet Take 1 tablet (36 mg total) by mouth daily. 30 tablet 0  . methylphenidate 36 MG PO CR tablet Take 1 tablet (36 mg total) by mouth daily. 30 tablet 0  .  predniSONE (DELTASONE) 10 MG tablet Take 2 tablets (20 mg total) by mouth daily with breakfast. 60 tablet 0   No current facility-administered medications for this visit.    Allergies  Allergen Reactions  . Nsaids Other (See Comments)    Constipation   Subjective:  Patient presents for session, sharing progress.  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  He continues to live at home with his parents.  These relationships were assessed where he stated that there has been less communication and he feels this is been helpful to keep  the stress in these relationships low.  He stated that they have not listed the house yet and he is still working on it while also keeping up with the demands of his other job.  To continue to obtain more independence is his focus.  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  He identified through guided discovery not feeling like he is heard by his parents, more often by his mother giving some examples.  He identified working to change his expectations as he feels his mother is often self focused.   Interventions: CBT, supportive therapy, problem solving  Diagnoses:    ICD-10-CM   1. Bipolar I disorder, current or most recent episode hypomanic ([place])  F31.0     Plan: Patient is to use CBT, mindfulness and coping skills to help manage decrease symptoms associated with their diagnosis.    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  Continue to take steps toward effective communication in his relationships.   Long-term goal:   Reduce overall level, frequency, and intensity of the feelings of depression, anxiety evidenced by  decreased irritability, negative self talk, and improved functioning up to 80% of the time as reported by patient for at least 3 consecutive months.    Short-term goal:  Verbally express understanding of the relationship between feelings of depression, anxiety and their impact on thinking patterns and behaviors. Verbalize an understanding of the role that distorted thinking plays in creating fears, excessive worry, and ruminations. 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       Patient to report any concerns with his medications to his prescribing  provider as needed.  Progress: progressing   Anson [place], [facility]

## 2020-01-06 DIAGNOSIS — M9905 Segmental and somatic dysfunction of pelvic region: Secondary | ICD-10-CM | POA: Diagnosis not present

## 2020-01-06 DIAGNOSIS — M9903 Segmental and somatic dysfunction of lumbar region: Secondary | ICD-10-CM | POA: Diagnosis not present

## 2020-01-06 DIAGNOSIS — M9907 Segmental and somatic dysfunction of upper extremity: Secondary | ICD-10-CM | POA: Diagnosis not present

## 2020-01-06 DIAGNOSIS — M9902 Segmental and somatic dysfunction of thoracic region: Secondary | ICD-10-CM | POA: Diagnosis not present

## 2020-01-09 DIAGNOSIS — M9903 Segmental and somatic dysfunction of lumbar region: Secondary | ICD-10-CM | POA: Diagnosis not present

## 2020-01-09 DIAGNOSIS — M9905 Segmental and somatic dysfunction of pelvic region: Secondary | ICD-10-CM | POA: Diagnosis not present

## 2020-01-09 DIAGNOSIS — M9907 Segmental and somatic dysfunction of upper extremity: Secondary | ICD-10-CM | POA: Diagnosis not present

## 2020-01-09 DIAGNOSIS — M9902 Segmental and somatic dysfunction of thoracic region: Secondary | ICD-10-CM | POA: Diagnosis not present

## 2020-01-12 DIAGNOSIS — M9905 Segmental and somatic dysfunction of pelvic region: Secondary | ICD-10-CM | POA: Diagnosis not present

## 2020-01-12 DIAGNOSIS — M9902 Segmental and somatic dysfunction of thoracic region: Secondary | ICD-10-CM | POA: Diagnosis not present

## 2020-01-12 DIAGNOSIS — M9907 Segmental and somatic dysfunction of upper extremity: Secondary | ICD-10-CM | POA: Diagnosis not present

## 2020-01-12 DIAGNOSIS — M9903 Segmental and somatic dysfunction of lumbar region: Secondary | ICD-10-CM | POA: Diagnosis not present

## 2020-01-19 ENCOUNTER — Other Ambulatory Visit: Payer: Self-pay

## 2020-01-19 ENCOUNTER — Ambulatory Visit (INDEPENDENT_AMBULATORY_CARE_PROVIDER_SITE_OTHER): Payer: BC Managed Care – PPO | Admitting: Mental Health

## 2020-01-19 DIAGNOSIS — F310 Bipolar disorder, current episode hypomanic: Secondary | ICD-10-CM | POA: Diagnosis not present

## 2020-01-19 NOTE — Progress Notes (Signed)
Psychotherapy Note  Name: Francisco Morgan Date: 01/19/2020 MRN: [id number] DOB: Dec 01, 1975 PCP: [name], MD  Time spent: 53 minutes  Treatment: Individual therapy  Mental Status Exam: Appearance:   casual  Behavior:  appropriate  Motor:  WNL  Speech/Language:   Clear, coherent  Affect:  Full range  Mood:  anxious  Thought process:  normal  Thought content:   WNL  Sensory/Perceptual disturbances:   WNL  Orientation:  x4  Attention:  Good  Concentration:  Good  Memory:  WNL  Fund of knowledge:   Good  Insight:   Fair  Judgment:   Fair  Impulse Control:  Fair   Reported Symptoms:  Decreased sleep intermittently, some anxiety and irritability, ruminations  Risk Assessment: Danger to Self:  No Self-injurious Behavior: No Danger to Others: No Duty to Warn:no Physical Aggression / Violence:No  Access to Firearms a concern: No  Gang Involvement:No  Patient / guardian was educated about steps to take if suicide or homicide risk level increases between visits: yes While future psychiatric events cannot be accurately predicted, the patient does not currently require acute inpatient psychiatric care and does not currently meet [facility] involuntary commitment criteria.  Medications: Current Outpatient Medications  Medication Sig Dispense Refill  . b complex vitamins tablet Take 1 tablet by mouth daily. 100 tablet 3  . celecoxib (CELEBREX) 200 MG capsule TAKE (1) CAPSULE TWICE DAILY. 180 capsule 2  . Cholecalciferol (VITAMIN D) 2000 units CAPS 1 daily 100 capsule 3  . clonazePAM (KLONOPIN) 0.5 MG tablet Take 1 tablet (0.5 mg total) by mouth 3 (three) times daily as needed for anxiety. 60 tablet 2  . divalproex (DEPAKOTE) 500 MG DR tablet Take 3 tablets (1,500 mg total) by mouth at bedtime. 270 tablet 1  . esomeprazole (NEXIUM) 40 MG capsule Take 1 capsule (40 mg total) by mouth daily before breakfast. 90 capsule 4  . HUMIRA PEN 40 MG/0.4ML  PNKT Inject 40 mg into the muscle every 14 (fourteen) days.   1  . hydrocortisone 2.5 % ointment     . lamoTRIgine (LAMICTAL) 200 MG tablet TAKE 1 TABLET ONCE DAILY. 90 tablet 0  . methylphenidate (CONCERTA) 36 MG PO CR tablet Take 1 tablet (36 mg total) by mouth daily. 30 tablet 0  . methylphenidate (CONCERTA) 36 MG PO CR tablet Take 1 tablet (36 mg total) by mouth daily. 30 tablet 0  . methylphenidate (RITALIN) 10 MG tablet Take 1 tablet (10 mg total) by mouth 3 (three) times daily with meals. 90 tablet 0  . methylphenidate (RITALIN) 10 MG tablet Take 1 tablet (10 mg total) by mouth 3 (three) times daily with meals. 90 tablet 0  . methylphenidate (RITALIN) 10 MG tablet Take 1 tablet (10 mg total) by mouth 3 (three) times daily with meals. 90 tablet 0  . methylphenidate (RITALIN) 10 MG tablet Take 1 tablet (10 mg total) by mouth 3 (three) times daily with meals. 90 tablet 0  . methylphenidate (RITALIN) 10 MG tablet Take 1 tablet (10 mg total) by mouth 3 (three) times daily with meals. 90 tablet 0  . methylphenidate (RITALIN) 10 MG tablet Take 1 tablet (10 mg total) by mouth 3 (three) times daily with meals. 90 tablet 0  . methylphenidate 36 MG PO CR tablet Take 1 tablet (36 mg total) by mouth daily. 30 tablet 0  . methylphenidate 36 MG PO CR tablet Take 1 tablet (36 mg total) by mouth daily. 30 tablet 0  .  predniSONE (DELTASONE) 10 MG tablet Take 2 tablets (20 mg total) by mouth daily with breakfast. 60 tablet 0   No current facility-administered medications for this visit.    Allergies  Allergen Reactions  . Nsaids Other (See Comments)    Constipation   Subjective:  Patient presents for session on time.  Discusses his continued work on his parents investment home.  Continues to verbalize how he maintains a sense of boundaries with the relationship with his parents, feels that his relationship with his mother will remain somewhat strained sharing some past relational issues.  He stated that  she is to have a biopsy today, that she may have lymphoma.  He is hopeful tests are negative, he knows his parents rely heavily on one another as they have been married for many years.  Shared experiences recently with extended family members that have visited, how his father took a trip with his second cousin.  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  He stated that has not been listed as his parents planned about a month ago appearing that they have had somewhat of a compromise.  Ways to cope and care for himself were explored.  Emphasized patient getting enough rest consistently, keeping doctor appointments.   Interventions: CBT, supportive therapy, problem solving  Diagnoses:    ICD-10-CM   1. Bipolar I disorder, current or most recent episode hypomanic ([place])  F31.0     Plan: Patient is to use CBT, mindfulness and coping skills to help manage decrease symptoms associated with their diagnosis.    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  Continue to take steps toward effective communication in his relationships.   Long-term goal:   Reduce overall level, frequency, and intensity of the feelings of depression, anxiety evidenced by  decreased irritability, negative self talk, and improved functioning up to 80% of the time as reported by patient for at least 3 consecutive months.    Short-term goal:  Verbally express understanding of the relationship between feelings of depression, anxiety and their impact on thinking patterns and behaviors. Verbalize an understanding of the role that distorted thinking plays in creating fears, excessive worry, and ruminations. 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       Patient to report any concerns with his medications to his  prescribing provider as needed.  Progress: progressing   Anson [place], [facility]

## 2020-01-23 DIAGNOSIS — M9907 Segmental and somatic dysfunction of upper extremity: Secondary | ICD-10-CM | POA: Diagnosis not present

## 2020-01-23 DIAGNOSIS — M9905 Segmental and somatic dysfunction of pelvic region: Secondary | ICD-10-CM | POA: Diagnosis not present

## 2020-01-23 DIAGNOSIS — M9903 Segmental and somatic dysfunction of lumbar region: Secondary | ICD-10-CM | POA: Diagnosis not present

## 2020-01-23 DIAGNOSIS — M9902 Segmental and somatic dysfunction of thoracic region: Secondary | ICD-10-CM | POA: Diagnosis not present

## 2020-02-01 ENCOUNTER — Other Ambulatory Visit: Payer: Self-pay

## 2020-02-01 ENCOUNTER — Ambulatory Visit (INDEPENDENT_AMBULATORY_CARE_PROVIDER_SITE_OTHER): Payer: BC Managed Care – PPO | Admitting: Mental Health

## 2020-02-01 DIAGNOSIS — F310 Bipolar disorder, current episode hypomanic: Secondary | ICD-10-CM

## 2020-02-01 DIAGNOSIS — M9903 Segmental and somatic dysfunction of lumbar region: Secondary | ICD-10-CM | POA: Diagnosis not present

## 2020-02-01 DIAGNOSIS — M9902 Segmental and somatic dysfunction of thoracic region: Secondary | ICD-10-CM | POA: Diagnosis not present

## 2020-02-01 DIAGNOSIS — M9905 Segmental and somatic dysfunction of pelvic region: Secondary | ICD-10-CM | POA: Diagnosis not present

## 2020-02-01 DIAGNOSIS — M9907 Segmental and somatic dysfunction of upper extremity: Secondary | ICD-10-CM | POA: Diagnosis not present

## 2020-02-01 NOTE — Progress Notes (Signed)
Psychotherapy Note  Name: Francisco Morgan Date: 02/01/20 MRN: [id number] DOB: 20-Jun-1976 PCP: [name], MD  Time spent: 53 minutes  Treatment: Individual therapy  Mental Status Exam: Appearance:   casual  Behavior:  appropriate  Motor:  WNL  Speech/Language:   Clear, coherent  Affect:  Full range  Mood:  anxious  Thought process:  normal  Thought content:   WNL  Sensory/Perceptual disturbances:   WNL  Orientation:  x4  Attention:  Good  Concentration:  Good  Memory:  WNL  Fund of knowledge:   Good  Insight:   Fair  Judgment:   Fair  Impulse Control:  Fair   Reported Symptoms:  Decreased sleep intermittently, some anxiety and irritability, ruminations  Risk Assessment: Danger to Self:  No Self-injurious Behavior: No Danger to Others: No Duty to Warn:no Physical Aggression / Violence:No  Access to Firearms a concern: No  Gang Involvement:No  Patient / guardian was educated about steps to take if suicide or homicide risk level increases between visits: yes While future psychiatric events cannot be accurately predicted, the patient does not currently require acute inpatient psychiatric care and does not currently meet [facility] involuntary commitment criteria.  Medications: Current Outpatient Medications  Medication Sig Dispense Refill  . b complex vitamins tablet Take 1 tablet by mouth daily. 100 tablet 3  . celecoxib (CELEBREX) 200 MG capsule TAKE (1) CAPSULE TWICE DAILY. 180 capsule 2  . Cholecalciferol (VITAMIN D) 2000 units CAPS 1 daily 100 capsule 3  . clonazePAM (KLONOPIN) 0.5 MG tablet Take 1 tablet (0.5 mg total) by mouth 3 (three) times daily as needed for anxiety. 60 tablet 2  . divalproex (DEPAKOTE) 500 MG DR tablet Take 3 tablets (1,500 mg total) by mouth at bedtime. 270 tablet 1  . esomeprazole (NEXIUM) 40 MG capsule Take 1 capsule (40 mg total) by mouth daily before breakfast. 90 capsule 4  . HUMIRA PEN 40 MG/0.4ML  PNKT Inject 40 mg into the muscle every 14 (fourteen) days.   1  . hydrocortisone 2.5 % ointment     . lamoTRIgine (LAMICTAL) 200 MG tablet TAKE 1 TABLET ONCE DAILY. 90 tablet 0  . methylphenidate (CONCERTA) 36 MG PO CR tablet Take 1 tablet (36 mg total) by mouth daily. 30 tablet 0  . methylphenidate (CONCERTA) 36 MG PO CR tablet Take 1 tablet (36 mg total) by mouth daily. 30 tablet 0  . methylphenidate (RITALIN) 10 MG tablet Take 1 tablet (10 mg total) by mouth 3 (three) times daily with meals. 90 tablet 0  . methylphenidate (RITALIN) 10 MG tablet Take 1 tablet (10 mg total) by mouth 3 (three) times daily with meals. 90 tablet 0  . methylphenidate (RITALIN) 10 MG tablet Take 1 tablet (10 mg total) by mouth 3 (three) times daily with meals. 90 tablet 0  . methylphenidate (RITALIN) 10 MG tablet Take 1 tablet (10 mg total) by mouth 3 (three) times daily with meals. 90 tablet 0  . methylphenidate (RITALIN) 10 MG tablet Take 1 tablet (10 mg total) by mouth 3 (three) times daily with meals. 90 tablet 0  . methylphenidate (RITALIN) 10 MG tablet Take 1 tablet (10 mg total) by mouth 3 (three) times daily with meals. 90 tablet 0  . methylphenidate 36 MG PO CR tablet Take 1 tablet (36 mg total) by mouth daily. 30 tablet 0  . methylphenidate 36 MG PO CR tablet Take 1 tablet (36 mg total) by mouth daily. 30 tablet 0  .  predniSONE (DELTASONE) 10 MG tablet Take 2 tablets (20 mg total) by mouth daily with breakfast. 60 tablet 0   No current facility-administered medications for this visit.    Allergies  Allergen Reactions  . Nsaids Other (See Comments)    Constipation   Subjective:  Patient shared progress. You said that he and his parents have had some continued disagreements related to progress on remodeling one of their properties. Through guided discovery, he identified feelings of frustration. Feels like he has put his life on hold the past 5 years to work on this property only to be questioned often  related to progress. Some of the feelings identified related to not being provided any words of affirmation or acknowledgment of his efforts, more often critical comments. He stated that over the last few days and it's gotten better regarding their relationship, not having as many disagreements. Expresses some frustration continuing to live and his parents home in a relatively small space over the last couple of years. 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   Interventions: CBT, supportive therapy, problem solving  Diagnoses:    ICD-10-CM   1. Bipolar I disorder, current or most recent episode hypomanic ([place])  F31.0     Plan: Patient is to use CBT, mindfulness and coping skills to help manage decrease symptoms associated with their diagnosis.    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  Continue to take steps toward effective communication in his relationships.   Long-term goal:   Reduce overall level, frequency, and intensity of the feelings of depression, anxiety evidenced by  decreased irritability, negative self talk, and improved functioning up to 80% of the time as reported by patient for at least 3 consecutive months.    Short-term goal:  Verbally express understanding of the relationship between feelings of depression, anxiety and their impact on thinking patterns and behaviors. Verbalize an understanding of the role that distorted thinking plays in creating fears, excessive worry, and ruminations. 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       Patient to report any concerns with his medications to his prescribing  provider as needed.  Progress: progressing   Anson [place], [facility]

## 2020-02-08 ENCOUNTER — Other Ambulatory Visit: Payer: Self-pay | Admitting: Psychiatry

## 2020-02-08 DIAGNOSIS — F4310 Post-traumatic stress disorder, unspecified: Secondary | ICD-10-CM

## 2020-02-09 DIAGNOSIS — M9902 Segmental and somatic dysfunction of thoracic region: Secondary | ICD-10-CM | POA: Diagnosis not present

## 2020-02-09 DIAGNOSIS — M9907 Segmental and somatic dysfunction of upper extremity: Secondary | ICD-10-CM | POA: Diagnosis not present

## 2020-02-09 DIAGNOSIS — M9905 Segmental and somatic dysfunction of pelvic region: Secondary | ICD-10-CM | POA: Diagnosis not present

## 2020-02-09 DIAGNOSIS — M9903 Segmental and somatic dysfunction of lumbar region: Secondary | ICD-10-CM | POA: Diagnosis not present

## 2020-02-09 NOTE — Telephone Encounter (Signed)
Stimulants both refilled on 01/09/2020 Clonazepam was 12/09/2019 Has apt with [facility] on 07/20

## 2020-02-13 ENCOUNTER — Encounter: Payer: Self-pay | Admitting: Orthopedic Surgery

## 2020-02-13 ENCOUNTER — Ambulatory Visit: Payer: Self-pay

## 2020-02-13 ENCOUNTER — Ambulatory Visit: Payer: BC Managed Care – PPO | Admitting: Orthopedic Surgery

## 2020-02-13 VITALS — Ht 68.0 in | Wt 173.0 lb

## 2020-02-13 DIAGNOSIS — M25562 Pain in left knee: Secondary | ICD-10-CM

## 2020-02-13 DIAGNOSIS — L03116 Cellulitis of left lower limb: Secondary | ICD-10-CM

## 2020-02-13 MED ORDER — DOXYCYCLINE HYCLATE 100 MG PO TABS: 100.0000 mg | ORAL_TABLET | Freq: Two times a day (BID) | ORAL | 0 refills | Status: DC
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
## 2020-02-14 ENCOUNTER — Encounter: Payer: Self-pay | Admitting: Psychiatry

## 2020-02-14 ENCOUNTER — Ambulatory Visit (INDEPENDENT_AMBULATORY_CARE_PROVIDER_SITE_OTHER): Payer: BC Managed Care – PPO | Admitting: Psychiatry

## 2020-02-14 ENCOUNTER — Other Ambulatory Visit: Payer: Self-pay

## 2020-02-14 DIAGNOSIS — F310 Bipolar disorder, current episode hypomanic: Secondary | ICD-10-CM

## 2020-02-14 DIAGNOSIS — F319 Bipolar disorder, unspecified: Secondary | ICD-10-CM | POA: Diagnosis not present

## 2020-02-14 DIAGNOSIS — F4310 Post-traumatic stress disorder, unspecified: Secondary | ICD-10-CM

## 2020-02-14 DIAGNOSIS — F902 Attention-deficit hyperactivity disorder, combined type: Secondary | ICD-10-CM | POA: Diagnosis not present

## 2020-02-14 MED ORDER — DIVALPROEX SODIUM 500 MG PO DR TAB: 1500.0000 mg | DELAYED_RELEASE_TABLET | Freq: Every day | ORAL | 1 refills | Status: DC

## 2020-02-14 MED ORDER — CLONAZEPAM 0.5 MG PO TABS: 0.5000 mg | ORAL_TABLET | Freq: Three times a day (TID) | ORAL | 5 refills | Status: DC | PRN

## 2020-02-14 MED ORDER — METHYLPHENIDATE HCL ER (OSM) 36 MG PO TBCR: 36.0000 mg | EXTENDED_RELEASE_TABLET | Freq: Every day | ORAL | 0 refills | Status: DC

## 2020-02-14 MED ORDER — METHYLPHENIDATE HCL 10 MG PO TABS: 10.0000 mg | ORAL_TABLET | Freq: Three times a day (TID) | ORAL | 0 refills | Status: DC

## 2020-02-14 MED ORDER — ARIPIPRAZOLE 15 MG PO TABS: 15.0000 mg | ORAL_TABLET | Freq: Every day | ORAL | 1 refills | Status: DC

## 2020-02-14 MED ORDER — LAMOTRIGINE 200 MG PO TABS: 200.0000 mg | ORAL_TABLET | Freq: Every day | ORAL | 1 refills | Status: DC
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
## 2020-02-15 ENCOUNTER — Ambulatory Visit: Payer: BC Managed Care – PPO | Admitting: Family

## 2020-02-16 ENCOUNTER — Encounter: Payer: Self-pay | Admitting: Physician Assistant

## 2020-02-16 ENCOUNTER — Ambulatory Visit (INDEPENDENT_AMBULATORY_CARE_PROVIDER_SITE_OTHER): Payer: BC Managed Care – PPO | Admitting: Mental Health

## 2020-02-16 ENCOUNTER — Other Ambulatory Visit: Payer: Self-pay

## 2020-02-16 ENCOUNTER — Ambulatory Visit: Payer: BC Managed Care – PPO | Admitting: Physician Assistant

## 2020-02-16 ENCOUNTER — Ambulatory Visit: Payer: BC Managed Care – PPO | Admitting: Orthopedic Surgery

## 2020-02-16 VITALS — Ht 68.0 in | Wt 173.0 lb

## 2020-02-16 DIAGNOSIS — M25562 Pain in left knee: Secondary | ICD-10-CM | POA: Diagnosis not present

## 2020-02-16 DIAGNOSIS — F319 Bipolar disorder, unspecified: Secondary | ICD-10-CM | POA: Diagnosis not present
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
## 2020-02-16 NOTE — Progress Notes (Signed)
Psychotherapy Note  Name: Francisco Morgan Date: 02/16/20 MRN: [id number] DOB: Jun 30, 1976 PCP: [name], MD  Time spent: 54 minutes  Treatment: Individual therapy  Mental Status Exam: Appearance:   casual  Behavior:  appropriate  Motor:  WNL  Speech/Language:   Clear, coherent  Affect:  Full range  Mood:  anxious  Thought process:  normal  Thought content:   WNL  Sensory/Perceptual disturbances:   WNL  Orientation:  x4  Attention:  Good  Concentration:  Good  Memory:  WNL  Fund of knowledge:   Good  Insight:   Fair  Judgment:   Fair  Impulse Control:  Fair   Reported Symptoms:  Decreased sleep intermittently, some anxiety and irritability, ruminations  Risk Assessment: Danger to Self:  No Self-injurious Behavior: No Danger to Others: No Duty to Warn:no Physical Aggression / Violence:No  Access to Firearms a concern: No  Gang Involvement:No  Patient / guardian was educated about steps to take if suicide or homicide risk level increases between visits: yes While future psychiatric events cannot be accurately predicted, the patient does not currently require acute inpatient psychiatric care and does not currently meet [facility] involuntary commitment criteria.  Medications: Current Outpatient Medications  Medication Sig Dispense Refill   ARIPiprazole (ABILIFY) 15 MG tablet Take 1 tablet (15 mg total) by mouth daily. 30 tablet 1   b complex vitamins tablet Take 1 tablet by mouth daily. 100 tablet 3   celecoxib (CELEBREX) 200 MG capsule TAKE (1) CAPSULE TWICE DAILY. 180 capsule 2   Cholecalciferol (VITAMIN D) 2000 units CAPS 1 daily 100 capsule 3   clonazePAM (KLONOPIN) 0.5 MG tablet Take 1 tablet (0.5 mg total) by mouth 3 (three) times daily as needed for anxiety. 60 tablet 5   divalproex (DEPAKOTE) 500 MG DR tablet Take 3 tablets (1,500 mg total) by mouth at bedtime. 270 tablet 1   doxycycline (VIBRA-TABS) 100 MG tablet Take  1 tablet (100 mg total) by mouth 2 (two) times daily. 60 tablet 0   esomeprazole (NEXIUM) 40 MG capsule Take 1 capsule (40 mg total) by mouth daily before breakfast. 90 capsule 4   HUMIRA PEN 40 MG/0.4ML PNKT Inject 40 mg into the muscle every 14 (fourteen) days.   1   hydrocortisone 2.5 % ointment      lamoTRIgine (LAMICTAL) 200 MG tablet Take 1 tablet (200 mg total) by mouth daily. 90 tablet 1   methylphenidate (CONCERTA) 36 MG PO CR tablet Take 1 tablet (36 mg total) by mouth daily. 30 tablet 0   methylphenidate (CONCERTA) 36 MG PO CR tablet Take 1 tablet (36 mg total) by mouth daily. 30 tablet 0   methylphenidate (RITALIN) 10 MG tablet Take 1 tablet (10 mg total) by mouth 3 (three) times daily with meals. 90 tablet 0   methylphenidate (RITALIN) 10 MG tablet Take 1 tablet (10 mg total) by mouth 3 (three) times daily with meals. 90 tablet 0   methylphenidate (RITALIN) 10 MG tablet Take 1 tablet (10 mg total) by mouth 3 (three) times daily with meals. 90 tablet 0   methylphenidate (RITALIN) 10 MG tablet Take 1 tablet (10 mg total) by mouth 3 (three) times daily with meals. 90 tablet 0   methylphenidate (RITALIN) 10 MG tablet Take 1 tablet (10 mg total) by mouth 3 (three) times daily with meals. 90 tablet 0   [START ON 03/13/2020] methylphenidate (RITALIN) 10 MG tablet Take 1 tablet (10 mg total) by mouth 3 (three) times  daily with meals. 90 tablet 0   [START ON 04/10/2020] methylphenidate (RITALIN) 10 MG tablet Take 1 tablet (10 mg total) by mouth 3 (three) times daily with meals. 90 tablet 0   methylphenidate 36 MG PO CR tablet Take 1 tablet (36 mg total) by mouth daily. 30 tablet 0   [START ON 03/13/2020] methylphenidate 36 MG PO CR tablet Take 1 tablet (36 mg total) by mouth daily. 30 tablet 0   [START ON 04/10/2020] methylphenidate 36 MG PO CR tablet Take 1 tablet (36 mg total) by mouth daily. 30 tablet 0   predniSONE (DELTASONE) 10 MG tablet Take 2 tablets (20 mg total) by mouth  daily with breakfast. 60 tablet 0   No current facility-administered medications for this visit.    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  Reports that he may work consecutively for several hours sometimes overnight, feels he can get more done when he has enough energy and focus comes easier.  We encourage getting enough rest, explored sleeping patterns.  Some recent relational strain with his parents, they want to property remodeling completed mother specifically told him he has 2 weeks and she plans to hire additional help.  He rather this not occur, feels he is more than capable of completing the job and is very particular about the quality of the work with which he is performing.  He does not want this compromised.  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  Stated that it was a collaborative decision, that his parents sought his help, only to feel criticized periodically regarding material costs and time of completion.  Assisted him in refocusing on what he can control, where he identified working on the project, allowing some components of the work to be done quickly, while informing his parents of their options in terms of his being expeditious versus the outcome of higher quality work.   Interventions: CBT, supportive therapy, problem solving  Diagnoses:    ICD-10-CM   1. Bipolar affective disorder, rapid cycling ([place])  F31.9     Plan: Patient is to use CBT, mindfulness and coping skills to help manage decrease symptoms associated with their diagnosis.    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  Continue to take steps toward effective  communication in his relationships.   Long-term goal:   Reduce overall level, frequency, and intensity of the feelings of depression, anxiety evidenced by  decreased irritability, negative self talk, and improved functioning up to 80% of the time as reported by patient for at least 3 consecutive months.    Short-term goal:  Verbally express understanding of the relationship between feelings of depression, anxiety and their impact on thinking patterns and behaviors. Verbalize an understanding of the role that distorted thinking plays in creating fears, excessive worry, and ruminations. 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       Patient to report any concerns with his medications to his prescribing provider as needed.  Progress: progressing   Anson [place], [facility]

## 2020-02-20 ENCOUNTER — Ambulatory Visit: Payer: BC Managed Care – PPO | Admitting: Orthopedic Surgery

## 2020-02-21 LAB — BODY FLUID CULTURE

## 2020-02-22 ENCOUNTER — Other Ambulatory Visit: Payer: Self-pay | Admitting: Physician Assistant

## 2020-02-22 MED ORDER — SULFAMETHOXAZOLE-TRIMETHOPRIM 800-160 MG PO TABS: 1.0000 | ORAL_TABLET | Freq: Two times a day (BID) | ORAL | 0 refills | Status: DC

## 2020-02-22 NOTE — Progress Notes (Signed)
Cultures reviewed . Per Dr. Sharol Given Bactrim called in.

## 2020-02-23 DIAGNOSIS — M9905 Segmental and somatic dysfunction of pelvic region: Secondary | ICD-10-CM | POA: Diagnosis not present

## 2020-02-23 DIAGNOSIS — M9907 Segmental and somatic dysfunction of upper extremity: Secondary | ICD-10-CM | POA: Diagnosis not present

## 2020-02-23 DIAGNOSIS — M9903 Segmental and somatic dysfunction of lumbar region: Secondary | ICD-10-CM | POA: Diagnosis not present

## 2020-02-23 DIAGNOSIS — M9902 Segmental and somatic dysfunction of thoracic region: Secondary | ICD-10-CM | POA: Diagnosis not present

## 2020-02-25 ENCOUNTER — Encounter: Payer: Self-pay | Admitting: Psychiatry

## 2020-02-27 ENCOUNTER — Other Ambulatory Visit: Payer: Self-pay

## 2020-02-27 ENCOUNTER — Ambulatory Visit: Payer: BC Managed Care – PPO | Admitting: Orthopedic Surgery

## 2020-02-29 ENCOUNTER — Ambulatory Visit: Payer: BC Managed Care – PPO | Admitting: Physician Assistant

## 2020-02-29 ENCOUNTER — Encounter: Payer: Self-pay | Admitting: Physician Assistant

## 2020-02-29 VITALS — Ht 68.0 in | Wt 173.0 lb

## 2020-02-29 DIAGNOSIS — L03116 Cellulitis of left lower limb: Secondary | ICD-10-CM | POA: Diagnosis not present
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
## 2020-03-01 ENCOUNTER — Ambulatory Visit: Payer: BC Managed Care – PPO | Admitting: Mental Health

## 2020-03-14 ENCOUNTER — Encounter: Payer: Self-pay | Admitting: Physician Assistant

## 2020-03-14 ENCOUNTER — Ambulatory Visit: Payer: BC Managed Care – PPO | Admitting: Physician Assistant

## 2020-03-14 DIAGNOSIS — L03116 Cellulitis of left lower limb: Secondary | ICD-10-CM
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
## 2020-03-15 ENCOUNTER — Ambulatory Visit: Payer: BC Managed Care – PPO | Admitting: Mental Health

## 2020-03-22 ENCOUNTER — Other Ambulatory Visit: Payer: Self-pay

## 2020-03-22 ENCOUNTER — Ambulatory Visit (INDEPENDENT_AMBULATORY_CARE_PROVIDER_SITE_OTHER): Payer: BC Managed Care – PPO | Admitting: Mental Health

## 2020-03-22 DIAGNOSIS — F319 Bipolar disorder, unspecified: Secondary | ICD-10-CM

## 2020-03-22 NOTE — Progress Notes (Signed)
Psychotherapy Note  Name: Francisco Morgan Date: 03/22/20 MRN: [id number] DOB: October 03, 1975 PCP: [name], MD  Time spent: 53 minutes  Treatment: Individual therapy  Mental Status Exam: Appearance:   casual  Behavior:  appropriate  Motor:  WNL  Speech/Language:   Clear, coherent  Affect:  Full range  Mood:  anxious  Thought process:  normal  Thought content:   WNL  Sensory/Perceptual disturbances:   WNL  Orientation:  x4  Attention:  Good  Concentration:  Good  Memory:  WNL  Fund of knowledge:   Good  Insight:   Fair  Judgment:   developing  Impulse Control:  developing   Reported Symptoms:  Decreased sleep intermittently, some anxiety and irritability, ruminations  Risk Assessment: Danger to Self:  No Self-injurious Behavior: No Danger to Others: No Duty to Warn:no Physical Aggression / Violence:No  Access to Firearms a concern: No  Gang Involvement:No  Patient / guardian was educated about steps to take if suicide or homicide risk level increases between visits: yes While future psychiatric events cannot be accurately predicted, the patient does not currently require acute inpatient psychiatric care and does not currently meet [facility] involuntary commitment criteria.  Medications: Current Outpatient Medications  Medication Sig Dispense Refill  . ARIPiprazole (ABILIFY) 15 MG tablet Take 1 tablet (15 mg total) by mouth daily. 30 tablet 1  . b complex vitamins tablet Take 1 tablet by mouth daily. 100 tablet 3  . celecoxib (CELEBREX) 200 MG capsule TAKE (1) CAPSULE TWICE DAILY. 180 capsule 2  . Cholecalciferol (VITAMIN D) 2000 units CAPS 1 daily 100 capsule 3  . clonazePAM (KLONOPIN) 0.5 MG tablet Take 1 tablet (0.5 mg total) by mouth 3 (three) times daily as needed for anxiety. 60 tablet 5  . divalproex (DEPAKOTE) 500 MG DR tablet Take 3 tablets (1,500 mg total) by mouth at bedtime. 270 tablet 1  . doxycycline (VIBRA-TABS) 100 MG  tablet Take 1 tablet (100 mg total) by mouth 2 (two) times daily. 60 tablet 0  . esomeprazole (NEXIUM) 40 MG capsule Take 1 capsule (40 mg total) by mouth daily before breakfast. 90 capsule 4  . HUMIRA PEN 40 MG/0.4ML PNKT Inject 40 mg into the muscle every 14 (fourteen) days.   1  . hydrocortisone 2.5 % ointment     . lamoTRIgine (LAMICTAL) 200 MG tablet Take 1 tablet (200 mg total) by mouth daily. 90 tablet 1  . methylphenidate (CONCERTA) 36 MG PO CR tablet Take 1 tablet (36 mg total) by mouth daily. 30 tablet 0  . methylphenidate (CONCERTA) 36 MG PO CR tablet Take 1 tablet (36 mg total) by mouth daily. 30 tablet 0  . methylphenidate (RITALIN) 10 MG tablet Take 1 tablet (10 mg total) by mouth 3 (three) times daily with meals. 90 tablet 0  . methylphenidate (RITALIN) 10 MG tablet Take 1 tablet (10 mg total) by mouth 3 (three) times daily with meals. 90 tablet 0  . methylphenidate (RITALIN) 10 MG tablet Take 1 tablet (10 mg total) by mouth 3 (three) times daily with meals. 90 tablet 0  . methylphenidate (RITALIN) 10 MG tablet Take 1 tablet (10 mg total) by mouth 3 (three) times daily with meals. 90 tablet 0  . methylphenidate (RITALIN) 10 MG tablet Take 1 tablet (10 mg total) by mouth 3 (three) times daily with meals. 90 tablet 0  . methylphenidate (RITALIN) 10 MG tablet Take 1 tablet (10 mg total) by mouth 3 (three) times daily with meals.  90 tablet 0  . [START ON 04/10/2020] methylphenidate (RITALIN) 10 MG tablet Take 1 tablet (10 mg total) by mouth 3 (three) times daily with meals. 90 tablet 0  . methylphenidate 36 MG PO CR tablet Take 1 tablet (36 mg total) by mouth daily. 30 tablet 0  . methylphenidate 36 MG PO CR tablet Take 1 tablet (36 mg total) by mouth daily. 30 tablet 0  . [START ON 04/10/2020] methylphenidate 36 MG PO CR tablet Take 1 tablet (36 mg total) by mouth daily. 30 tablet 0  . predniSONE (DELTASONE) 10 MG tablet Take 2 tablets (20 mg total) by mouth daily with breakfast. 60 tablet  0  . sulfamethoxazole-trimethoprim (BACTRIM DS) 800-160 MG tablet Take 1 tablet by mouth 2 (two) times daily. 28 tablet 0   No current facility-administered medications for this visit.    Allergies  Allergen Reactions  . Nsaids Other (See Comments)    Constipation   Subjective:  Patient arrived on time for today's session.  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  He stated that he is close to closing on selling at home in the coming weeks.  He continues to also work of substantial amount of his time throughout the week on the property as well.  He stated that he continues to have discussions at times with his parents about the progress and this is been a source of distress in their relationship for some time however, less so recently.  He stated that his parents have had more relational stress in their marriage, he has heard more arguments as he continues to stay with them in the home.  He shared how he was able to avoid getting pulled into the use arguments as he continues to live in the home.  Ways he tries to also set boundaries specifically in the relationship with his mother was shared, as he stated that she is "often negative and critical" in most discussions either he or his father may have where she is included.  Keeping his stress low by is keeping these boundaries in place and reminding himself of progress he is making for himself independently is his focus.  Interventions: CBT, supportive therapy, problem solving  Diagnoses:    ICD-10-CM   1. Bipolar affective disorder, rapid cycling ([place])  F31.9     Plan: Patient is to use CBT, mindfulness and coping skills to help manage decrease symptoms associated with their diagnosis.    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  Continue to take  steps toward effective communication in his relationships.   Long-term goal:   Reduce overall level, frequency, and intensity of the feelings of depression, anxiety evidenced by  decreased irritability, negative self talk, and improved functioning up to 80% of the time as reported by patient for at least 3 consecutive months.    Short-term goal:  Verbally express understanding of the relationship between feelings of depression, anxiety and their impact on thinking patterns and behaviors. Verbalize an understanding of the role that distorted thinking plays in creating fears, excessive worry, and ruminations. 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       Patient to report any concerns with his medications to his prescribing provider as needed.  Progress: progressing   Anson [place], [facility]

## 2020-03-27 ENCOUNTER — Ambulatory Visit: Payer: BC Managed Care – PPO | Admitting: Psychiatry

## 2020-03-29 ENCOUNTER — Encounter: Payer: Self-pay | Admitting: Psychiatry

## 2020-03-29 ENCOUNTER — Other Ambulatory Visit: Payer: Self-pay

## 2020-03-29 ENCOUNTER — Ambulatory Visit (INDEPENDENT_AMBULATORY_CARE_PROVIDER_SITE_OTHER): Payer: BC Managed Care – PPO | Admitting: Psychiatry

## 2020-03-29 ENCOUNTER — Ambulatory Visit: Payer: BC Managed Care – PPO | Admitting: Psychiatry

## 2020-03-29 VITALS — BP 156/96 | HR 94

## 2020-03-29 DIAGNOSIS — F319 Bipolar disorder, unspecified: Secondary | ICD-10-CM

## 2020-03-29 DIAGNOSIS — F902 Attention-deficit hyperactivity disorder, combined type: Secondary | ICD-10-CM

## 2020-03-29 DIAGNOSIS — F4310 Post-traumatic stress disorder, unspecified: Secondary | ICD-10-CM

## 2020-03-29 NOTE — Patient Instructions (Signed)
STOp aripiprazole today.  Start Vraylar 3 mg daily on Saturday
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
## 2020-04-05 ENCOUNTER — Other Ambulatory Visit: Payer: Self-pay

## 2020-04-05 ENCOUNTER — Telehealth: Payer: Self-pay | Admitting: Psychiatry

## 2020-04-05 ENCOUNTER — Ambulatory Visit (INDEPENDENT_AMBULATORY_CARE_PROVIDER_SITE_OTHER): Payer: BC Managed Care – PPO | Admitting: Mental Health

## 2020-04-05 DIAGNOSIS — F319 Bipolar disorder, unspecified: Secondary | ICD-10-CM | POA: Diagnosis not present

## 2020-04-05 MED ORDER — CARIPRAZINE HCL 3 MG PO CAPS: 3.0000 mg | ORAL_CAPSULE | Freq: Every day | ORAL | 0 refills | Status: DC

## 2020-04-05 NOTE — Telephone Encounter (Signed)
Pt requesting Rx for Vraylar 3 mg 1/d @ [facility]. Has tried samples and has 1 week left then will need to pick up new Rx from Pharmacy

## 2020-04-05 NOTE — Telephone Encounter (Signed)
Rx sent for Vraylar 3 mg, a PA will probably be initiated by pharmacy.

## 2020-04-05 NOTE — Progress Notes (Signed)
Psychotherapy Note  Name: Francisco Morgan Date: 04/05/20 MRN: [id number] DOB: Sep 12, 1975 PCP: [name], MD  Time spent: 54 minutes  Treatment: Individual therapy  Mental Status Exam: Appearance:   casual  Behavior:  appropriate  Motor:  WNL  Speech/Language:   Clear, coherent  Affect:  Full range  Mood:  anxious  Thought process:  normal  Thought content:   WNL  Sensory/Perceptual disturbances:   WNL  Orientation:  x4  Attention:  Good  Concentration:  Good  Memory:  WNL  Fund of knowledge:   Good  Insight:   Fair  Judgment:   developing  Impulse Control:  developing   Reported Symptoms:  Decreased sleep intermittently, some anxiety and irritability, ruminations  Risk Assessment: Danger to Self:  No Self-injurious Behavior: No Danger to Others: No Duty to Warn:no Physical Aggression / Violence:No  Access to Firearms a concern: No  Gang Involvement:No  Patient / guardian was educated about steps to take if suicide or homicide risk level increases between visits: yes While future psychiatric events cannot be accurately predicted, the patient does not currently require acute inpatient psychiatric care and does not currently meet [facility] involuntary commitment criteria.  Medications: Current Outpatient Medications  Medication Sig Dispense Refill  . ARIPiprazole (ABILIFY) 15 MG tablet Take 1 tablet (15 mg total) by mouth daily. 30 tablet 1  . b complex vitamins tablet Take 1 tablet by mouth daily. 100 tablet 3  . celecoxib (CELEBREX) 200 MG capsule TAKE (1) CAPSULE TWICE DAILY. 180 capsule 2  . Cholecalciferol (VITAMIN D) 2000 units CAPS 1 daily 100 capsule 3  . clonazePAM (KLONOPIN) 0.5 MG tablet Take 1 tablet (0.5 mg total) by mouth 3 (three) times daily as needed for anxiety. 60 tablet 5  . doxycycline (VIBRA-TABS) 100 MG tablet Take 1 tablet (100 mg total) by mouth 2 (two) times daily. 60 tablet 0  . esomeprazole (NEXIUM) 40 MG  capsule Take 1 capsule (40 mg total) by mouth daily before breakfast. 90 capsule 4  . HUMIRA PEN 40 MG/0.4ML PNKT Inject 40 mg into the muscle every 14 (fourteen) days.   1  . hydrocortisone 2.5 % ointment     . lamoTRIgine (LAMICTAL) 200 MG tablet Take 1 tablet (200 mg total) by mouth daily. 90 tablet 1  . methylphenidate (CONCERTA) 36 MG PO CR tablet Take 1 tablet (36 mg total) by mouth daily. 30 tablet 0  . methylphenidate (CONCERTA) 36 MG PO CR tablet Take 1 tablet (36 mg total) by mouth daily. 30 tablet 0  . methylphenidate (RITALIN) 10 MG tablet Take 1 tablet (10 mg total) by mouth 3 (three) times daily with meals. 90 tablet 0  . methylphenidate (RITALIN) 10 MG tablet Take 1 tablet (10 mg total) by mouth 3 (three) times daily with meals. 90 tablet 0  . methylphenidate (RITALIN) 10 MG tablet Take 1 tablet (10 mg total) by mouth 3 (three) times daily with meals. 90 tablet 0  . methylphenidate (RITALIN) 10 MG tablet Take 1 tablet (10 mg total) by mouth 3 (three) times daily with meals. 90 tablet 0  . methylphenidate (RITALIN) 10 MG tablet Take 1 tablet (10 mg total) by mouth 3 (three) times daily with meals. 90 tablet 0  . methylphenidate (RITALIN) 10 MG tablet Take 1 tablet (10 mg total) by mouth 3 (three) times daily with meals. 90 tablet 0  . [START ON 04/10/2020] methylphenidate (RITALIN) 10 MG tablet Take 1 tablet (10 mg total) by mouth  3 (three) times daily with meals. 90 tablet 0  . methylphenidate 36 MG PO CR tablet Take 1 tablet (36 mg total) by mouth daily. 30 tablet 0  . methylphenidate 36 MG PO CR tablet Take 1 tablet (36 mg total) by mouth daily. 30 tablet 0  . [START ON 04/10/2020] methylphenidate 36 MG PO CR tablet Take 1 tablet (36 mg total) by mouth daily. 30 tablet 0  . predniSONE (DELTASONE) 10 MG tablet Take 2 tablets (20 mg total) by mouth daily with breakfast. 60 tablet 0  . sulfamethoxazole-trimethoprim (BACTRIM DS) 800-160 MG tablet Take 1 tablet by mouth 2 (two) times  daily. 28 tablet 0   No current facility-administered medications for this visit.    Allergies  Allergen Reactions  . Nsaids Other (See Comments)    Constipation   Subjective:  Patient presents for session, sharing progress.  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  He is shared how he feels that he stays busy working often but has not allowed much time toward enjoyable activities.  He stated he noticed he has been spending more money recently, albeit in very small amounts.  We share with him how he was mindful, able to notice this change and how this is a component of self-care, wellness.  He stated that his medications have been helpful, feels he is adjusting well to a recent medication change and we encouraged him to contact our office with any concerns as needed.  He continues to process family relationships, more history, ways he is someone to his father most notably with a tendency toward "explosive anger, rage".  He stated that he feels his medications have helped significantly with his mood stabilization, however recognizing experiential family history as well as potential genetic loading toward these traits as causing factors.  He identified ways he feels he is changed over the years, abstaining from any substance use for years now after many years of use.  Facilitated patient in identifying positive steps toward increasing insight and facilitating changes with which he feels are beneficial to him long-term.  Interventions: CBT, supportive therapy, problem solving  Diagnoses:    ICD-10-CM   1. Bipolar affective disorder, rapid cycling ([place])  F31.9     Plan: Patient is to use CBT, mindfulness and coping skills to help manage decrease symptoms associated with their diagnosis.    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  Continue to take  steps toward effective communication in his relationships..   Long-term goal:   Reduce overall level, frequency, and intensity of the feelings of depression, anxiety evidenced by  decreased irritability, negative self talk, and improved functioning up to 80% of the time as reported by patient for at least 3 consecutive months.    Short-term goal:  Verbally express understanding of the relationship between feelings of depression, anxiety and their impact on thinking patterns and behaviors. Verbalize an understanding of the role that distorted thinking plays in creating fears, excessive worry, and ruminations. 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       Patient to report any concerns with his medications to his prescribing provider as needed.  Progress: progressing   Anson [place], [facility]

## 2020-04-10 ENCOUNTER — Ambulatory Visit: Payer: BC Managed Care – PPO | Admitting: Orthopedic Surgery

## 2020-04-10 DIAGNOSIS — M7042 Prepatellar bursitis, left knee: Secondary | ICD-10-CM | POA: Diagnosis not present

## 2020-04-10 DIAGNOSIS — M25871 Other specified joint disorders, right ankle and foot: Secondary | ICD-10-CM | POA: Diagnosis not present

## 2020-04-16 DIAGNOSIS — M9907 Segmental and somatic dysfunction of upper extremity: Secondary | ICD-10-CM | POA: Diagnosis not present

## 2020-04-16 DIAGNOSIS — M9905 Segmental and somatic dysfunction of pelvic region: Secondary | ICD-10-CM | POA: Diagnosis not present

## 2020-04-16 DIAGNOSIS — M9902 Segmental and somatic dysfunction of thoracic region: Secondary | ICD-10-CM | POA: Diagnosis not present

## 2020-04-16 DIAGNOSIS — M9903 Segmental and somatic dysfunction of lumbar region: Secondary | ICD-10-CM | POA: Diagnosis not present

## 2020-04-17 DIAGNOSIS — M205X2 Other deformities of toe(s) (acquired), left foot: Secondary | ICD-10-CM | POA: Diagnosis not present

## 2020-04-17 DIAGNOSIS — M21622 Bunionette of left foot: Secondary | ICD-10-CM | POA: Diagnosis not present

## 2020-04-17 DIAGNOSIS — M21961 Unspecified acquired deformity of right lower leg: Secondary | ICD-10-CM | POA: Diagnosis not present

## 2020-04-17 DIAGNOSIS — M21621 Bunionette of right foot: Secondary | ICD-10-CM | POA: Diagnosis not present

## 2020-04-18 ENCOUNTER — Encounter: Payer: Self-pay | Admitting: Orthopedic Surgery

## 2020-04-18 DIAGNOSIS — M25871 Other specified joint disorders, right ankle and foot: Secondary | ICD-10-CM | POA: Diagnosis not present

## 2020-04-18 DIAGNOSIS — M9903 Segmental and somatic dysfunction of lumbar region: Secondary | ICD-10-CM | POA: Diagnosis not present

## 2020-04-18 DIAGNOSIS — M9907 Segmental and somatic dysfunction of upper extremity: Secondary | ICD-10-CM | POA: Diagnosis not present

## 2020-04-18 DIAGNOSIS — M9902 Segmental and somatic dysfunction of thoracic region: Secondary | ICD-10-CM | POA: Diagnosis not present

## 2020-04-18 DIAGNOSIS — M9905 Segmental and somatic dysfunction of pelvic region: Secondary | ICD-10-CM | POA: Diagnosis not present

## 2020-04-18 MED ORDER — METHYLPREDNISOLONE ACETATE 40 MG/ML IJ SUSP
40.0000 mg | INTRAMUSCULAR | Status: AC | PRN
  Administered 2020-04-18: 40 mg via INTRA_ARTICULAR

## 2020-04-18 MED ORDER — LIDOCAINE HCL 1 % IJ SOLN
2.0000 mL | INTRAMUSCULAR | Status: AC | PRN
  Administered 2020-04-18: 2 mL
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
## 2020-04-18 NOTE — Patient Instructions (Addendum)
Continue to gently wash your beard  - use a circular motion when you do this and do it a few times a day.    Use the topical gel twice daily.     Please call if there is no improvement in your symptoms.      Flu immunization administered today.
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
## 2020-04-19 ENCOUNTER — Ambulatory Visit (INDEPENDENT_AMBULATORY_CARE_PROVIDER_SITE_OTHER): Payer: BC Managed Care – PPO | Admitting: Internal Medicine

## 2020-04-19 ENCOUNTER — Ambulatory Visit (INDEPENDENT_AMBULATORY_CARE_PROVIDER_SITE_OTHER): Payer: BC Managed Care – PPO | Admitting: Mental Health

## 2020-04-19 ENCOUNTER — Encounter: Payer: Self-pay | Admitting: Internal Medicine

## 2020-04-19 ENCOUNTER — Other Ambulatory Visit: Payer: Self-pay

## 2020-04-19 DIAGNOSIS — L738 Other specified follicular disorders: Secondary | ICD-10-CM | POA: Insufficient documentation

## 2020-04-19 DIAGNOSIS — Z23 Encounter for immunization: Secondary | ICD-10-CM | POA: Diagnosis not present

## 2020-04-19 DIAGNOSIS — F319 Bipolar disorder, unspecified: Secondary | ICD-10-CM | POA: Diagnosis not present

## 2020-04-19 MED ORDER — CLINDAMYCIN PHOS-BENZOYL PEROX 1.2-5 % EX GEL: CUTANEOUS | 0 refills | Status: DC

## 2020-04-19 NOTE — Progress Notes (Signed)
Psychotherapy Note  Name: DUAINE RADIN Date: 04/19/20 MRN: [id number] DOB: 1976/07/04 PCP: [name], MD  Time spent: 56 minutes  Treatment: Individual therapy  Mental Status Exam: Appearance:   casual  Behavior:  appropriate  Motor:  WNL  Speech/Language:   Clear, coherent  Affect:  Full range  Mood:  euthymic  Thought process:  normal  Thought content:   WNL  Sensory/Perceptual disturbances:   WNL  Orientation:  x4  Attention:  Good  Concentration:  Good  Memory:  WNL  Fund of knowledge:   WNL  Insight:   good  Judgment:   good  Impulse Control:  good   Reported Symptoms:  Decreased sleep intermittently, some anxiety and irritability, ruminations  Risk Assessment: Danger to Self:  No Self-injurious Behavior: No Danger to Others: No Duty to Warn:no Physical Aggression / Violence:No  Access to Firearms a concern: No  Gang Involvement:No  Patient / guardian was educated about steps to take if suicide or homicide risk level increases between visits: yes While future psychiatric events cannot be accurately predicted, the patient does not currently require acute inpatient psychiatric care and does not currently meet [facility] involuntary commitment criteria.  Medications: Current Outpatient Medications  Medication Sig Dispense Refill  . b complex vitamins tablet Take 1 tablet by mouth daily. 100 tablet 3  . cariprazine (VRAYLAR) capsule Take 1 capsule (3 mg total) by mouth daily. 90 capsule 0  . celecoxib (CELEBREX) 200 MG capsule TAKE (1) CAPSULE TWICE DAILY. 180 capsule 2  . Cholecalciferol (VITAMIN D) 2000 units CAPS 1 daily 100 capsule 3  . Clindamycin-Benzoyl Per, Refr, gel Apply twice daily 45 g 0  . clonazePAM (KLONOPIN) 0.5 MG tablet Take 1 tablet (0.5 mg total) by mouth 3 (three) times daily as needed for anxiety. 60 tablet 5  . esomeprazole (NEXIUM) 40 MG capsule Take 1 capsule (40 mg total) by mouth daily before  breakfast. 90 capsule 4  . HUMIRA PEN 40 MG/0.4ML PNKT Inject 40 mg into the muscle every 14 (fourteen) days.   1  . hydrocortisone 2.5 % ointment     . lamoTRIgine (LAMICTAL) 200 MG tablet Take 1 tablet (200 mg total) by mouth daily. 90 tablet 1  . methylphenidate (CONCERTA) 36 MG PO CR tablet Take 1 tablet (36 mg total) by mouth daily. 30 tablet 0  . methylphenidate (RITALIN) 10 MG tablet Take 1 tablet (10 mg total) by mouth 3 (three) times daily with meals. 90 tablet 0  . methylphenidate 36 MG PO CR tablet Take 1 tablet (36 mg total) by mouth daily. 30 tablet 0   No current facility-administered medications for this visit.    Allergies  Allergen Reactions  . Nsaids Other (See Comments)    Constipation   Subjective:   Patient presents on time for session. Shared progress, looks forward to closing on a property sale tomorrow. 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 Also, continues to work on his parents property, has made some progress over the last two weeks there as well. Went on to share a host of medical issues with which he continues to cope with some chronic pain levels. Recently received his prednisone injection to alleviate hip pain and plans to see his podiatrist for an injection there as well. Family relationships for assess, you shared recent interactions with his parents which appear to be less strained recently. Has made some attempts to be more social, tended a recent Leisure centre manager  to "test my social anxiety", as you stay it's been several months since he's attendance such an event. He finally characterized being able to put a suit on again, I'll be at he said he's getting some weight he feels in part due to one of his psychiatric medications which was changed about a month ago; Reports positive effects otherwise. Assisted him in acknowledging positive steps he's taken over the last few months, facilitating his self acknowledgment and providing support and  understanding throughout.  Interventions: CBT, supportive therapy, problem solving  Diagnoses:    ICD-10-CM   1. Bipolar affective disorder, rapid cycling ([place])  F31.9     Plan: Patient is to use CBT, mindfulness and coping skills to help manage decrease symptoms associated with their diagnosis.    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  Continue to take steps toward effective communication in his relationships..   Long-term goal:   Reduce overall level, frequency, and intensity of the feelings of depression, anxiety evidenced by  decreased irritability, negative self talk, and improved functioning up to 80% of the time as reported by patient for at least 3 consecutive months.    Short-term goal:  Verbally express understanding of the relationship between feelings of depression, anxiety and their impact on thinking patterns and behaviors. Verbalize an understanding of the role that distorted thinking plays in creating fears, excessive worry, and ruminations. 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       Patient to report any concerns with his medications to his prescribing provider as needed.  Progress: progressing   Anson [place], [facility]

## 2020-04-19 NOTE — Assessment & Plan Note (Signed)
Acute Symptoms c/w folliculitis barbae that sounds infected Continue to use antibacterial cleaner Start clindamycin-benzyl peroxide gel twice daily Call if no improvement

## 2020-04-20 DIAGNOSIS — M9907 Segmental and somatic dysfunction of upper extremity: Secondary | ICD-10-CM | POA: Diagnosis not present

## 2020-04-20 DIAGNOSIS — M9905 Segmental and somatic dysfunction of pelvic region: Secondary | ICD-10-CM | POA: Diagnosis not present

## 2020-04-20 DIAGNOSIS — M9902 Segmental and somatic dysfunction of thoracic region: Secondary | ICD-10-CM | POA: Diagnosis not present

## 2020-04-20 DIAGNOSIS — M9903 Segmental and somatic dysfunction of lumbar region: Secondary | ICD-10-CM | POA: Diagnosis not present

## 2020-05-02 DIAGNOSIS — L03032 Cellulitis of left toe: Secondary | ICD-10-CM | POA: Diagnosis not present

## 2020-05-03 ENCOUNTER — Ambulatory Visit (INDEPENDENT_AMBULATORY_CARE_PROVIDER_SITE_OTHER): Payer: BC Managed Care – PPO | Admitting: Mental Health

## 2020-05-03 ENCOUNTER — Other Ambulatory Visit: Payer: Self-pay

## 2020-05-03 DIAGNOSIS — F319 Bipolar disorder, unspecified: Secondary | ICD-10-CM

## 2020-05-03 NOTE — Progress Notes (Signed)
Psychotherapy Note  Name: Francisco Morgan Date: 05/03/20 MRN: [id number] DOB: [birth year] PCP: [name], MD  Time spent: 53 minutes  Treatment: Individual therapy  Mental Status Exam: Appearance:   casual  Behavior:  appropriate  Motor:  WNL  Speech/Language:   Clear, coherent  Affect:  Full range  Mood:  euthymic  Thought process:  normal  Thought content:   WNL  Sensory/Perceptual disturbances:   WNL  Orientation:  x4  Attention:  Good  Concentration:  Good  Memory:  WNL  Fund of knowledge:   WNL  Insight:   good  Judgment:   good  Impulse Control:  good   Reported Symptoms:  Decreased sleep intermittently, some anxiety and irritability, ruminations  Risk Assessment: Danger to Self:  No Self-injurious Behavior: No Danger to Others: No Duty to Warn:no Physical Aggression / Violence:No  Access to Firearms a concern: No  Gang Involvement:No  Patient / guardian was educated about steps to take if suicide or homicide risk level increases between visits: yes While future psychiatric events cannot be accurately predicted, the patient does not currently require acute inpatient psychiatric care and does not currently meet [facility] involuntary commitment criteria.  Medications: Current Outpatient Medications  Medication Sig Dispense Refill  . b complex vitamins tablet Take 1 tablet by mouth daily. 100 tablet 3  . cariprazine (VRAYLAR) capsule Take 1 capsule (3 mg total) by mouth daily. 90 capsule 0  . celecoxib (CELEBREX) 200 MG capsule TAKE (1) CAPSULE TWICE DAILY. 180 capsule 2  . Cholecalciferol (VITAMIN D) 2000 units CAPS 1 daily 100 capsule 3  . Clindamycin-Benzoyl Per, Refr, gel Apply twice daily 45 g 0  . clonazePAM (KLONOPIN) 0.5 MG tablet Take 1 tablet (0.5 mg total) by mouth 3 (three) times daily as needed for anxiety. 60 tablet 5  . esomeprazole (NEXIUM) 40 MG capsule Take 1 capsule (40 mg total) by mouth daily before  breakfast. 90 capsule 4  . HUMIRA PEN 40 MG/0.4ML PNKT Inject 40 mg into the muscle every 14 (fourteen) days.   1  . hydrocortisone 2.5 % ointment     . lamoTRIgine (LAMICTAL) 200 MG tablet Take 1 tablet (200 mg total) by mouth daily. 90 tablet 1  . methylphenidate (CONCERTA) 36 MG PO CR tablet Take 1 tablet (36 mg total) by mouth daily. 30 tablet 0  . methylphenidate (RITALIN) 10 MG tablet Take 1 tablet (10 mg total) by mouth 3 (three) times daily with meals. 90 tablet 0  . methylphenidate 36 MG PO CR tablet Take 1 tablet (36 mg total) by mouth daily. 30 tablet 0   No current facility-administered medications for this visit.    Allergies  Allergen Reactions  . Nsaids Other (See Comments)    Constipation   Subjective:   Patient presents for session, sharing progress.  He stated that he has been busy over the last 2 weeks attending and insurance seminar for work as well as attending doctor appointments.  He shared how he has had some positive changes recently and able to be less stressed about his finances.  He stated that overall, the communication with his parents, his mother specifically has improved.  He stated that they have continued disagreements at times typically over progress he is making at their house with repairs, construction to ready their property for sale.  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  He identifies the continued theme of not feeling validated by his parents for some of the changes he has made over the years which he feels have been significant.  Facilitated his self acknowledgment of making some of these changes and identifying them in session.  Interventions: CBT, supportive therapy, problem solving  Diagnoses:    ICD-10-CM   1. Bipolar affective disorder, rapid cycling ([place])  F31.9     Plan: Patient is to use CBT, mindfulness and coping skills to help  manage decrease symptoms associated with their diagnosis.    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  Continue to take steps toward effective communication in his relationship with is parents to keep stress low.    Long-term goal:   Reduce overall level, frequency, and intensity of the feelings of depression, anxiety evidenced by  decreased irritability, negative self talk, and improved functioning up to 80% of the time as reported by patient for at least 3 consecutive months.    Short-term goal:  Verbally express understanding of the relationship between feelings of depression, anxiety and their impact on thinking patterns and behaviors. Verbalize an understanding of the role that distorted thinking plays in creating fears, excessive worry, and ruminations. 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       Patient to report any concerns with his medications to his prescribing provider as needed.  Progress: progressing   Anson [place], [facility]

## 2020-05-04 DIAGNOSIS — M9907 Segmental and somatic dysfunction of upper extremity: Secondary | ICD-10-CM | POA: Diagnosis not present

## 2020-05-04 DIAGNOSIS — M9905 Segmental and somatic dysfunction of pelvic region: Secondary | ICD-10-CM | POA: Diagnosis not present

## 2020-05-04 DIAGNOSIS — M9903 Segmental and somatic dysfunction of lumbar region: Secondary | ICD-10-CM | POA: Diagnosis not present

## 2020-05-04 DIAGNOSIS — M9902 Segmental and somatic dysfunction of thoracic region: Secondary | ICD-10-CM | POA: Diagnosis not present

## 2020-05-11 DIAGNOSIS — M9905 Segmental and somatic dysfunction of pelvic region: Secondary | ICD-10-CM | POA: Diagnosis not present

## 2020-05-11 DIAGNOSIS — M9907 Segmental and somatic dysfunction of upper extremity: Secondary | ICD-10-CM | POA: Diagnosis not present

## 2020-05-11 DIAGNOSIS — M9902 Segmental and somatic dysfunction of thoracic region: Secondary | ICD-10-CM | POA: Diagnosis not present

## 2020-05-11 DIAGNOSIS — M9903 Segmental and somatic dysfunction of lumbar region: Secondary | ICD-10-CM | POA: Diagnosis not present

## 2020-05-15 ENCOUNTER — Other Ambulatory Visit: Payer: Self-pay | Admitting: Internal Medicine

## 2020-05-17 ENCOUNTER — Ambulatory Visit (INDEPENDENT_AMBULATORY_CARE_PROVIDER_SITE_OTHER): Payer: BC Managed Care – PPO | Admitting: Mental Health

## 2020-05-17 ENCOUNTER — Other Ambulatory Visit: Payer: Self-pay

## 2020-05-17 DIAGNOSIS — Z1589 Genetic susceptibility to other disease: Secondary | ICD-10-CM | POA: Diagnosis not present

## 2020-05-17 DIAGNOSIS — F319 Bipolar disorder, unspecified: Secondary | ICD-10-CM | POA: Diagnosis not present

## 2020-05-17 DIAGNOSIS — L4059 Other psoriatic arthropathy: Secondary | ICD-10-CM | POA: Diagnosis not present

## 2020-05-17 DIAGNOSIS — M1A09X0 Idiopathic chronic gout, multiple sites, without tophus (tophi): Secondary | ICD-10-CM | POA: Diagnosis not present

## 2020-05-17 DIAGNOSIS — L400 Psoriasis vulgaris: Secondary | ICD-10-CM | POA: Diagnosis not present

## 2020-05-17 NOTE — Progress Notes (Signed)
Psychotherapy Note  Name: Francisco Morgan Date: 05/17/20 MRN: [id number] DOB: 11-04-1975 PCP: [name], MD  Time spent: 53 minutes  Treatment: Individual therapy  Mental Status Exam: Appearance:   casual  Behavior:  appropriate  Motor:  WNL  Speech/Language:   Clear, coherent  Affect:  Full range  Mood:  euthymic  Thought process:  normal  Thought content:   WNL  Sensory/Perceptual disturbances:   WNL  Orientation:  x4  Attention:  Good  Concentration:  Good  Memory:  WNL  Fund of knowledge:   WNL  Insight:   good  Judgment:   good  Impulse Control:  good   Reported Symptoms:  Decreased sleep intermittently, some anxiety and irritability, ruminations  Risk Assessment: Danger to Self:  No Self-injurious Behavior: No Danger to Others: No Duty to Warn:no Physical Aggression / Violence:No  Access to Firearms a concern: No  Gang Involvement:No  Patient / guardian was educated about steps to take if suicide or homicide risk level increases between visits: yes While future psychiatric events cannot be accurately predicted, the patient does not currently require acute inpatient psychiatric care and does not currently meet [facility] involuntary commitment criteria.  Medications: Current Outpatient Medications  Medication Sig Dispense Refill  . b complex vitamins tablet Take 1 tablet by mouth daily. 100 tablet 3  . cariprazine (VRAYLAR) capsule Take 1 capsule (3 mg total) by mouth daily. 90 capsule 0  . celecoxib (CELEBREX) 200 MG capsule TAKE (1) CAPSULE TWICE DAILY. 180 capsule 2  . Cholecalciferol (VITAMIN D) 2000 units CAPS 1 daily 100 capsule 3  . Clindamycin-Benzoyl Per, Refr, gel Apply twice daily 45 g 0  . clonazePAM (KLONOPIN) 0.5 MG tablet Take 1 tablet (0.5 mg total) by mouth 3 (three) times daily as needed for anxiety. 60 tablet 5  . esomeprazole (NEXIUM) 40 MG capsule Take 1 capsule (40 mg total) by mouth daily before  breakfast. 90 capsule 4  . HUMIRA PEN 40 MG/0.4ML PNKT Inject 40 mg into the muscle every 14 (fourteen) days.   1  . hydrocortisone 2.5 % ointment     . lamoTRIgine (LAMICTAL) 200 MG tablet Take 1 tablet (200 mg total) by mouth daily. 90 tablet 1  . methylphenidate (CONCERTA) 36 MG PO CR tablet Take 1 tablet (36 mg total) by mouth daily. 30 tablet 0  . methylphenidate (RITALIN) 10 MG tablet Take 1 tablet (10 mg total) by mouth 3 (three) times daily with meals. 90 tablet 0  . methylphenidate 36 MG PO CR tablet Take 1 tablet (36 mg total) by mouth daily. 30 tablet 0   No current facility-administered medications for this visit.    Allergies  Allergen Reactions  . Nsaids Other (See Comments)    Constipation   Subjective: Patient presents for session on time.  Shared progress, recent positive changes.  He stated that he has helped his parents with their purchasing of the another property.  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  Shared how he feels they are getting along better recently, some better communication overall.  He stated that he has been keeping up with their construction repairs to their other home as well.  Through guided discovery, he shared some thoughts and feelings related to appearing to be validated more particularly by his mother and some recent discussions, while also verbalizing ways that he is trying to effectively communicate to them to promote their  having a better relationship overall.  We reviewed how being mindful of expectations and how he would like to react to some comments that can be made at times effectively was explored.  Interventions: CBT, supportive therapy, problem solving  Diagnoses:    ICD-10-CM   1. Bipolar affective disorder, rapid cycling ([place])  F31.9     Plan: Patient is to use CBT, mindfulness and coping skills to help manage decrease symptoms associated with their diagnosis.     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  Continue to take steps toward effective communication in his relationship with is parents to keep stress low.    Long-term goal:   Reduce overall level, frequency, and intensity of the feelings of depression, anxiety evidenced by  decreased irritability, negative self talk, and improved functioning up to 80% of the time as reported by patient for at least 3 consecutive months.    Short-term goal:  Verbally express understanding of the relationship between feelings of depression, anxiety and their impact on thinking patterns and behaviors. Verbalize an understanding of the role that distorted thinking plays in creating fears, excessive worry, and ruminations. 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       Patient to report any concerns with his medications to his prescribing provider as needed.  Progress: progressing   Anson [place], [facility]

## 2020-05-18 DIAGNOSIS — M9902 Segmental and somatic dysfunction of thoracic region: Secondary | ICD-10-CM | POA: Diagnosis not present

## 2020-05-18 DIAGNOSIS — M9905 Segmental and somatic dysfunction of pelvic region: Secondary | ICD-10-CM | POA: Diagnosis not present

## 2020-05-18 DIAGNOSIS — M9903 Segmental and somatic dysfunction of lumbar region: Secondary | ICD-10-CM | POA: Diagnosis not present

## 2020-05-18 DIAGNOSIS — M9907 Segmental and somatic dysfunction of upper extremity: Secondary | ICD-10-CM | POA: Diagnosis not present

## 2020-05-25 DIAGNOSIS — M9905 Segmental and somatic dysfunction of pelvic region: Secondary | ICD-10-CM | POA: Diagnosis not present

## 2020-05-25 DIAGNOSIS — M9903 Segmental and somatic dysfunction of lumbar region: Secondary | ICD-10-CM | POA: Diagnosis not present

## 2020-05-25 DIAGNOSIS — M9902 Segmental and somatic dysfunction of thoracic region: Secondary | ICD-10-CM | POA: Diagnosis not present

## 2020-05-25 DIAGNOSIS — M9907 Segmental and somatic dysfunction of upper extremity: Secondary | ICD-10-CM | POA: Diagnosis not present

## 2020-05-29 ENCOUNTER — Other Ambulatory Visit: Payer: Self-pay | Admitting: Internal Medicine

## 2020-05-30 ENCOUNTER — Ambulatory Visit: Payer: BC Managed Care – PPO | Admitting: Psychiatry

## 2020-05-30 ENCOUNTER — Encounter: Payer: Self-pay | Admitting: Psychiatry

## 2020-05-30 ENCOUNTER — Ambulatory Visit (INDEPENDENT_AMBULATORY_CARE_PROVIDER_SITE_OTHER): Payer: BC Managed Care – PPO | Admitting: Psychiatry

## 2020-05-30 ENCOUNTER — Other Ambulatory Visit: Payer: Self-pay

## 2020-05-30 DIAGNOSIS — F310 Bipolar disorder, current episode hypomanic: Secondary | ICD-10-CM

## 2020-05-30 DIAGNOSIS — F4310 Post-traumatic stress disorder, unspecified: Secondary | ICD-10-CM

## 2020-05-30 DIAGNOSIS — F902 Attention-deficit hyperactivity disorder, combined type: Secondary | ICD-10-CM

## 2020-05-30 DIAGNOSIS — F319 Bipolar disorder, unspecified: Secondary | ICD-10-CM | POA: Diagnosis not present

## 2020-05-30 MED ORDER — LAMOTRIGINE 200 MG PO TABS: 200.0000 mg | ORAL_TABLET | Freq: Every day | ORAL | 1 refills | Status: DC

## 2020-05-30 MED ORDER — CARIPRAZINE HCL 3 MG PO CAPS: 3.0000 mg | ORAL_CAPSULE | Freq: Every day | ORAL | 0 refills | Status: DC

## 2020-05-30 MED ORDER — CLONAZEPAM 0.5 MG PO TABS: 0.5000 mg | ORAL_TABLET | Freq: Three times a day (TID) | ORAL | 5 refills | Status: DC | PRN
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
## 2020-05-31 ENCOUNTER — Other Ambulatory Visit: Payer: Self-pay | Admitting: Psychiatry

## 2020-05-31 DIAGNOSIS — F902 Attention-deficit hyperactivity disorder, combined type: Secondary | ICD-10-CM

## 2020-06-06 ENCOUNTER — Ambulatory Visit (INDEPENDENT_AMBULATORY_CARE_PROVIDER_SITE_OTHER): Payer: BC Managed Care – PPO | Admitting: Mental Health

## 2020-06-06 ENCOUNTER — Other Ambulatory Visit: Payer: Self-pay

## 2020-06-06 DIAGNOSIS — F319 Bipolar disorder, unspecified: Secondary | ICD-10-CM | POA: Diagnosis not present

## 2020-06-06 NOTE — Progress Notes (Signed)
Psychotherapy Note  Name: Francisco Morgan Date: 06/06/20 MRN: [id number] DOB: 09-May-1976 PCP: [name], MD  Time spent: 54 minutes  Treatment: Individual therapy  Mental Status Exam: Appearance:   casual  Behavior:  appropriate  Motor:  WNL  Speech/Language:   Clear, coherent  Affect:  Full range  Mood:  euthymic  Thought process:  normal  Thought content:   WNL  Sensory/Perceptual disturbances:   WNL  Orientation:  x4  Attention:  Good  Concentration:  Good  Memory:  WNL  Fund of knowledge:   WNL  Insight:   good  Judgment:   good  Impulse Control:  good   Reported Symptoms:  Decreased sleep intermittently, some anxiety and irritability, ruminations  Risk Assessment: Danger to Self:  No Self-injurious Behavior: No Danger to Others: No Duty to Warn:no Physical Aggression / Violence:No  Access to Firearms a concern: No  Gang Involvement:No  Patient / guardian was educated about steps to take if suicide or homicide risk level increases between visits: yes While future psychiatric events cannot be accurately predicted, the patient does not currently require acute inpatient psychiatric care and does not currently meet [facility] involuntary commitment criteria.  Medications: Current Outpatient Medications  Medication Sig Dispense Refill  . b complex vitamins tablet Take 1 tablet by mouth daily. 100 tablet 3  . cariprazine (VRAYLAR) capsule Take 1 capsule (3 mg total) by mouth daily. 90 capsule 0  . celecoxib (CELEBREX) 200 MG capsule TAKE (1) CAPSULE TWICE DAILY. Annual appt is overdue must see provider for future refills 60 capsule 0  . Cholecalciferol (VITAMIN D) 2000 units CAPS 1 daily 100 capsule 3  . Clindamycin-Benzoyl Per, Refr, gel Apply twice daily 45 g 0  . clonazePAM (KLONOPIN) 0.5 MG tablet Take 1 tablet (0.5 mg total) by mouth 3 (three) times daily as needed for anxiety. 60 tablet 5  . esomeprazole (NEXIUM) 40 MG  capsule Take 1 capsule (40 mg total) by mouth daily before breakfast. 90 capsule 4  . HUMIRA PEN 40 MG/0.4ML PNKT Inject 40 mg into the muscle every 14 (fourteen) days.   1  . hydrocortisone 2.5 % ointment     . lamoTRIgine (LAMICTAL) 200 MG tablet Take 1 tablet (200 mg total) by mouth daily. 90 tablet 1  . methylphenidate (CONCERTA) 36 MG PO CR tablet Take 1 tablet (36 mg total) by mouth daily. 30 tablet 0  . methylphenidate (RITALIN) 10 MG tablet TAKE 1 TABLET 3 TIMES DAILY WITH MEALS. 90 tablet 0  . methylphenidate 36 MG PO CR tablet Take 1 tablet (36 mg total) by mouth daily. 30 tablet 0  . METHYLPHENIDATE 36 MG PO CR tablet TAKE 1 TABLET EACH DAY. 30 tablet 0   No current facility-administered medications for this visit.    Allergies  Allergen Reactions  . Nsaids Other (See Comments)    Constipation   Subjective: Patient presents for session on time.  He shared progress, focusing on his relationship with his parents where they continue to have some relational strain related to finances.  He stated that most of their disagreements particularly with his mother who manages the finances centered around the amount of spending involved in their home renovation project.  He went on to detail experiences, his attempts at communicating in these instances, not feeling heard and at times not fully respected for his knowledge.  Facilitated his identifying how he feels he can continue to effectively communicate in the situations as well as what  he knows to be true about his ability and skills in his attempts at trying to keep the relationship and has good place as possible.  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  Interventions: CBT, supportive therapy, problem solving  Diagnoses:  No diagnosis found.  Plan: Patient is to use CBT, mindfulness and coping skills to help manage decrease symptoms associated with their diagnosis.    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  Continue to take steps toward effective communication in his relationship with is parents to keep stress low.    Long-term goal:   Reduce overall level, frequency, and intensity of the feelings of depression, anxiety evidenced by  decreased irritability, negative self talk, and improved functioning up to 80% of the time as reported by patient for at least 3 consecutive months.    Short-term goal:  Verbally express understanding of the relationship between feelings of depression, anxiety and their impact on thinking patterns and behaviors. Verbalize an understanding of the role that distorted thinking plays in creating fears, excessive worry, and ruminations. 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       Patient to report any concerns with his medications to his prescribing provider as needed.  Progress: progressing   Anson [place], [facility]

## 2020-06-08 DIAGNOSIS — M9903 Segmental and somatic dysfunction of lumbar region: Secondary | ICD-10-CM | POA: Diagnosis not present

## 2020-06-08 DIAGNOSIS — M9905 Segmental and somatic dysfunction of pelvic region: Secondary | ICD-10-CM | POA: Diagnosis not present

## 2020-06-08 DIAGNOSIS — M9907 Segmental and somatic dysfunction of upper extremity: Secondary | ICD-10-CM | POA: Diagnosis not present

## 2020-06-08 DIAGNOSIS — M9902 Segmental and somatic dysfunction of thoracic region: Secondary | ICD-10-CM | POA: Diagnosis not present

## 2020-06-13 ENCOUNTER — Ambulatory Visit (INDEPENDENT_AMBULATORY_CARE_PROVIDER_SITE_OTHER): Payer: BC Managed Care – PPO | Admitting: Internal Medicine

## 2020-06-13 ENCOUNTER — Other Ambulatory Visit: Payer: Self-pay

## 2020-06-13 ENCOUNTER — Encounter: Payer: Self-pay | Admitting: Internal Medicine

## 2020-06-13 DIAGNOSIS — E559 Vitamin D deficiency, unspecified: Secondary | ICD-10-CM | POA: Diagnosis not present

## 2020-06-13 DIAGNOSIS — R635 Abnormal weight gain: Secondary | ICD-10-CM | POA: Diagnosis not present

## 2020-06-13 DIAGNOSIS — F909 Attention-deficit hyperactivity disorder, unspecified type: Secondary | ICD-10-CM

## 2020-06-13 DIAGNOSIS — L4050 Arthropathic psoriasis, unspecified: Secondary | ICD-10-CM

## 2020-06-13 DIAGNOSIS — F3177 Bipolar disorder, in partial remission, most recent episode mixed: Secondary | ICD-10-CM

## 2020-06-13 DIAGNOSIS — R7989 Other specified abnormal findings of blood chemistry: Secondary | ICD-10-CM

## 2020-06-13 LAB — COMPREHENSIVE METABOLIC PANEL
ALT: 40 U/L (ref 0–53)
AST: 34 U/L (ref 0–37)
Albumin: 4.4 g/dL (ref 3.5–5.2)
Alkaline Phosphatase: 61 U/L (ref 39–117)
BUN: 12 mg/dL (ref 6–23)
CO2: 29 mEq/L (ref 19–32)
Calcium: 9.7 mg/dL (ref 8.4–10.5)
Chloride: 103 mEq/L (ref 96–112)
Creatinine, Ser: 0.77 mg/dL (ref 0.40–1.50)
GFR: 109.67 mL/min (ref >60.00)
Glucose, Bld: 100 mg/dL — ABNORMAL HIGH (ref 70–99)
Potassium: 4.1 mEq/L (ref 3.5–5.1)
Sodium: 138 mEq/L (ref 135–145)
Total Bilirubin: 1 mg/dL (ref 0.2–1.2)
Total Protein: 6.9 g/dL (ref 6.0–8.3)

## 2020-06-13 LAB — TSH: TSH: 1.73 u[IU]/mL (ref 0.35–4.50)

## 2020-06-13 LAB — T4, FREE: Free T4: 0.79 ng/dL (ref 0.60–1.60)

## 2020-06-13 LAB — TESTOSTERONE: Testosterone: 300.79 ng/dL (ref 300.00–890.00)

## 2020-06-13 NOTE — Assessment & Plan Note (Signed)
Vit D 

## 2020-06-13 NOTE — Assessment & Plan Note (Signed)
Wt Readings from Last 3 Encounters:  06/13/20 198 lb 6.4 oz (90 kg)  04/19/20 188 lb 9.6 oz (85.5 kg)  02/29/20 173 lb (78.5 kg)

## 2020-06-13 NOTE — Patient Instructions (Addendum)
Wt Readings from Last 3 Encounters:  06/13/20 198 lb 6.4 oz (90 kg)  04/19/20 188 lb 9.6 oz (85.5 kg)  02/29/20 173 lb (78.5 kg)    These suggestions will probably help you to improve your metabolism if you are not overweight and to lose weight if you are overweight: 1.  Reduce your consumption of sugars and starches.  Eliminate high fructose corn syrup from your diet.  Reduce your consumption of processed foods.  For desserts try to have seasonal fruits, berries, nuts, cheeses or dark chocolate with more than 70% cacao. 2.  Do not snack 3.  You do not have to eat breakfast.  If you choose to have breakfast - eat plain greek yogurt, eggs, oatmeal (without sugar) - use honey if you need to. 4.  Drink water, freshly brewed unsweetened tea (green, black or herbal) or coffee.  Do not drink sodas including diet sodas , juices, beverages sweetened with artificial sweeteners. 5.  Reduce your consumption of refined grains. 6.  Avoid protein drinks such as Optifast, Slim fast etc. Eat chicken, fish, meat, dairy and beans for your sources of protein. 7.  Natural unprocessed fats like cold pressed virgin olive oil, butter, coconut oil are good for you.  Eat avocados. 8.  Increase your consumption of fiber.  Fruits, berries, vegetables, whole grains, flaxseed, chia seeds, beans, popcorn, nuts, oatmeal are good sources of fiber 9.  Use vinegar in your diet, i.e. apple cider vinegar, red wine or balsamic vinegar 10.  You can try fasting.  For example you can skip breakfast and lunch every other day (24-hour fast) 11.  Stress reduction, good night sleep, relaxation, meditation, yoga and other physical activity is likely to help you to maintain low weight too. 12.  If you drink alcohol, limit your alcohol intake to no more than 2 drinks a day.    You can try [place]'s [place] Mushroom extract or capsules for memory

## 2020-06-13 NOTE — Assessment & Plan Note (Signed)
11/21 wt gain 25 lbs since July - changed from Depakote (due to brain fog) to Abilify x 2 mo, now on Vraylar in Sept
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
## 2020-06-13 NOTE — Assessment & Plan Note (Signed)
Try lion's main miushroom

## 2020-06-14 ENCOUNTER — Other Ambulatory Visit: Payer: Self-pay | Admitting: Internal Medicine

## 2020-06-14 DIAGNOSIS — R635 Abnormal weight gain: Secondary | ICD-10-CM

## 2020-06-15 DIAGNOSIS — M9903 Segmental and somatic dysfunction of lumbar region: Secondary | ICD-10-CM | POA: Diagnosis not present

## 2020-06-15 DIAGNOSIS — M9902 Segmental and somatic dysfunction of thoracic region: Secondary | ICD-10-CM | POA: Diagnosis not present

## 2020-06-15 DIAGNOSIS — M9905 Segmental and somatic dysfunction of pelvic region: Secondary | ICD-10-CM | POA: Diagnosis not present

## 2020-06-15 DIAGNOSIS — M9907 Segmental and somatic dysfunction of upper extremity: Secondary | ICD-10-CM | POA: Diagnosis not present

## 2020-06-18 DIAGNOSIS — R7989 Other specified abnormal findings of blood chemistry: Secondary | ICD-10-CM | POA: Insufficient documentation

## 2020-06-18 NOTE — Assessment & Plan Note (Signed)
Try lion's mane mushroom

## 2020-06-18 NOTE — Assessment & Plan Note (Signed)
decreased for age.  Likely due to meds.  We will try to manage with supplements

## 2020-06-22 DIAGNOSIS — M9903 Segmental and somatic dysfunction of lumbar region: Secondary | ICD-10-CM | POA: Diagnosis not present

## 2020-06-22 DIAGNOSIS — M9902 Segmental and somatic dysfunction of thoracic region: Secondary | ICD-10-CM | POA: Diagnosis not present

## 2020-06-22 DIAGNOSIS — M9907 Segmental and somatic dysfunction of upper extremity: Secondary | ICD-10-CM | POA: Diagnosis not present

## 2020-06-22 DIAGNOSIS — M9905 Segmental and somatic dysfunction of pelvic region: Secondary | ICD-10-CM | POA: Diagnosis not present

## 2020-06-28 ENCOUNTER — Other Ambulatory Visit: Payer: Self-pay

## 2020-06-28 ENCOUNTER — Ambulatory Visit (INDEPENDENT_AMBULATORY_CARE_PROVIDER_SITE_OTHER): Payer: BC Managed Care – PPO | Admitting: Mental Health

## 2020-06-28 ENCOUNTER — Other Ambulatory Visit: Payer: Self-pay | Admitting: Psychiatry

## 2020-06-28 ENCOUNTER — Other Ambulatory Visit: Payer: Self-pay | Admitting: Internal Medicine

## 2020-06-28 DIAGNOSIS — F902 Attention-deficit hyperactivity disorder, combined type: Secondary | ICD-10-CM

## 2020-06-28 DIAGNOSIS — F319 Bipolar disorder, unspecified: Secondary | ICD-10-CM

## 2020-06-28 NOTE — Progress Notes (Signed)
Psychotherapy Note  Name: Francisco Morgan Date: 06/28/20 MRN: [id number] DOB: [birth year] PCP: [name], MD  Time spent: 53 minutes  Treatment: Individual therapy  Mental Status Exam: Appearance:   casual  Behavior:  appropriate  Motor:  WNL  Speech/Language:   Clear, coherent  Affect:  Full range  Mood:  euthymic  Thought process:  normal  Thought content:   WNL  Sensory/Perceptual disturbances:   WNL  Orientation:  x4  Attention:  Good  Concentration:  Good  Memory:  WNL  Fund of knowledge:   WNL  Insight:   good  Judgment:   good  Impulse Control:  good   Reported Symptoms:  Decreased sleep intermittently, some anxiety and irritability, ruminations  Risk Assessment: Danger to Self:  No Self-injurious Behavior: No Danger to Others: No Duty to Warn:no Physical Aggression / Violence:No  Access to Firearms a concern: No  Gang Involvement:No  Patient / guardian was educated about steps to take if suicide or homicide risk level increases between visits: yes While future psychiatric events cannot be accurately predicted, the patient does not currently require acute inpatient psychiatric care and does not currently meet [facility] involuntary commitment criteria.  Medications: Current Outpatient Medications  Medication Sig Dispense Refill  . b complex vitamins tablet Take 1 tablet by mouth daily. 100 tablet 3  . cariprazine (VRAYLAR) capsule Take 1 capsule (3 mg total) by mouth daily. 90 capsule 0  . celecoxib (CELEBREX) 200 MG capsule TAKE (1) CAPSULE TWICE DAILY. Annual appt is overdue must see provider for future refills 60 capsule 0  . Cholecalciferol (VITAMIN D) 2000 units CAPS 1 daily 100 capsule 3  . clonazePAM (KLONOPIN) 0.5 MG tablet Take 1 tablet (0.5 mg total) by mouth 3 (three) times daily as needed for anxiety. 60 tablet 5  . esomeprazole (NEXIUM) 40 MG capsule Take 1 capsule (40 mg total) by mouth daily before breakfast.  90 capsule 4  . HUMIRA PEN 40 MG/0.4ML PNKT Inject 40 mg into the muscle every 14 (fourteen) days.   1  . hydrocortisone 2.5 % ointment     . lamoTRIgine (LAMICTAL) 200 MG tablet Take 1 tablet (200 mg total) by mouth daily. 90 tablet 1  . methylphenidate (RITALIN) 10 MG tablet TAKE 1 TABLET 3 TIMES DAILY WITH MEALS. 90 tablet 0  . METHYLPHENIDATE 36 MG PO CR tablet TAKE 1 TABLET EACH DAY. 30 tablet 0   No current facility-administered medications for this visit.    Allergies  Allergen Reactions  . Nsaids Other (See Comments)    Constipation   Subjective: Patient presents for session on time.  He shared progress.  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  He shared that he pleasant Thanksgiving holiday with family and looks forward to the next few weeks as he has several events planned, to attend some music concerts which is one of his longstanding outlets for enjoyment.  Reports that he has gained weight, some in part due to the change in medication however, also admits that he has not been working out and some of his labor with remodeling has been less intensive physically.  As a way to cope and care for himself he plans to engage in some exercise and has started by working out through stretching with instructors but plans to integrate some yoga in his regimen.  Progress has been made with his improved communication  with his parents, feels their relationship is moving toward improving.  Ways he has been effective at this was identified and processed.  Interventions: CBT, supportive therapy, problem solving  Diagnoses:    ICD-10-CM   1. Bipolar affective disorder, rapid cycling ([place])  F31.9     Plan: Patient is to use CBT, mindfulness and coping skills to help manage decrease symptoms associated with their diagnosis.    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  Continue to take steps toward effective communication in his relationship with is parents to keep stress low.    Long-term goal:   Reduce overall level, frequency, and intensity of the feelings of depression, anxiety evidenced by  decreased irritability, negative self talk, and improved functioning up to 80% of the time as reported by patient for at least 3 consecutive months.    Short-term goal:  Verbally express understanding of the relationship between feelings of depression, anxiety and their impact on thinking patterns and behaviors. Verbalize an understanding of the role that distorted thinking plays in creating fears, excessive worry, and ruminations. 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       Patient to report any concerns with his medications to his prescribing provider as needed.  Progress: progressing   Anson [place], [facility]

## 2020-06-29 NOTE — Telephone Encounter (Signed)
Apt 07/12/20

## 2020-07-06 DIAGNOSIS — M9907 Segmental and somatic dysfunction of upper extremity: Secondary | ICD-10-CM | POA: Diagnosis not present

## 2020-07-06 DIAGNOSIS — M9903 Segmental and somatic dysfunction of lumbar region: Secondary | ICD-10-CM | POA: Diagnosis not present

## 2020-07-06 DIAGNOSIS — M9905 Segmental and somatic dysfunction of pelvic region: Secondary | ICD-10-CM | POA: Diagnosis not present

## 2020-07-06 DIAGNOSIS — M9902 Segmental and somatic dysfunction of thoracic region: Secondary | ICD-10-CM | POA: Diagnosis not present

## 2020-07-09 DIAGNOSIS — M9905 Segmental and somatic dysfunction of pelvic region: Secondary | ICD-10-CM | POA: Diagnosis not present

## 2020-07-09 DIAGNOSIS — M9907 Segmental and somatic dysfunction of upper extremity: Secondary | ICD-10-CM | POA: Diagnosis not present

## 2020-07-09 DIAGNOSIS — M9903 Segmental and somatic dysfunction of lumbar region: Secondary | ICD-10-CM | POA: Diagnosis not present

## 2020-07-09 DIAGNOSIS — M9902 Segmental and somatic dysfunction of thoracic region: Secondary | ICD-10-CM | POA: Diagnosis not present

## 2020-07-11 ENCOUNTER — Encounter: Payer: Self-pay | Admitting: Internal Medicine

## 2020-07-11 ENCOUNTER — Ambulatory Visit (INDEPENDENT_AMBULATORY_CARE_PROVIDER_SITE_OTHER): Payer: BC Managed Care – PPO | Admitting: Internal Medicine

## 2020-07-11 ENCOUNTER — Other Ambulatory Visit: Payer: Self-pay

## 2020-07-11 VITALS — BP 120/88 | HR 86 | Temp 98.6°F | Resp 15 | Ht 68.0 in | Wt 198.0 lb

## 2020-07-11 DIAGNOSIS — M545 Low back pain, unspecified: Secondary | ICD-10-CM | POA: Diagnosis not present

## 2020-07-11 DIAGNOSIS — M9902 Segmental and somatic dysfunction of thoracic region: Secondary | ICD-10-CM | POA: Diagnosis not present

## 2020-07-11 DIAGNOSIS — M9907 Segmental and somatic dysfunction of upper extremity: Secondary | ICD-10-CM | POA: Diagnosis not present

## 2020-07-11 DIAGNOSIS — M9903 Segmental and somatic dysfunction of lumbar region: Secondary | ICD-10-CM | POA: Diagnosis not present

## 2020-07-11 DIAGNOSIS — M9905 Segmental and somatic dysfunction of pelvic region: Secondary | ICD-10-CM | POA: Diagnosis not present

## 2020-07-11 MED ORDER — CELECOXIB 200 MG PO CAPS: ORAL_CAPSULE | ORAL | 0 refills | Status: DC

## 2020-07-11 MED ORDER — KETOROLAC TROMETHAMINE 30 MG/ML IJ SOLN
30.0000 mg | Freq: Once | INTRAMUSCULAR | Status: AC
Start: 2020-07-11 — End: 2020-07-11
  Administered 2020-07-11: 30 mg via INTRAVENOUS

## 2020-07-11 MED ORDER — METHYLPREDNISOLONE ACETATE 40 MG/ML IJ SUSP
40.0000 mg | Freq: Once | INTRAMUSCULAR | Status: AC
  Administered 2020-07-11: 40 mg via INTRAMUSCULAR

## 2020-07-11 NOTE — Progress Notes (Signed)
   Subjective:   Patient ID: Francisco Morgan, male    DOB: Sep 19, 1975, 44 y.o.   MRN: [id number]  HPI The patient is a 44 YO man coming in for concerns about low back pain. Started Saturday with a sudden lifting and he felt a pull in his back. Overall it is not improving much. Having pain in the low left back and goes into the left hip. Does have concurrent psoriatic arthritis and on humira. Not overdue for that. Takes celebrex BID and still taking this without significant relief for the pain. Using heating pads on the area with some relief. Does chiropractor and stretching which helps some. Denies numbness or weakness in the legs. Denies radiation of the pain to the legs. More pain with bending, twisting, lying.   Review of Systems  Constitutional: Positive for activity change. Negative for appetite change, fatigue, fever and unexpected weight change.  Respiratory: Negative.   Cardiovascular: Negative.   Musculoskeletal: Positive for back pain and myalgias. Negative for arthralgias.  Skin: Negative.   Neurological: Negative for syncope, weakness and numbness.    Objective:  Physical Exam Constitutional:      Appearance: He is well-developed and well-nourished.  HENT:     Head: Normocephalic and atraumatic.  Eyes:     Extraocular Movements: EOM normal.  Cardiovascular:     Rate and Rhythm: Normal rate and regular rhythm.  Pulmonary:     Effort: Pulmonary effort is normal. No respiratory distress.     Breath sounds: Normal breath sounds. No wheezing or rales.  Abdominal:     General: Bowel sounds are normal. There is no distension.     Palpations: Abdomen is soft.     Tenderness: There is no abdominal tenderness. There is no rebound.  Musculoskeletal:        General: Tenderness present. No edema.     Cervical back: Normal range of motion.     Comments: Tenderness paraspinal left lumbar region. No tenderness over lumbar spine.  Skin:    General: Skin is warm and dry.   Neurological:     Mental Status: He is alert and oriented to person, place, and time.     Coordination: Coordination normal.  Psychiatric:        Mood and Affect: Mood and affect normal.     Vitals:   07/11/20 0851  BP: 120/88  Pulse: 86  Resp: 15  Temp: 98.6 F (37 C)  TempSrc: Oral  SpO2: 99%  Weight: 198 lb (89.8 kg)  Height: 5\' 8"  (1.727 m)    This visit occurred during the SARS-CoV-2 public health emergency.  Safety protocols were in place, including screening questions prior to the visit, additional usage of staff PPE, and extensive cleaning of exam room while observing appropriate contact time as indicated for disinfecting solutions.   Assessment & Plan:  Depo-medrol 40 mg IM and Toradol 30 mg IM given at visit

## 2020-07-11 NOTE — Assessment & Plan Note (Signed)
Given depo-medrol 40 mg IM and toradol 30 mg IM today for treatment. Refilled celebrex. Advised this appears to be muscle sprain/strain and advised stretching and continuing to stay active. Discussed safe lifting techniques.

## 2020-07-11 NOTE — Patient Instructions (Signed)
We have given you the cortisone shot today and the toradol for pain. The cortisone takes 1-2 days to kick in and the toradol will work today.

## 2020-07-12 ENCOUNTER — Ambulatory Visit (INDEPENDENT_AMBULATORY_CARE_PROVIDER_SITE_OTHER): Payer: BC Managed Care – PPO | Admitting: Mental Health

## 2020-07-12 DIAGNOSIS — F319 Bipolar disorder, unspecified: Secondary | ICD-10-CM

## 2020-07-12 NOTE — Progress Notes (Signed)
Psychotherapy Note  Name: Francisco Morgan Date: 07/12/20 MRN: [id number] DOB: [birth year] PCP: [name], MD  Time spent: 53 minutes  Treatment: Individual therapy  Mental Status Exam: Appearance:   casual  Behavior:  appropriate  Motor:  WNL  Speech/Language:   Clear, coherent  Affect:  Full range  Mood:  euthymic  Thought process:  normal  Thought content:   WNL  Sensory/Perceptual disturbances:   WNL  Orientation:  x4  Attention:  Good  Concentration:  Good  Memory:  WNL  Fund of knowledge:   WNL  Insight:   good  Judgment:   good  Impulse Control:  good   Reported Symptoms:  Decreased sleep intermittently, some anxiety and irritability, ruminations  Risk Assessment: Danger to Self:  No Self-injurious Behavior: No Danger to Others: No Duty to Warn:no Physical Aggression / Violence:No  Access to Firearms a concern: No  Gang Involvement:No  Patient / guardian was educated about steps to take if suicide or homicide risk level increases between visits: yes While future psychiatric events cannot be accurately predicted, the patient does not currently require acute inpatient psychiatric care and does not currently meet [facility] involuntary commitment criteria.  Medications: Current Outpatient Medications  Medication Sig Dispense Refill  . b complex vitamins tablet Take 1 tablet by mouth daily. 100 tablet 3  . cariprazine (VRAYLAR) capsule Take 1 capsule (3 mg total) by mouth daily. 90 capsule 0  . celecoxib (CELEBREX) 200 MG capsule TAKE (1) CAPSULE TWICE DAILY. 180 capsule 0  . Cholecalciferol (VITAMIN D) 2000 units CAPS 1 daily 100 capsule 3  . clonazePAM (KLONOPIN) 0.5 MG tablet Take 1 tablet (0.5 mg total) by mouth 3 (three) times daily as needed for anxiety. 60 tablet 5  . esomeprazole (NEXIUM) 40 MG capsule Take 1 capsule (40 mg total) by mouth daily before breakfast. 90 capsule 4  . HUMIRA PEN 40 MG/0.4ML PNKT Inject 40 mg  into the muscle every 14 (fourteen) days.   1  . hydrocortisone 2.5 % ointment     . lamoTRIgine (LAMICTAL) 200 MG tablet Take 1 tablet (200 mg total) by mouth daily. 90 tablet 1  . methylphenidate (RITALIN) 10 MG tablet TAKE 1 TABLET 3 TIMES DAILY WITH MEALS. 90 tablet 0  . METHYLPHENIDATE 36 MG PO CR tablet TAKE 1 TABLET EACH DAY. 30 tablet 0   No current facility-administered medications for this visit.    Allergies  Allergen Reactions  . Nsaids Other (See Comments)    Constipation   Subjective:  Patient presents for session sharing progress.  He stated that he continues to make significant progress on his parents home remodeling project.  Went on to share some recent communications with his mother, the ways he feels he is effectively setting boundaries as needed especially when she becomes emotionally distressed in their discussions.  Patient shared how they have had less verbal conflicts as result and he identifies the ways he sets the boundaries specifically.  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  Reports that he continues to gain some weight, in part he feels due to his medication as this is a potential side effect.  He identified the need to continue his workout regimen to address this as well as an outlet for stress reduction.  Assisted him in identifying his thoughts and perceptions about affective changes he is made with his communication and relationship as well as meeting interpersonal goals  toward employment and income.  Interventions: CBT, supportive therapy, problem solving  Diagnoses:    ICD-10-CM   1. Bipolar affective disorder, rapid cycling ([place])  F31.9     Plan: Patient is to use CBT, mindfulness and coping skills to help manage decrease symptoms associated with their diagnosis.    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  Continue to take steps toward  effective communication in his relationship with is parents to keep stress low.    Long-term goal:   Reduce overall level, frequency, and intensity of the feelings of depression, anxiety evidenced by  decreased irritability, negative self talk, and improved functioning up to 80% of the time as reported by patient for at least 3 consecutive months.    Short-term goal:  Verbally express understanding of the relationship between feelings of depression, anxiety and their impact on thinking patterns and behaviors. Verbalize an understanding of the role that distorted thinking plays in creating fears, excessive worry, and ruminations. 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       Patient to report any concerns with his medications to his prescribing provider as needed.  Progress: progressing   Anson [place], [facility]

## 2020-07-13 DIAGNOSIS — M9907 Segmental and somatic dysfunction of upper extremity: Secondary | ICD-10-CM | POA: Diagnosis not present

## 2020-07-13 DIAGNOSIS — M9902 Segmental and somatic dysfunction of thoracic region: Secondary | ICD-10-CM | POA: Diagnosis not present

## 2020-07-13 DIAGNOSIS — M9903 Segmental and somatic dysfunction of lumbar region: Secondary | ICD-10-CM | POA: Diagnosis not present

## 2020-07-13 DIAGNOSIS — M9905 Segmental and somatic dysfunction of pelvic region: Secondary | ICD-10-CM | POA: Diagnosis not present

## 2020-07-16 DIAGNOSIS — M9902 Segmental and somatic dysfunction of thoracic region: Secondary | ICD-10-CM | POA: Diagnosis not present

## 2020-07-16 DIAGNOSIS — M9907 Segmental and somatic dysfunction of upper extremity: Secondary | ICD-10-CM | POA: Diagnosis not present

## 2020-07-16 DIAGNOSIS — M9905 Segmental and somatic dysfunction of pelvic region: Secondary | ICD-10-CM | POA: Diagnosis not present

## 2020-07-16 DIAGNOSIS — M9903 Segmental and somatic dysfunction of lumbar region: Secondary | ICD-10-CM | POA: Diagnosis not present

## 2020-07-18 DIAGNOSIS — M9902 Segmental and somatic dysfunction of thoracic region: Secondary | ICD-10-CM | POA: Diagnosis not present

## 2020-07-18 DIAGNOSIS — M9905 Segmental and somatic dysfunction of pelvic region: Secondary | ICD-10-CM | POA: Diagnosis not present

## 2020-07-18 DIAGNOSIS — M9903 Segmental and somatic dysfunction of lumbar region: Secondary | ICD-10-CM | POA: Diagnosis not present

## 2020-07-18 DIAGNOSIS — M9907 Segmental and somatic dysfunction of upper extremity: Secondary | ICD-10-CM | POA: Diagnosis not present

## 2020-07-23 DIAGNOSIS — M9905 Segmental and somatic dysfunction of pelvic region: Secondary | ICD-10-CM | POA: Diagnosis not present

## 2020-07-23 DIAGNOSIS — M9903 Segmental and somatic dysfunction of lumbar region: Secondary | ICD-10-CM | POA: Diagnosis not present

## 2020-07-23 DIAGNOSIS — M9902 Segmental and somatic dysfunction of thoracic region: Secondary | ICD-10-CM | POA: Diagnosis not present

## 2020-07-23 DIAGNOSIS — M9907 Segmental and somatic dysfunction of upper extremity: Secondary | ICD-10-CM | POA: Diagnosis not present

## 2020-07-25 ENCOUNTER — Ambulatory Visit: Payer: BC Managed Care – PPO | Admitting: Mental Health

## 2020-07-30 ENCOUNTER — Other Ambulatory Visit: Payer: Self-pay | Admitting: Psychiatry

## 2020-07-30 DIAGNOSIS — F902 Attention-deficit hyperactivity disorder, combined type: Secondary | ICD-10-CM

## 2020-07-30 NOTE — Telephone Encounter (Signed)
[facility] called because you sent in refills for Francisco Morgan Ritalin and Concerta but they had requested the wrong quantity for both prescriptions.  Please send in corrected prescription with # 90 for the Ritalin 10mg , #30 for the Concerta 36mg 

## 2020-08-01 DIAGNOSIS — M9902 Segmental and somatic dysfunction of thoracic region: Secondary | ICD-10-CM | POA: Diagnosis not present

## 2020-08-01 DIAGNOSIS — M9903 Segmental and somatic dysfunction of lumbar region: Secondary | ICD-10-CM | POA: Diagnosis not present

## 2020-08-01 DIAGNOSIS — M9905 Segmental and somatic dysfunction of pelvic region: Secondary | ICD-10-CM | POA: Diagnosis not present

## 2020-08-01 DIAGNOSIS — M9907 Segmental and somatic dysfunction of upper extremity: Secondary | ICD-10-CM | POA: Diagnosis not present

## 2020-08-09 ENCOUNTER — Other Ambulatory Visit: Payer: Self-pay

## 2020-08-09 ENCOUNTER — Ambulatory Visit (INDEPENDENT_AMBULATORY_CARE_PROVIDER_SITE_OTHER): Payer: BC Managed Care – PPO | Admitting: Psychiatry

## 2020-08-09 ENCOUNTER — Encounter: Payer: Self-pay | Admitting: Psychiatry

## 2020-08-09 ENCOUNTER — Ambulatory Visit (INDEPENDENT_AMBULATORY_CARE_PROVIDER_SITE_OTHER): Payer: BC Managed Care – PPO | Admitting: Mental Health

## 2020-08-09 DIAGNOSIS — F4310 Post-traumatic stress disorder, unspecified: Secondary | ICD-10-CM | POA: Diagnosis not present

## 2020-08-09 DIAGNOSIS — M9905 Segmental and somatic dysfunction of pelvic region: Secondary | ICD-10-CM | POA: Diagnosis not present

## 2020-08-09 DIAGNOSIS — M9903 Segmental and somatic dysfunction of lumbar region: Secondary | ICD-10-CM | POA: Diagnosis not present

## 2020-08-09 DIAGNOSIS — F319 Bipolar disorder, unspecified: Secondary | ICD-10-CM | POA: Diagnosis not present

## 2020-08-09 DIAGNOSIS — M9902 Segmental and somatic dysfunction of thoracic region: Secondary | ICD-10-CM | POA: Diagnosis not present

## 2020-08-09 DIAGNOSIS — R635 Abnormal weight gain: Secondary | ICD-10-CM

## 2020-08-09 DIAGNOSIS — F902 Attention-deficit hyperactivity disorder, combined type: Secondary | ICD-10-CM | POA: Diagnosis not present

## 2020-08-09 DIAGNOSIS — M9907 Segmental and somatic dysfunction of upper extremity: Secondary | ICD-10-CM | POA: Diagnosis not present

## 2020-08-09 MED ORDER — EQUETRO 300 MG PO CP12: ORAL_CAPSULE | ORAL | 0 refills | Status: DC

## 2020-08-09 NOTE — Patient Instructions (Signed)
Stop Vraylar  Start Equetro 100 mg capsules 2 at night for 3 nights, then 3 at night for 3 nights, then 4 at night for 3 nights, then 6 at night for 3 nights, then 1 in Am and 6 at night for 3 nights, then 2 in am and 6 at night for 3 nights, then 300 mg in AM and 600 mg at night.

## 2020-08-09 NOTE — Progress Notes (Signed)
Psychotherapy Note  Name: Francisco Morgan Date: 08/09/20 MRN: [id number] DOB: [birth year] PCP: Francisco Anger, MD  Time spent: 53 minutes  Treatment: Individual therapy  Mental Status Exam: Appearance:   casual  Behavior:  appropriate  Motor:  WNL  Speech/Language:    Clear, coherent  Affect:  Full range  Mood:  euthymic  Thought process:  normal  Thought content:   WNL  Sensory/Perceptual disturbances:   WNL  Orientation:  x4  Attention:  Good  Concentration:  Good  Memory:  WNL  Fund of knowledge:   WNL  Insight:   good  Judgment:   good  Impulse Control:  good   Reported Symptoms:  Decreased sleep intermittently, some anxiety and irritability, ruminations  Risk Assessment: Danger to Self:  No Self-injurious Behavior: No Danger to Others: No Duty to Warn:no Physical Aggression / Violence:No  Access to Firearms a concern: No  Gang Involvement:No  Patient / guardian was educated about steps to take if suicide or homicide risk level increases between visits: yes While future psychiatric events cannot be accurately predicted, the patient does not currently require acute inpatient psychiatric care and does not currently meet [facility] involuntary commitment criteria.  Medications: Current Outpatient Medications  Medication Sig Dispense Refill  . b complex vitamins tablet Take 1 tablet by mouth daily. 100 tablet 3  . Carbamazepine (EQUETRO) 300 MG CP12 1 in the AM and 3 at night 60 capsule 0  . celecoxib (CELEBREX) 200 MG capsule TAKE (1) CAPSULE TWICE DAILY. 180 capsule 0  . Cholecalciferol (VITAMIN D) 2000 units CAPS 1 daily 100 capsule 3  . clonazePAM (KLONOPIN) 0.5 MG tablet Take 1 tablet (0.5 mg total) by mouth 3 (three) times daily as needed for anxiety. 60 tablet 5  . esomeprazole (NEXIUM) 40 MG capsule Take 1 capsule (40 mg total) by mouth daily before breakfast. 90 capsule 4  . HUMIRA PEN 40 MG/0.4ML PNKT Inject 40 mg into the  muscle every 14 (fourteen) days.   1  . hydrocortisone 2.5 % ointment     . lamoTRIgine (LAMICTAL) 200 MG tablet Take 1 tablet (200 mg total) by mouth daily. 90 tablet 1  . methylphenidate (RITALIN) 10 MG tablet TAKE 1 TABLET 3 TIMES DAILY WITH MEALS. 90 tablet 0  . METHYLPHENIDATE 36 MG PO CR tablet TAKE 1 TABLET EACH DAY. 30 tablet 0   No current facility-administered medications for this visit.    Allergies  Allergen Reactions  . Nsaids Other (See Comments)    Constipation   Subjective:  Patient presents for session sharing progress.  He stated that he has had a challenging few weeks, the trip he was planning on going on around [place] got canceled due to [place].  He was really looking forward to this trip as he enjoys attending social events, sometimes music festivals.  He is considering going on another potential trip in March but plans to be cautious as he lost some money due to the cancellation of the [place] trip.  He went on to share how he feels his relationship with his parents is improving, even getting compliments from his mother regarding his work on the remodeling of the home.  He continues to cultivate real estate leaves, plans to also continue to work with his parents on some coming home remodeling projects in the year following the completion of his current project.  He shared how he attended his recent medication management visit where he is transitioning to a  new medication due to side effects from the previous one.  Through guided discovery, he identified having a sense of contentment and validation based on how he has been treated by his parents recently.  Assisted him throughout in identifying processing feelings related.  Interventions: CBT, supportive therapy, problem solving  Diagnoses:    ICD-10-CM   1. Bipolar affective disorder, rapid cycling ([place])  F31.9     Plan: Patient is to use CBT, mindfulness and coping skills to help manage decrease symptoms  associated with their diagnosis.    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  Continue to take steps toward effective communication in his relationship with is parents to keep stress low.    Long-term goal:   Reduce overall level, frequency, and intensity of the feelings of depression, anxiety evidenced by  decreased irritability, negative self talk, and improved functioning up to 80% of the time as reported by patient for at least 3 consecutive months.    Short-term goal:  Verbally express understanding of the relationship between feelings of depression, anxiety and their impact on thinking patterns and behaviors. Verbalize an understanding of the role that distorted thinking plays in creating fears, excessive worry, and ruminations. 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       Patient to report any concerns with his medications to his prescribing provider as needed.  Progress: progressing   Anson [place], [facility]
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
## 2020-08-17 DIAGNOSIS — M9905 Segmental and somatic dysfunction of pelvic region: Secondary | ICD-10-CM | POA: Diagnosis not present

## 2020-08-17 DIAGNOSIS — M9902 Segmental and somatic dysfunction of thoracic region: Secondary | ICD-10-CM | POA: Diagnosis not present

## 2020-08-17 DIAGNOSIS — M9907 Segmental and somatic dysfunction of upper extremity: Secondary | ICD-10-CM | POA: Diagnosis not present

## 2020-08-17 DIAGNOSIS — M9903 Segmental and somatic dysfunction of lumbar region: Secondary | ICD-10-CM | POA: Diagnosis not present

## 2020-08-23 ENCOUNTER — Other Ambulatory Visit: Payer: Self-pay

## 2020-08-23 ENCOUNTER — Ambulatory Visit (INDEPENDENT_AMBULATORY_CARE_PROVIDER_SITE_OTHER): Payer: BC Managed Care – PPO | Admitting: Mental Health

## 2020-08-23 DIAGNOSIS — F319 Bipolar disorder, unspecified: Secondary | ICD-10-CM | POA: Diagnosis not present

## 2020-08-23 DIAGNOSIS — M9903 Segmental and somatic dysfunction of lumbar region: Secondary | ICD-10-CM | POA: Diagnosis not present

## 2020-08-23 DIAGNOSIS — M9905 Segmental and somatic dysfunction of pelvic region: Secondary | ICD-10-CM | POA: Diagnosis not present

## 2020-08-23 DIAGNOSIS — M9902 Segmental and somatic dysfunction of thoracic region: Secondary | ICD-10-CM | POA: Diagnosis not present

## 2020-08-23 DIAGNOSIS — M9907 Segmental and somatic dysfunction of upper extremity: Secondary | ICD-10-CM | POA: Diagnosis not present

## 2020-08-23 NOTE — Progress Notes (Signed)
Psychotherapy Note  Name: Francisco Morgan Date: 08/23/20 MRN: [id number] DOB: 1976/04/23 PCP: [name], MD  Time spent: 53 minutes  Treatment: Individual therapy  Mental Status Exam: Appearance:   casual  Behavior:  appropriate  Motor:  WNL  Speech/Language:    Clear, coherent  Affect:  Full range  Mood:  euthymic  Thought process:  normal  Thought content:   WNL  Sensory/Perceptual disturbances:   WNL  Orientation:  x4  Attention:  Good  Concentration:  Good  Memory:  WNL  Fund of knowledge:   WNL  Insight:   good  Judgment:   good  Impulse Control:  good   Reported Symptoms:  Decreased sleep intermittently, some anxiety and irritability, ruminations  Risk Assessment: Danger to Self:  No Self-injurious Behavior: No Danger to Others: No Duty to Warn:no Physical Aggression / Violence:No  Access to Firearms a concern: No  Gang Involvement:No  Patient / guardian was educated about steps to take if suicide or homicide risk level increases between visits: yes While future psychiatric events cannot be accurately predicted, the patient does not currently require acute inpatient psychiatric care and does not currently meet [facility] involuntary commitment criteria.  Medications: Current Outpatient Medications  Medication Sig Dispense Refill  . b complex vitamins tablet Take 1 tablet by mouth daily. 100 tablet 3  . Carbamazepine (EQUETRO) 300 MG CP12 1 in the AM and 2 at night 90 capsule 0  . celecoxib (CELEBREX) 200 MG capsule TAKE (1) CAPSULE TWICE DAILY. 180 capsule 0  . Cholecalciferol (VITAMIN D) 2000 units CAPS 1 daily 100 capsule 3  . clonazePAM (KLONOPIN) 0.5 MG tablet Take 1 tablet (0.5 mg total) by mouth 3 (three) times daily as needed for anxiety. 60 tablet 5  . esomeprazole (NEXIUM) 40 MG capsule Take 1 capsule (40 mg total) by mouth daily before breakfast. 90 capsule 4  . HUMIRA PEN 40 MG/0.4ML PNKT Inject 40 mg into the  muscle every 14 (fourteen) days.   1  . hydrocortisone 2.5 % ointment     . lamoTRIgine (LAMICTAL) 200 MG tablet Take 1 tablet (200 mg total) by mouth daily. 90 tablet 1  . methylphenidate (RITALIN) 10 MG tablet TAKE 1 TABLET 3 TIMES DAILY WITH MEALS. 90 tablet 0  . METHYLPHENIDATE 36 MG PO CR tablet TAKE 1 TABLET EACH DAY. 30 tablet 0   No current facility-administered medications for this visit.    Allergies  Allergen Reactions  . Nsaids Other (See Comments)    Constipation   Subjective:  Patient presents for session sharing progress.  He shared some positive changes, how he and his parents are getting along better, sharing some specific conversations he has had with his mother.  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  He identified how he feels that she had some uncertainty in his abilities to complete the various home repair tasks versus just getting upset about him not finishing tasks in a more timely and cost effective manner.  Patient seems to be more content as a result of this understanding, appearing to feel less pressured.  He shared how they were able to attend a concert recently and the experience was enjoyable.  He looks forward to going to another music venue in about a week, this is one of his outlets, attending these types of shows to destress.  Facilitated his identifying needs as well as self supportive thoughts to ground himself to keep his anxiety and stress low.   Interventions: CBT, supportive therapy, problem solving  Diagnoses:    ICD-10-CM   1. Bipolar affective disorder, rapid cycling ([place])  F31.9     Plan: Patient is to use CBT, mindfulness and coping skills to help manage decrease symptoms associated with their diagnosis.    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   Continue to take steps toward effective communication in his relationship with is parents to keep stress low.    Long-term goal:   Reduce overall level, frequency, and intensity of the feelings of depression, anxiety evidenced by  decreased irritability, negative self talk, and improved functioning up to 80% of the time as reported by patient for at least 3 consecutive months.    Short-term goal:  Verbally express understanding of the relationship between feelings of depression, anxiety and their impact on thinking patterns and behaviors. Verbalize an understanding of the role that distorted thinking plays in creating fears, excessive worry, and ruminations. 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       Patient to report any concerns with his medications to his prescribing provider as needed.  Progress: progressing   Anson [place], [facility]

## 2020-08-29 ENCOUNTER — Other Ambulatory Visit: Payer: Self-pay | Admitting: Psychiatry

## 2020-08-29 DIAGNOSIS — F902 Attention-deficit hyperactivity disorder, combined type: Secondary | ICD-10-CM

## 2020-08-29 DIAGNOSIS — F4310 Post-traumatic stress disorder, unspecified: Secondary | ICD-10-CM

## 2020-08-29 DIAGNOSIS — F319 Bipolar disorder, unspecified: Secondary | ICD-10-CM

## 2020-08-30 ENCOUNTER — Other Ambulatory Visit: Payer: Self-pay | Admitting: Psychiatry

## 2020-08-30 ENCOUNTER — Telehealth: Payer: Self-pay | Admitting: Psychiatry

## 2020-08-30 NOTE — Telephone Encounter (Signed)
Prior Authorization sent to [facility]

## 2020-08-30 NOTE — Telephone Encounter (Signed)
Francisco Morgan will require a PA from [employer].  RX is at [facility].  He has [place]  UE#[id number] Pt has been given samples at office today.

## 2020-08-30 NOTE — Telephone Encounter (Signed)
This med was stopped

## 2020-09-03 ENCOUNTER — Other Ambulatory Visit: Payer: Self-pay | Admitting: Psychiatry

## 2020-09-03 ENCOUNTER — Telehealth: Payer: Self-pay | Admitting: Psychiatry

## 2020-09-03 MED ORDER — CARBAMAZEPINE ER 300 MG PO CP12: ORAL_CAPSULE | ORAL | 1 refills | Status: DC

## 2020-09-03 NOTE — Telephone Encounter (Signed)
Dr. Clovis Pu is changing medication he is aware of denial.

## 2020-09-03 NOTE — Telephone Encounter (Signed)
RX sent

## 2020-09-03 NOTE — Telephone Encounter (Signed)
Per PT , Leonides Grills was denied. What does he need to do? He cannot stay on it at $500 an RX. Will need to know how to taper?

## 2020-09-03 NOTE — Telephone Encounter (Signed)
Prior authorization response from [facility] received for EQUETRO with a denial due to patient needing to try other formularies first such as generic carbamazepine ER capsule, carbamazepine ER tablet.

## 2020-09-06 ENCOUNTER — Other Ambulatory Visit: Payer: Self-pay

## 2020-09-06 ENCOUNTER — Ambulatory Visit (INDEPENDENT_AMBULATORY_CARE_PROVIDER_SITE_OTHER): Payer: BC Managed Care – PPO | Admitting: Mental Health

## 2020-09-06 DIAGNOSIS — M9902 Segmental and somatic dysfunction of thoracic region: Secondary | ICD-10-CM | POA: Diagnosis not present

## 2020-09-06 DIAGNOSIS — M9903 Segmental and somatic dysfunction of lumbar region: Secondary | ICD-10-CM | POA: Diagnosis not present

## 2020-09-06 DIAGNOSIS — F319 Bipolar disorder, unspecified: Secondary | ICD-10-CM | POA: Diagnosis not present

## 2020-09-06 DIAGNOSIS — M9905 Segmental and somatic dysfunction of pelvic region: Secondary | ICD-10-CM | POA: Diagnosis not present

## 2020-09-06 DIAGNOSIS — M9907 Segmental and somatic dysfunction of upper extremity: Secondary | ICD-10-CM | POA: Diagnosis not present

## 2020-09-06 NOTE — Progress Notes (Signed)
Psychotherapy Note  Name: Francisco Morgan Date: 09/06/20 MRN: [id number] DOB: Jan 30, 1976 PCP: [name], MD  Time spent: 53 minutes  Treatment: Individual therapy  Mental Status Exam: Appearance:   casual  Behavior:  appropriate  Motor:  WNL  Speech/Language:    Clear, coherent  Affect:  Full range  Mood:  euthymic  Thought process:  normal  Thought content:   WNL  Sensory/Perceptual disturbances:   WNL  Orientation:  x4  Attention:  Good  Concentration:  Good  Memory:  WNL  Fund of knowledge:   WNL  Insight:   good  Judgment:   good  Impulse Control:  good   Reported Symptoms:  Decreased sleep intermittently, some anxiety and irritability, ruminations  Risk Assessment: Danger to Self:  No Self-injurious Behavior: No Danger to Others: No Duty to Warn:no Physical Aggression / Violence:No  Access to Firearms a concern: No  Gang Involvement:No  Patient / guardian was educated about steps to take if suicide or homicide risk level increases between visits: yes While future psychiatric events cannot be accurately predicted, the patient does not currently require acute inpatient psychiatric care and does not currently meet [facility] involuntary commitment criteria.  Medications: Current Outpatient Medications  Medication Sig Dispense Refill  . carbamazepine (CARBATROL) 300 MG 12 hr capsule 1 at night for 1 week then 2 at night 60 capsule 1  . b complex vitamins tablet Take 1 tablet by mouth daily. 100 tablet 3  . celecoxib (CELEBREX) 200 MG capsule TAKE (1) CAPSULE TWICE DAILY. 180 capsule 0  . Cholecalciferol (VITAMIN D) 2000 units CAPS 1 daily 100 capsule 3  . clonazePAM (KLONOPIN) 0.5 MG tablet TAKE 1 TABLET THREE TIMES DAILY AS NEEDED FOR ANXIETY. 60 tablet 0  . esomeprazole (NEXIUM) 40 MG capsule Take 1 capsule (40 mg total) by mouth daily before breakfast. 90 capsule 4  . HUMIRA PEN 40 MG/0.4ML PNKT Inject 40 mg into the muscle  every 14 (fourteen) days.   1  . hydrocortisone 2.5 % ointment     . lamoTRIgine (LAMICTAL) 200 MG tablet Take 1 tablet (200 mg total) by mouth daily. 90 tablet 1  . methylphenidate (RITALIN) 10 MG tablet TAKE 1 TABLET 3 TIMES DAILY WITH MEALS. 90 tablet 0  . METHYLPHENIDATE 36 MG PO CR tablet TAKE 1 TABLET EACH DAY. 30 tablet 0   No current facility-administered medications for this visit.    Allergies  Allergen Reactions  . Nsaids Other (See Comments)    Constipation   Subjective:  Patient presents for session sharing progress.  He stated that he has had challenges with his medications due to cost.  He stated that one of the medications with which he felt could be helpful is expensive and at this point and plans to further discuss at his next medication management appointment about returning to one of his previous medications he felt was effective overall with some manageable side effects.  Provide support and understanding as he processed feelings related to the ongoing challenges of coping with his symptoms over the years, and trying various medications towards management.  He shared how albeit his parents are supportive, not feeling fully understood regarding challenges with which he has had to cope regarding his mental health issues over the years.  He shared the continued improvement in their overall communication specifically with his mother as they have some recent discussions about current progress on their property and other potential properties that they may take on his  projects in the future.  Facilitated his identifying self supportive thoughts, areas with which he feels he has made progress regarding his day-to-day functioning with being able to achieve tasks related to the remodeling, effective communication as well as setting boundaries in the relationship with his parents when needed.     Interventions: CBT, supportive therapy, problem solving  Diagnoses:    ICD-10-CM   1.  Bipolar affective disorder, rapid cycling ([place])  F31.9     Plan: Patient is to use CBT, mindfulness and coping skills to help manage decrease symptoms associated with their diagnosis.    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  Continue to take steps toward effective communication in his relationship with is parents to keep stress low.    Long-term goal:   Reduce overall level, frequency, and intensity of the feelings of depression, anxiety evidenced by  decreased irritability, negative self talk, and improved functioning up to 80% of the time as reported by patient for at least 3 consecutive months.    Short-term goal:  Verbally express understanding of the relationship between feelings of depression, anxiety and their impact on thinking patterns and behaviors. Verbalize an understanding of the role that distorted thinking plays in creating fears, excessive worry, and ruminations. 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       Patient to report any concerns with his medications to his prescribing provider as needed.  Progress: progressing   Anson [place], [facility]

## 2020-09-20 ENCOUNTER — Telehealth: Payer: Self-pay | Admitting: Psychiatry

## 2020-09-20 ENCOUNTER — Ambulatory Visit (INDEPENDENT_AMBULATORY_CARE_PROVIDER_SITE_OTHER): Payer: BC Managed Care – PPO | Admitting: Mental Health

## 2020-09-20 DIAGNOSIS — F319 Bipolar disorder, unspecified: Secondary | ICD-10-CM

## 2020-09-20 NOTE — Telephone Encounter (Signed)
I have been trying to reach him within the past hour with no luck.I will try to call again in the morning.

## 2020-09-20 NOTE — Telephone Encounter (Signed)
Please document the reason he wants to switch off Equetro.  Start Depakote ER 500 mg tablets 1 at night and reduce Equetro to 2 tablets at night for 5 days,  then increase Depakote to 2 tablets at night and reduce Equetro to 1 tablet daily for 5 days,  then increase Depakote to 3 each night and stop the [employer].  He may have less overall side effects with Depakote if he adds the supplement l-carnitine 1000 mg twice daily.  That is not necessary but if he has not tried it in the past it may reduce any side effects from the Depakote.

## 2020-09-20 NOTE — Telephone Encounter (Signed)
Pt requesting instructions on how to get off new med and back on depakote. Contact @ [phone] or mychart per pt

## 2020-09-20 NOTE — Progress Notes (Signed)
Psychotherapy Note  Name: Francisco Morgan Date: 09/20/20 MRN: [id number] DOB: Dec 08, 1975 PCP: [name], MD  Time spent: 54 minutes  Treatment: Individual therapy  Mental Status Exam: Appearance:   casual  Behavior:  appropriate  Motor:  WNL  Speech/Language:    Clear, coherent  Affect:  Full range  Mood:  euthymic  Thought process:  normal  Thought content:   WNL  Sensory/Perceptual disturbances:   WNL  Orientation:  x4  Attention:  Good  Concentration:  Good  Memory:  WNL  Fund of knowledge:   WNL  Insight:   good  Judgment:   good  Impulse Control:  good   Reported Symptoms:  Decreased sleep intermittently, some anxiety and irritability, ruminations  Risk Assessment: Danger to Self:  No Self-injurious Behavior: No Danger to Others: No Duty to Warn:no Physical Aggression / Violence:No  Access to Firearms a concern: No  Gang Involvement:No  Patient / guardian was educated about steps to take if suicide or homicide risk level increases between visits: yes While future psychiatric events cannot be accurately predicted, the patient does not currently require acute inpatient psychiatric care and does not currently meet [facility] involuntary commitment criteria.  Medications: Current Outpatient Medications  Medication Sig Dispense Refill  . carbamazepine (CARBATROL) 300 MG 12 hr capsule 1 at night for 1 week then 2 at night 60 capsule 1  . b complex vitamins tablet Take 1 tablet by mouth daily. 100 tablet 3  . celecoxib (CELEBREX) 200 MG capsule TAKE (1) CAPSULE TWICE DAILY. 180 capsule 0  . Cholecalciferol (VITAMIN D) 2000 units CAPS 1 daily 100 capsule 3  . clonazePAM (KLONOPIN) 0.5 MG tablet TAKE 1 TABLET THREE TIMES DAILY AS NEEDED FOR ANXIETY. 60 tablet 0  . esomeprazole (NEXIUM) 40 MG capsule Take 1 capsule (40 mg total) by mouth daily before breakfast. 90 capsule 4  . HUMIRA PEN 40 MG/0.4ML PNKT Inject 40 mg into the muscle  every 14 (fourteen) days.   1  . hydrocortisone 2.5 % ointment     . lamoTRIgine (LAMICTAL) 200 MG tablet Take 1 tablet (200 mg total) by mouth daily. 90 tablet 1  . methylphenidate (RITALIN) 10 MG tablet TAKE 1 TABLET 3 TIMES DAILY WITH MEALS. 90 tablet 0  . METHYLPHENIDATE 36 MG PO CR tablet TAKE 1 TABLET EACH DAY. 30 tablet 0   No current facility-administered medications for this visit.    Allergies  Allergen Reactions  . Nsaids Other (See Comments)    Constipation   Subjective:  Patient presents for session.  He shared how he continues to make progress on his parents remodeling project.  He also continues to try and be successful with his real estate efforts, however, the market continues to be challenging due to the current market.  He went on to share concerns regarding some recent world events, going on to shared some details.    He shared recent interactions with his parents, which continue to be stable, more productive communication.  Facilitated his identifying some specific ways he is trying to work on his communication and continued plans.      Interventions: CBT, supportive therapy, problem solving  Diagnoses:    ICD-10-CM   1. Bipolar affective disorder, rapid cycling ([place])  F31.9     Plan: Patient is to use CBT, mindfulness and coping skills to help manage decrease symptoms associated with their diagnosis.    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  Continue to take steps toward effective communication in his relationship with is parents to keep stress low.    Long-term goal:   Reduce overall level, frequency, and intensity of the feelings of depression, anxiety evidenced by  decreased irritability, negative self talk, and improved functioning up to 80% of the time as reported by patient for at least 3 consecutive months.    Short-term goal:  Verbally express understanding of the relationship between  feelings of depression, anxiety and their impact on thinking patterns and behaviors. Verbalize an understanding of the role that distorted thinking plays in creating fears, excessive worry, and ruminations. 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       Patient to report any concerns with his medications to his prescribing provider as needed.  Progress: progressing   Anson [place], [facility]

## 2020-09-21 ENCOUNTER — Telehealth: Payer: Self-pay | Admitting: Psychiatry

## 2020-09-21 ENCOUNTER — Other Ambulatory Visit: Payer: Self-pay | Admitting: Psychiatry

## 2020-09-21 DIAGNOSIS — F4310 Post-traumatic stress disorder, unspecified: Secondary | ICD-10-CM

## 2020-09-21 DIAGNOSIS — F902 Attention-deficit hyperactivity disorder, combined type: Secondary | ICD-10-CM

## 2020-09-21 DIAGNOSIS — F319 Bipolar disorder, unspecified: Secondary | ICD-10-CM

## 2020-09-21 MED ORDER — CLONAZEPAM 0.5 MG PO TABS
0.5000 mg | ORAL_TABLET | Freq: Three times a day (TID) | ORAL | 2 refills | Status: DC | PRN
Start: 2020-09-21 — End: 2021-02-05

## 2020-09-21 MED ORDER — METHYLPHENIDATE HCL 10 MG PO TABS: ORAL_TABLET | ORAL | 0 refills | Status: DC

## 2020-09-21 MED ORDER — DIVALPROEX SODIUM ER 500 MG PO TB24: ORAL_TABLET | ORAL | 2 refills | Status: DC

## 2020-09-21 MED ORDER — METHYLPHENIDATE HCL ER (OSM) 36 MG PO TBCR: 36.0000 mg | EXTENDED_RELEASE_TABLET | Freq: Every day | ORAL | 0 refills | Status: DC

## 2020-09-21 NOTE — Telephone Encounter (Signed)
Thank you.  You are correct that he is on the Carbatrol version of carbamazepine rather than the Equetro brand of carbamazepine.  He is still on 3 daily so give him the instructions for the change as documented from Carbatrol [name]) to Depakote.

## 2020-09-21 NOTE — Telephone Encounter (Signed)
sent 

## 2020-09-21 NOTE — Telephone Encounter (Signed)
Pt has been called and given instructions,he said he will also look into I-carnitine supplement.

## 2020-09-21 NOTE — Telephone Encounter (Signed)
Just a reminder from our 02/07 conversation and phone call, patient is not on Equetro, his prior authorization was denied which brought his cost to $500.00 so you changed his Rx to carbamazepine instead.

## 2020-09-21 NOTE — Telephone Encounter (Signed)
Pt called and needss a refill on his ritalin, concerta and klonopin. He has an appt 10/19/20

## 2020-10-04 ENCOUNTER — Ambulatory Visit: Payer: BC Managed Care – PPO | Admitting: Psychiatry

## 2020-10-04 ENCOUNTER — Ambulatory Visit (INDEPENDENT_AMBULATORY_CARE_PROVIDER_SITE_OTHER): Payer: BC Managed Care – PPO | Admitting: Mental Health

## 2020-10-04 ENCOUNTER — Other Ambulatory Visit: Payer: Self-pay

## 2020-10-04 DIAGNOSIS — F319 Bipolar disorder, unspecified: Secondary | ICD-10-CM

## 2020-10-04 NOTE — Progress Notes (Signed)
Psychotherapy Note  Name: Francisco Morgan Date: 10/04/20 MRN: [id number] DOB: 1976-01-18 PCP: [name], MD  Time spent: 53 minutes  Treatment: Individual therapy  Mental Status Exam: Appearance:   casual  Behavior:  appropriate  Motor:  WNL  Speech/Language:    Clear, coherent  Affect:  Full range  Mood:  euthymic  Thought process:  normal  Thought content:   WNL  Sensory/Perceptual disturbances:   WNL  Orientation:  x4  Attention:  Good  Concentration:  Good  Memory:  WNL  Fund of knowledge:   WNL  Insight:   good  Judgment:   good  Impulse Control:  good   Reported Symptoms:  Decreased sleep intermittently, some anxiety and irritability, ruminations  Risk Assessment: Danger to Self:  No Self-injurious Behavior: No Danger to Others: No Duty to Warn:no Physical Aggression / Violence:No  Access to Firearms a concern: No  Gang Involvement:No  Patient / guardian was educated about steps to take if suicide or homicide risk level increases between visits: yes While future psychiatric events cannot be accurately predicted, the patient does not currently require acute inpatient psychiatric care and does not currently meet [facility] involuntary commitment criteria.  Medications: Current Outpatient Medications  Medication Sig Dispense Refill  . b complex vitamins tablet Take 1 tablet by mouth daily. 100 tablet 3  . celecoxib (CELEBREX) 200 MG capsule TAKE (1) CAPSULE TWICE DAILY. 180 capsule 0  . Cholecalciferol (VITAMIN D) 2000 units CAPS 1 daily 100 capsule 3  . clonazePAM (KLONOPIN) 0.5 MG tablet Take 1 tablet (0.5 mg total) by mouth 3 (three) times daily as needed for anxiety. 60 tablet 2  . divalproex (DEPAKOTE ER) 500 MG 24 hr tablet 1 nightly for 5 nights, then 2 nightly for 5 nights, then 3 nightly 90 tablet 2  . esomeprazole (NEXIUM) 40 MG capsule Take 1 capsule (40 mg total) by mouth daily before breakfast. 90 capsule 4  .  HUMIRA PEN 40 MG/0.4ML PNKT Inject 40 mg into the muscle every 14 (fourteen) days.   1  . hydrocortisone 2.5 % ointment     . lamoTRIgine (LAMICTAL) 200 MG tablet Take 1 tablet (200 mg total) by mouth daily. 90 tablet 1  . methylphenidate (RITALIN) 10 MG tablet TAKE 1 TABLET 3 TIMES DAILY WITH MEALS. 90 tablet 0  . methylphenidate 36 MG PO CR tablet Take 1 tablet (36 mg total) by mouth daily. 30 tablet 0   No current facility-administered medications for this visit.    Allergies  Allergen Reactions  . Nsaids Other (See Comments)    Constipation   Subjective:  Patient presents for session in no distress sharing recent progress.  He stated that he and his parents continue to get along well overall, 1 recent issue with his mother where he had a set a boundary with how she was speaking to him.  Patient went on to share some details, more history related to their relationship as well as family history.  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  He shared how he feels he is making progress significantly with the home remodeling and also feels comfortable with the boundaries he continues to set as needed in the relationship with his mother specifically.  He identified areas with which he could be mindful of regarding  when interacting with her to keep stress manageable and feelings of frustration manageable by not engaging in arguments with her.    Interventions: CBT, supportive therapy, problem solving  Diagnoses:    ICD-10-CM   1. Bipolar affective disorder, rapid cycling ([place])  F31.9     Plan: Patient is to use CBT, mindfulness and coping skills to help manage decrease symptoms associated with their diagnosis.    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  Continue to take steps toward effective communication in his relationship with is parents to keep stress low.    Long-term goal:   Reduce overall level, frequency, and intensity of the feelings of depression, anxiety evidenced by  decreased irritability, negative self talk, and improved functioning up to 80% of the time as reported by patient for at least 3 consecutive months.    Short-term goal:  Verbally express understanding of the relationship between feelings of depression, anxiety and their impact on thinking patterns and behaviors. Verbalize an understanding of the role that distorted thinking plays in creating fears, excessive worry, and ruminations. 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       Patient to report any concerns with his medications to his prescribing provider as needed.  Progress: progressing   Anson [place], [facility]

## 2020-10-17 DIAGNOSIS — M9905 Segmental and somatic dysfunction of pelvic region: Secondary | ICD-10-CM | POA: Diagnosis not present

## 2020-10-17 DIAGNOSIS — M9902 Segmental and somatic dysfunction of thoracic region: Secondary | ICD-10-CM | POA: Diagnosis not present

## 2020-10-17 DIAGNOSIS — M9903 Segmental and somatic dysfunction of lumbar region: Secondary | ICD-10-CM | POA: Diagnosis not present

## 2020-10-17 DIAGNOSIS — M9907 Segmental and somatic dysfunction of upper extremity: Secondary | ICD-10-CM | POA: Diagnosis not present

## 2020-10-18 ENCOUNTER — Ambulatory Visit (INDEPENDENT_AMBULATORY_CARE_PROVIDER_SITE_OTHER): Payer: BC Managed Care – PPO | Admitting: Mental Health

## 2020-10-18 ENCOUNTER — Other Ambulatory Visit: Payer: Self-pay

## 2020-10-18 DIAGNOSIS — F319 Bipolar disorder, unspecified: Secondary | ICD-10-CM

## 2020-10-18 NOTE — Progress Notes (Signed)
Psychotherapy Note  Name: Francisco Morgan Date: 10/18/20 MRN: [id number] DOB: 1975-08-14 PCP: [name], MD  Time spent: 54 minutes  Treatment: Individual therapy  Mental Status Exam: Appearance:   casual  Behavior:  appropriate  Motor:  WNL  Speech/Language:    Clear, coherent  Affect:  Full range  Mood:  euthymic  Thought process:  normal  Thought content:   WNL  Sensory/Perceptual disturbances:   WNL  Orientation:  x4  Attention:  Good  Concentration:  Good  Memory:  WNL  Fund of knowledge:   WNL  Insight:   good  Judgment:   good  Impulse Control:  good   Reported Symptoms:  Decreased sleep intermittently, some anxiety and irritability, ruminations  Risk Assessment: Danger to Self:  No Self-injurious Behavior: No Danger to Others: No Duty to Warn:no Physical Aggression / Violence:No  Access to Firearms a concern: No  Gang Involvement:No  Patient / guardian was educated about steps to take if suicide or homicide risk level increases between visits: yes While future psychiatric events cannot be accurately predicted, the patient does not currently require acute inpatient psychiatric care and does not currently meet [facility] involuntary commitment criteria.  Medications: Current Outpatient Medications  Medication Sig Dispense Refill  . b complex vitamins tablet Take 1 tablet by mouth daily. 100 tablet 3  . celecoxib (CELEBREX) 200 MG capsule TAKE (1) CAPSULE TWICE DAILY. 180 capsule 0  . Cholecalciferol (VITAMIN D) 2000 units CAPS 1 daily 100 capsule 3  . clonazePAM (KLONOPIN) 0.5 MG tablet Take 1 tablet (0.5 mg total) by mouth 3 (three) times daily as needed for anxiety. 60 tablet 2  . divalproex (DEPAKOTE ER) 500 MG 24 hr tablet 1 nightly for 5 nights, then 2 nightly for 5 nights, then 3 nightly 90 tablet 2  . esomeprazole (NEXIUM) 40 MG capsule Take 1 capsule (40 mg total) by mouth daily before breakfast. 90 capsule 4  .  HUMIRA PEN 40 MG/0.4ML PNKT Inject 40 mg into the muscle every 14 (fourteen) days.   1  . hydrocortisone 2.5 % ointment     . lamoTRIgine (LAMICTAL) 200 MG tablet Take 1 tablet (200 mg total) by mouth daily. 90 tablet 1  . methylphenidate (RITALIN) 10 MG tablet TAKE 1 TABLET 3 TIMES DAILY WITH MEALS. 90 tablet 0  . methylphenidate 36 MG PO CR tablet Take 1 tablet (36 mg total) by mouth daily. 30 tablet 0   No current facility-administered medications for this visit.    Allergies  Allergen Reactions  . Nsaids Other (See Comments)    Constipation   Subjective:  Patient presents for session in no distress sharing recent progress.  He shared how he looks forward to attending a music festival in July.  He stated he booked it recently, going on to share how he got canceled last December which was upsetting as he lost some money due to having booked a place to stay and only getting a fraction of the money and return.  He shared how he needs this type of outlet for himself as attending different venues has been away for him to do stress for many years.  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  Shared how he has adjusted well to the recent medication change, feels is been a catalyst for him losing some weight as the last medication affected him by his  gaining about 20 pounds.  Facilitated his identifying efforts as well as self supportive thoughts towards positive changes he has made for himself over the past few months.    Interventions: CBT, supportive therapy, problem solving  Diagnoses:    ICD-10-CM   1. Bipolar affective disorder, rapid cycling ([place])  F31.9     Plan: Patient is to use CBT, mindfulness and coping skills to help manage decrease symptoms associated with their diagnosis.    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  Continue to  take steps toward effective communication in his relationship with is parents to keep stress low.    Long-term goal:   Reduce overall level, frequency, and intensity of the feelings of depression, anxiety evidenced by  decreased irritability, negative self talk, and improved functioning up to 80% of the time as reported by patient for at least 3 consecutive months.    Short-term goal:  Verbally express understanding of the relationship between feelings of depression, anxiety and their impact on thinking patterns and behaviors. Verbalize an understanding of the role that distorted thinking plays in creating fears, excessive worry, and ruminations. 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       Patient to report any concerns with his medications to his prescribing provider as needed.  Progress: progressing   Anson [place], [facility]

## 2020-10-19 ENCOUNTER — Ambulatory Visit: Payer: BC Managed Care – PPO | Admitting: Psychiatry

## 2020-10-19 DIAGNOSIS — M9903 Segmental and somatic dysfunction of lumbar region: Secondary | ICD-10-CM | POA: Diagnosis not present

## 2020-10-19 DIAGNOSIS — M9902 Segmental and somatic dysfunction of thoracic region: Secondary | ICD-10-CM | POA: Diagnosis not present

## 2020-10-19 DIAGNOSIS — M9905 Segmental and somatic dysfunction of pelvic region: Secondary | ICD-10-CM | POA: Diagnosis not present

## 2020-10-19 DIAGNOSIS — M9907 Segmental and somatic dysfunction of upper extremity: Secondary | ICD-10-CM | POA: Diagnosis not present
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
## 2020-10-22 DIAGNOSIS — M9905 Segmental and somatic dysfunction of pelvic region: Secondary | ICD-10-CM | POA: Diagnosis not present

## 2020-10-22 DIAGNOSIS — M9907 Segmental and somatic dysfunction of upper extremity: Secondary | ICD-10-CM | POA: Diagnosis not present

## 2020-10-22 DIAGNOSIS — M9903 Segmental and somatic dysfunction of lumbar region: Secondary | ICD-10-CM | POA: Diagnosis not present

## 2020-10-22 DIAGNOSIS — M9902 Segmental and somatic dysfunction of thoracic region: Secondary | ICD-10-CM | POA: Diagnosis not present

## 2020-10-26 ENCOUNTER — Other Ambulatory Visit: Payer: Self-pay

## 2020-10-26 ENCOUNTER — Encounter: Payer: Self-pay | Admitting: Psychiatry

## 2020-10-26 ENCOUNTER — Ambulatory Visit (INDEPENDENT_AMBULATORY_CARE_PROVIDER_SITE_OTHER): Payer: BC Managed Care – PPO | Admitting: Psychiatry

## 2020-10-26 VITALS — BP 143/84 | HR 83

## 2020-10-26 DIAGNOSIS — F319 Bipolar disorder, unspecified: Secondary | ICD-10-CM | POA: Diagnosis not present

## 2020-10-26 DIAGNOSIS — F4310 Post-traumatic stress disorder, unspecified: Secondary | ICD-10-CM

## 2020-10-26 DIAGNOSIS — R635 Abnormal weight gain: Secondary | ICD-10-CM

## 2020-10-26 DIAGNOSIS — F902 Attention-deficit hyperactivity disorder, combined type: Secondary | ICD-10-CM | POA: Diagnosis not present

## 2020-10-26 MED ORDER — METHYLPHENIDATE HCL 10 MG PO TABS: ORAL_TABLET | ORAL | 0 refills | Status: DC

## 2020-10-26 MED ORDER — METHYLPHENIDATE HCL ER (OSM) 36 MG PO TBCR: 36.0000 mg | EXTENDED_RELEASE_TABLET | Freq: Every day | ORAL | 0 refills | Status: DC

## 2020-10-26 MED ORDER — DIVALPROEX SODIUM ER 500 MG PO TB24
1500.0000 mg | ORAL_TABLET | Freq: Every day | ORAL | 1 refills | Status: DC
Start: 2020-10-26 — End: 2021-05-24
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
## 2020-10-29 DIAGNOSIS — M9902 Segmental and somatic dysfunction of thoracic region: Secondary | ICD-10-CM | POA: Diagnosis not present

## 2020-10-29 DIAGNOSIS — M9903 Segmental and somatic dysfunction of lumbar region: Secondary | ICD-10-CM | POA: Diagnosis not present

## 2020-10-29 DIAGNOSIS — M9907 Segmental and somatic dysfunction of upper extremity: Secondary | ICD-10-CM | POA: Diagnosis not present

## 2020-10-29 DIAGNOSIS — M9905 Segmental and somatic dysfunction of pelvic region: Secondary | ICD-10-CM | POA: Diagnosis not present

## 2020-11-01 ENCOUNTER — Other Ambulatory Visit: Payer: Self-pay

## 2020-11-01 ENCOUNTER — Telehealth: Payer: Self-pay | Admitting: Internal Medicine

## 2020-11-01 ENCOUNTER — Ambulatory Visit (INDEPENDENT_AMBULATORY_CARE_PROVIDER_SITE_OTHER): Payer: BC Managed Care – PPO | Admitting: Mental Health

## 2020-11-01 DIAGNOSIS — F319 Bipolar disorder, unspecified: Secondary | ICD-10-CM

## 2020-11-01 NOTE — Telephone Encounter (Signed)
Patient states he is having a bad flare up of gout for the past two days and is wondering if Dr. Alain Marion could send something in for him. Denies appt at this time

## 2020-11-02 DIAGNOSIS — M9902 Segmental and somatic dysfunction of thoracic region: Secondary | ICD-10-CM | POA: Diagnosis not present

## 2020-11-02 DIAGNOSIS — M9907 Segmental and somatic dysfunction of upper extremity: Secondary | ICD-10-CM | POA: Diagnosis not present

## 2020-11-02 DIAGNOSIS — M9903 Segmental and somatic dysfunction of lumbar region: Secondary | ICD-10-CM | POA: Diagnosis not present

## 2020-11-02 DIAGNOSIS — M9905 Segmental and somatic dysfunction of pelvic region: Secondary | ICD-10-CM | POA: Diagnosis not present

## 2020-11-02 MED ORDER — METHYLPREDNISOLONE 4 MG PO TBPK: ORAL_TABLET | ORAL | 0 refills | Status: DC

## 2020-11-02 NOTE — Telephone Encounter (Signed)
Called pt there was no answer LMOM w/mD response.Marland KitchenJohny Morgan

## 2020-11-02 NOTE — Telephone Encounter (Signed)
Okay Medrol pack-done.  Office visit if problems.  Thanks

## 2020-11-05 DIAGNOSIS — M9905 Segmental and somatic dysfunction of pelvic region: Secondary | ICD-10-CM | POA: Diagnosis not present

## 2020-11-05 DIAGNOSIS — M9902 Segmental and somatic dysfunction of thoracic region: Secondary | ICD-10-CM | POA: Diagnosis not present

## 2020-11-05 DIAGNOSIS — M9903 Segmental and somatic dysfunction of lumbar region: Secondary | ICD-10-CM | POA: Diagnosis not present

## 2020-11-05 DIAGNOSIS — M9907 Segmental and somatic dysfunction of upper extremity: Secondary | ICD-10-CM | POA: Diagnosis not present
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
## 2020-11-14 ENCOUNTER — Ambulatory Visit: Payer: BC Managed Care – PPO | Admitting: Psychiatry

## 2020-11-15 ENCOUNTER — Other Ambulatory Visit: Payer: Self-pay

## 2020-11-15 ENCOUNTER — Ambulatory Visit (INDEPENDENT_AMBULATORY_CARE_PROVIDER_SITE_OTHER): Payer: BC Managed Care – PPO | Admitting: Mental Health

## 2020-11-15 DIAGNOSIS — F319 Bipolar disorder, unspecified: Secondary | ICD-10-CM | POA: Diagnosis not present

## 2020-11-15 NOTE — Progress Notes (Signed)
Psychotherapy Note  Name: Francisco Morgan Date:  11/15/20 MRN: [id number] DOB: Jun 28, 1976 PCP: [name], MD  Time spent: 53 minutes  Treatment: Individual therapy  Mental Status Exam: Appearance:   casual  Behavior:  appropriate  Motor:  WNL  Speech/Language:    Clear, coherent  Affect:  Full range  Mood:  euthymic  Thought process:  normal  Thought content:   WNL  Sensory/Perceptual disturbances:   WNL  Orientation:  x4  Attention:  Good  Concentration:  Good  Memory:  WNL  Fund of knowledge:   WNL  Insight:   good  Judgment:   good  Impulse Control:  good   Reported Symptoms:  Decreased sleep intermittently, some anxiety and irritability, ruminations  Risk Assessment: Danger to Self:  No Self-injurious Behavior: No Danger to Others: No Duty to Warn:no Physical Aggression / Violence:No  Access to Firearms a concern: No  Gang Involvement:No  Patient / guardian was educated about steps to take if suicide or homicide risk level increases between visits: yes While future psychiatric events cannot be accurately predicted, the patient does not currently require acute inpatient psychiatric care and does not currently meet [facility] involuntary commitment criteria.  Medications: Current Outpatient Medications  Medication Sig Dispense Refill  . b complex vitamins tablet Take 1 tablet by mouth daily. 100 tablet 3  . celecoxib (CELEBREX) 200 MG capsule TAKE (1) CAPSULE TWICE DAILY. 180 capsule 0  . Cholecalciferol (VITAMIN D) 2000 units CAPS 1 daily 100 capsule 3  . clonazePAM (KLONOPIN) 0.5 MG tablet Take 1 tablet (0.5 mg total) by mouth 3 (three) times daily as needed for anxiety. 60 tablet 2  . divalproex (DEPAKOTE ER) 500 MG 24 hr tablet Take 3 tablets (1,500 mg total) by mouth daily. 1 nightly for 5 nights, then 2 nightly for 5 nights, then 3 nightly 270 tablet 1  . esomeprazole (NEXIUM) 40 MG capsule Take 1 capsule (40 mg total) by  mouth daily before breakfast. 90 capsule 4  . HUMIRA PEN 40 MG/0.4ML PNKT Inject 40 mg into the muscle every 14 (fourteen) days.   1  . hydrocortisone 2.5 % ointment     . lamoTRIgine (LAMICTAL) 200 MG tablet Take 1 tablet (200 mg total) by mouth daily. 90 tablet 1  . methylphenidate (RITALIN) 10 MG tablet TAKE 1 TABLET 3 TIMES DAILY WITH MEALS. 270 tablet 0  . methylphenidate 36 MG PO CR tablet Take 1 tablet (36 mg total) by mouth daily. 90 tablet 0  . methylPREDNISolone (MEDROL DOSEPAK) 4 MG TBPK tablet As directed 21 tablet 0   No current facility-administered medications for this visit.    Allergies  Allergen Reactions  . Nsaids Other (See Comments)    Constipation   Subjective:  Patient presents for session.  He shared recent progress, working with his parents on managing and giving him guidance as needed with the different properties that they have been working on, sold over the past few months.  Reports he has done well with communication with them, his mother specifically, less arguments recently.  He shared how they spend some time together earlier today looking at house to possibly move to if this is what his parents decide.  He continues to reside with them, managing in a small space.  Reports being able to work on one of his parents properties, remodeling looking forward to putting it on the market in the next few months.  He reports feeling the change back to Depakote  has been helpful with his ability to work and get tasks done to this effort more consistently.  He was sick last week with the flu but has fully recovered and plans to return to the project today.  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  Through guided discovery, the need to balance work and managing his health and medical issues toward maintaining self-care throughout remains his focus.  Facilitated his identifying some specific ways he can do this for himself.    Interventions: CBT, supportive  therapy, problem solving  Diagnoses:    ICD-10-CM   1. Bipolar affective disorder, rapid cycling ([place])  F31.9     Plan: Patient is to use CBT, mindfulness and coping skills to help manage decrease symptoms associated with their diagnosis.    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  Continue to take steps toward effective communication in his relationship with is parents to keep stress low.    Long-term goal:   Reduce overall level, frequency, and intensity of the feelings of depression, anxiety evidenced by  decreased irritability, negative self talk, and improved functioning up to 80% of the time as reported by patient for at least 3 consecutive months.    Short-term goal:  Verbally express understanding of the relationship between feelings of depression, anxiety and their impact on thinking patterns and behaviors. Verbalize an understanding of the role that distorted thinking plays in creating fears, excessive worry, and ruminations. 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       Patient to report any concerns with his medications to his prescribing provider as needed.  Progress: progressing   Anson [place], [facility]

## 2020-11-19 DIAGNOSIS — M545 Low back pain, unspecified: Secondary | ICD-10-CM | POA: Diagnosis not present

## 2020-11-19 DIAGNOSIS — L400 Psoriasis vulgaris: Secondary | ICD-10-CM | POA: Diagnosis not present

## 2020-11-19 DIAGNOSIS — L4059 Other psoriatic arthropathy: Secondary | ICD-10-CM | POA: Diagnosis not present

## 2020-11-19 DIAGNOSIS — Z1589 Genetic susceptibility to other disease: Secondary | ICD-10-CM | POA: Diagnosis not present

## 2020-11-19 DIAGNOSIS — M1A09X0 Idiopathic chronic gout, multiple sites, without tophus (tophi): Secondary | ICD-10-CM | POA: Diagnosis not present

## 2020-11-22 DIAGNOSIS — M9902 Segmental and somatic dysfunction of thoracic region: Secondary | ICD-10-CM | POA: Diagnosis not present

## 2020-11-22 DIAGNOSIS — M9907 Segmental and somatic dysfunction of upper extremity: Secondary | ICD-10-CM | POA: Diagnosis not present

## 2020-11-22 DIAGNOSIS — M9905 Segmental and somatic dysfunction of pelvic region: Secondary | ICD-10-CM | POA: Diagnosis not present

## 2020-11-22 DIAGNOSIS — M9903 Segmental and somatic dysfunction of lumbar region: Secondary | ICD-10-CM | POA: Diagnosis not present

## 2020-11-26 DIAGNOSIS — M9907 Segmental and somatic dysfunction of upper extremity: Secondary | ICD-10-CM | POA: Diagnosis not present

## 2020-11-26 DIAGNOSIS — M9902 Segmental and somatic dysfunction of thoracic region: Secondary | ICD-10-CM | POA: Diagnosis not present

## 2020-11-26 DIAGNOSIS — M9903 Segmental and somatic dysfunction of lumbar region: Secondary | ICD-10-CM | POA: Diagnosis not present

## 2020-11-26 DIAGNOSIS — M9905 Segmental and somatic dysfunction of pelvic region: Secondary | ICD-10-CM | POA: Diagnosis not present

## 2020-11-29 ENCOUNTER — Other Ambulatory Visit: Payer: Self-pay

## 2020-11-29 ENCOUNTER — Ambulatory Visit (INDEPENDENT_AMBULATORY_CARE_PROVIDER_SITE_OTHER): Payer: BC Managed Care – PPO | Admitting: Mental Health

## 2020-11-29 DIAGNOSIS — M9905 Segmental and somatic dysfunction of pelvic region: Secondary | ICD-10-CM | POA: Diagnosis not present

## 2020-11-29 DIAGNOSIS — M9903 Segmental and somatic dysfunction of lumbar region: Secondary | ICD-10-CM | POA: Diagnosis not present

## 2020-11-29 DIAGNOSIS — F319 Bipolar disorder, unspecified: Secondary | ICD-10-CM | POA: Diagnosis not present

## 2020-11-29 DIAGNOSIS — M9902 Segmental and somatic dysfunction of thoracic region: Secondary | ICD-10-CM | POA: Diagnosis not present

## 2020-11-29 DIAGNOSIS — M9907 Segmental and somatic dysfunction of upper extremity: Secondary | ICD-10-CM | POA: Diagnosis not present

## 2020-11-29 NOTE — Progress Notes (Signed)
Psychotherapy Note  Name: Francisco Morgan Date:  11/29/20 MRN: [id number] DOB: [birth year] PCP: [name], MD  Time spent: 53 minutes  Treatment: Individual therapy  Mental Status Exam: Appearance:   casual  Behavior:  appropriate  Motor:  WNL  Speech/Language:    Clear, coherent  Affect:  Full range  Mood:  euthymic  Thought process:  normal  Thought content:   WNL  Sensory/Perceptual disturbances:   WNL  Orientation:  x4  Attention:  Good  Concentration:  Good  Memory:  WNL  Fund of knowledge:   WNL  Insight:   good  Judgment:   good  Impulse Control:  good   Reported Symptoms:  Decreased sleep intermittently, some anxiety and irritability, ruminations  Risk Assessment: Danger to Self:  No Self-injurious Behavior: No Danger to Others: No Duty to Warn:no Physical Aggression / Violence:No  Access to Firearms a concern: No  Gang Involvement:No  Patient / guardian was educated about steps to take if suicide or homicide risk level increases between visits: yes While future psychiatric events cannot be accurately predicted, the patient does not currently require acute inpatient psychiatric care and does not currently meet [facility] involuntary commitment criteria.  Medications: Current Outpatient Medications  Medication Sig Dispense Refill  . b complex vitamins tablet Take 1 tablet by mouth daily. 100 tablet 3  . celecoxib (CELEBREX) 200 MG capsule TAKE (1) CAPSULE TWICE DAILY. 180 capsule 0  . Cholecalciferol (VITAMIN D) 2000 units CAPS 1 daily 100 capsule 3  . clonazePAM (KLONOPIN) 0.5 MG tablet Take 1 tablet (0.5 mg total) by mouth 3 (three) times daily as needed for anxiety. 60 tablet 2  . divalproex (DEPAKOTE ER) 500 MG 24 hr tablet Take 3 tablets (1,500 mg total) by mouth daily. 1 nightly for 5 nights, then 2 nightly for 5 nights, then 3 nightly 270 tablet 1  . esomeprazole (NEXIUM) 40 MG capsule Take 1 capsule (40 mg total) by  mouth daily before breakfast. 90 capsule 4  . HUMIRA PEN 40 MG/0.4ML PNKT Inject 40 mg into the muscle every 14 (fourteen) days.   1  . hydrocortisone 2.5 % ointment     . lamoTRIgine (LAMICTAL) 200 MG tablet Take 1 tablet (200 mg total) by mouth daily. 90 tablet 1  . methylphenidate (RITALIN) 10 MG tablet TAKE 1 TABLET 3 TIMES DAILY WITH MEALS. 270 tablet 0  . methylphenidate 36 MG PO CR tablet Take 1 tablet (36 mg total) by mouth daily. 90 tablet 0  . methylPREDNISolone (MEDROL DOSEPAK) 4 MG TBPK tablet As directed 21 tablet 0   No current facility-administered medications for this visit.    Allergies  Allergen Reactions  . Nsaids Other (See Comments)    Constipation   Subjective:  Patient presents for session.   He shared how he continues to reside with his parents, sharing some day-to-day interactions.  He continues also to work on one of his parents properties to ready for sale in the coming months.  He stated that he has been able to set some boundaries as needed with his mother, as she has been busy looking at other potential rental properties.  He went on to share his knowledge of real estate and his efforts to communicate with his mother so that she can have realistic expectations.  The need to set boundaries at times in this area was shared with patient and he shared his efforts.  He reports that they have been "getting along better" over  the last couple of months, that they have also not pressured him to complete the current construction project as they were doing a few months ago, which patient shared allows him to work more effectively on the job toward completion.   Interventions: CBT, supportive therapy, problem solving  Diagnoses:    ICD-10-CM   1. Bipolar affective disorder, rapid cycling ([place])  F31.9     Plan: Patient is to use CBT, mindfulness and coping skills to help manage decrease symptoms associated with their diagnosis.    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  Continue to take steps toward effective communication in his relationship with is parents to keep stress low.    Long-term goal:   Reduce overall level, frequency, and intensity of the feelings of depression, anxiety evidenced by  decreased irritability, negative self talk, and improved functioning up to 80% of the time as reported by patient for at least 3 consecutive months.    Short-term goal:  Verbally express understanding of the relationship between feelings of depression, anxiety and their impact on thinking patterns and behaviors. Verbalize an understanding of the role that distorted thinking plays in creating fears, excessive worry, and ruminations. 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       Patient to report any concerns with his medications to his prescribing provider as needed.  Progress: progressing   Anson [place], [facility]

## 2020-12-03 DIAGNOSIS — M9903 Segmental and somatic dysfunction of lumbar region: Secondary | ICD-10-CM | POA: Diagnosis not present

## 2020-12-03 DIAGNOSIS — M9902 Segmental and somatic dysfunction of thoracic region: Secondary | ICD-10-CM | POA: Diagnosis not present

## 2020-12-03 DIAGNOSIS — M9905 Segmental and somatic dysfunction of pelvic region: Secondary | ICD-10-CM | POA: Diagnosis not present

## 2020-12-03 DIAGNOSIS — M9907 Segmental and somatic dysfunction of upper extremity: Secondary | ICD-10-CM | POA: Diagnosis not present

## 2020-12-07 DIAGNOSIS — M9907 Segmental and somatic dysfunction of upper extremity: Secondary | ICD-10-CM | POA: Diagnosis not present

## 2020-12-07 DIAGNOSIS — M9905 Segmental and somatic dysfunction of pelvic region: Secondary | ICD-10-CM | POA: Diagnosis not present

## 2020-12-07 DIAGNOSIS — M9902 Segmental and somatic dysfunction of thoracic region: Secondary | ICD-10-CM | POA: Diagnosis not present

## 2020-12-07 DIAGNOSIS — M9903 Segmental and somatic dysfunction of lumbar region: Secondary | ICD-10-CM | POA: Diagnosis not present

## 2020-12-13 ENCOUNTER — Ambulatory Visit (INDEPENDENT_AMBULATORY_CARE_PROVIDER_SITE_OTHER): Payer: BC Managed Care – PPO | Admitting: Mental Health

## 2020-12-13 ENCOUNTER — Other Ambulatory Visit: Payer: Self-pay

## 2020-12-13 DIAGNOSIS — M9907 Segmental and somatic dysfunction of upper extremity: Secondary | ICD-10-CM | POA: Diagnosis not present

## 2020-12-13 DIAGNOSIS — M9903 Segmental and somatic dysfunction of lumbar region: Secondary | ICD-10-CM | POA: Diagnosis not present

## 2020-12-13 DIAGNOSIS — F319 Bipolar disorder, unspecified: Secondary | ICD-10-CM | POA: Diagnosis not present

## 2020-12-13 DIAGNOSIS — M9902 Segmental and somatic dysfunction of thoracic region: Secondary | ICD-10-CM | POA: Diagnosis not present

## 2020-12-13 DIAGNOSIS — M9905 Segmental and somatic dysfunction of pelvic region: Secondary | ICD-10-CM | POA: Diagnosis not present

## 2020-12-13 NOTE — Progress Notes (Signed)
Psychotherapy Note  Name: Francisco Morgan Date:  12/12/20 MRN: [id number] DOB: 1976/02/21 PCP: [name], MD  Time spent: 54 minutes  Treatment: Individual therapy  Mental Status Exam: Appearance:   casual  Behavior:  appropriate  Motor:  WNL  Speech/Language:    Clear, coherent  Affect:  Full range  Mood:  euthymic  Thought process:  normal  Thought content:   WNL  Sensory/Perceptual disturbances:   WNL  Orientation:  x4  Attention:  Good  Concentration:  Good  Memory:  WNL  Fund of knowledge:   WNL  Insight:   good  Judgment:   good  Impulse Control:  good   Reported Symptoms:  Decreased sleep intermittently, some anxiety and irritability, ruminations  Risk Assessment: Danger to Self:  No Self-injurious Behavior: No Danger to Others: No Duty to Warn:no Physical Aggression / Violence:No  Access to Firearms a concern: No  Gang Involvement:No  Patient / guardian was educated about steps to take if suicide or homicide risk level increases between visits: yes While future psychiatric events cannot be accurately predicted, the patient does not currently require acute inpatient psychiatric care and does not currently meet [facility] involuntary commitment criteria.  Medications: Current Outpatient Medications  Medication Sig Dispense Refill  . b complex vitamins tablet Take 1 tablet by mouth daily. 100 tablet 3  . celecoxib (CELEBREX) 200 MG capsule TAKE (1) CAPSULE TWICE DAILY. 180 capsule 0  . Cholecalciferol (VITAMIN D) 2000 units CAPS 1 daily 100 capsule 3  . clonazePAM (KLONOPIN) 0.5 MG tablet Take 1 tablet (0.5 mg total) by mouth 3 (three) times daily as needed for anxiety. 60 tablet 2  . divalproex (DEPAKOTE ER) 500 MG 24 hr tablet Take 3 tablets (1,500 mg total) by mouth daily. 1 nightly for 5 nights, then 2 nightly for 5 nights, then 3 nightly 270 tablet 1  . esomeprazole (NEXIUM) 40 MG capsule Take 1 capsule (40 mg total) by  mouth daily before breakfast. 90 capsule 4  . HUMIRA PEN 40 MG/0.4ML PNKT Inject 40 mg into the muscle every 14 (fourteen) days.   1  . hydrocortisone 2.5 % ointment     . lamoTRIgine (LAMICTAL) 200 MG tablet Take 1 tablet (200 mg total) by mouth daily. 90 tablet 1  . methylphenidate (RITALIN) 10 MG tablet TAKE 1 TABLET 3 TIMES DAILY WITH MEALS. 270 tablet 0  . methylphenidate 36 MG PO CR tablet Take 1 tablet (36 mg total) by mouth daily. 90 tablet 0  . methylPREDNISolone (MEDROL DOSEPAK) 4 MG TBPK tablet As directed 21 tablet 0   No current facility-administered medications for this visit.    Allergies  Allergen Reactions  . Nsaids Other (See Comments)    Constipation   Subjective:  Patient presents for session. Patient shared reason events, how he continues to work with his mother and she is searching for a house to buy. went on to share some of the challenges of their communication, ways he has had to set some boundaries with his mother. Patient stated that he thrives on being able to provide sound useful information in the process as he has extensive experience in real estate, however, at times she will not accept his advice or direction. Facilitate his identifying ways with which he feels he could communicate effectively while also doing so when setting some boundaries as he feels needed.    Interventions: CBT, supportive therapy, problem solving  Diagnoses:    ICD-10-CM   1. Bipolar  affective disorder, rapid cycling ([place])  F31.9     Plan: Patient is to use CBT, mindfulness and coping skills to help manage decrease symptoms associated with their diagnosis.    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  Continue to take steps toward effective communication in his relationship with is parents to keep stress low.    Long-term goal:   Reduce overall level, frequency, and intensity of the feelings of depression, anxiety  evidenced by  decreased irritability, negative self talk, and improved functioning up to 80% of the time as reported by patient for at least 3 consecutive months.    Short-term goal:  Verbally express understanding of the relationship between feelings of depression, anxiety and their impact on thinking patterns and behaviors. Verbalize an understanding of the role that distorted thinking plays in creating fears, excessive worry, and ruminations. 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       Patient to report any concerns with his medications to his prescribing provider as needed.  Progress: progressing   Anson [place], [facility]

## 2020-12-17 ENCOUNTER — Ambulatory Visit (INDEPENDENT_AMBULATORY_CARE_PROVIDER_SITE_OTHER): Payer: BC Managed Care – PPO | Admitting: Internal Medicine

## 2020-12-17 ENCOUNTER — Other Ambulatory Visit: Payer: Self-pay

## 2020-12-17 ENCOUNTER — Encounter: Payer: Self-pay | Admitting: Internal Medicine

## 2020-12-17 DIAGNOSIS — K130 Diseases of lips: Secondary | ICD-10-CM | POA: Diagnosis not present

## 2020-12-17 MED ORDER — CLOTRIMAZOLE-BETAMETHASONE 1-0.05 % EX CREA: 1.0000 "application " | TOPICAL_CREAM | Freq: Three times a day (TID) | CUTANEOUS | 1 refills | Status: AC | PRN

## 2020-12-17 NOTE — Assessment & Plan Note (Signed)
Lotrisone cream Rx - use prn
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
## 2020-12-25 ENCOUNTER — Telehealth: Payer: Self-pay | Admitting: Psychiatry

## 2020-12-25 ENCOUNTER — Telehealth: Payer: Self-pay | Admitting: Internal Medicine

## 2020-12-25 NOTE — Telephone Encounter (Signed)
Type of form received: DMV [place] Medical Report    Additional comments: Patient has some travel plans and will need it to go on his trip.  Received by: [place] R  Form should be Faxed to: [phone]  Form should be mailed to:  N/A  Is patient requesting call for pickup: Y- [phone]   Form placed in the Provider's box.  *Attach charge sheet.  Provider will determine charge.*  Was patient informed of  7-10 business day turn around (Y/N)? Darreld Mclean
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
## 2020-12-25 NOTE — Telephone Encounter (Signed)
Pt dropped form to be filled out along with letter for [facility]. Received 5/31 left on Traci's desk

## 2020-12-26 NOTE — Telephone Encounter (Signed)
Letter dictated.  No medical or psychiatric reason to restrict driving.

## 2020-12-26 NOTE — Telephone Encounter (Signed)
noted 

## 2020-12-26 NOTE — Telephone Encounter (Addendum)
Place form on MD desk (blue folder).Marland KitchenJohny Morgan  Done. AP

## 2020-12-27 ENCOUNTER — Ambulatory Visit: Payer: BC Managed Care – PPO | Admitting: Mental Health

## 2021-01-08 ENCOUNTER — Encounter: Payer: Self-pay | Admitting: Dermatology

## 2021-01-08 ENCOUNTER — Ambulatory Visit (INDEPENDENT_AMBULATORY_CARE_PROVIDER_SITE_OTHER): Payer: BC Managed Care – PPO | Admitting: Dermatology

## 2021-01-08 ENCOUNTER — Other Ambulatory Visit: Payer: Self-pay

## 2021-01-08 DIAGNOSIS — D1801 Hemangioma of skin and subcutaneous tissue: Secondary | ICD-10-CM | POA: Diagnosis not present

## 2021-01-08 DIAGNOSIS — L821 Other seborrheic keratosis: Secondary | ICD-10-CM | POA: Diagnosis not present

## 2021-01-08 DIAGNOSIS — D2239 Melanocytic nevi of other parts of face: Secondary | ICD-10-CM

## 2021-01-08 DIAGNOSIS — D239 Other benign neoplasm of skin, unspecified: Secondary | ICD-10-CM

## 2021-01-08 DIAGNOSIS — Z1283 Encounter for screening for malignant neoplasm of skin: Secondary | ICD-10-CM | POA: Diagnosis not present

## 2021-01-08 DIAGNOSIS — L905 Scar conditions and fibrosis of skin: Secondary | ICD-10-CM

## 2021-01-08 DIAGNOSIS — L739 Follicular disorder, unspecified: Secondary | ICD-10-CM

## 2021-01-08 DIAGNOSIS — L738 Other specified follicular disorders: Secondary | ICD-10-CM

## 2021-01-08 DIAGNOSIS — D2371 Other benign neoplasm of skin of right lower limb, including hip: Secondary | ICD-10-CM

## 2021-01-08 DIAGNOSIS — D229 Melanocytic nevi, unspecified: Secondary | ICD-10-CM

## 2021-01-10 ENCOUNTER — Ambulatory Visit (INDEPENDENT_AMBULATORY_CARE_PROVIDER_SITE_OTHER): Payer: BC Managed Care – PPO | Admitting: Mental Health

## 2021-01-10 ENCOUNTER — Other Ambulatory Visit: Payer: Self-pay

## 2021-01-10 DIAGNOSIS — F319 Bipolar disorder, unspecified: Secondary | ICD-10-CM | POA: Diagnosis not present

## 2021-01-10 NOTE — Progress Notes (Signed)
Psychotherapy Note  Name: Francisco Morgan Date:  01/10/21 MRN: [id number] DOB: 10-01-1975 PCP: [name], MD  Time spent: 53 minutes  Treatment: Individual therapy  Mental Status Exam: Appearance:    casual  Behavior:   appropriate  Motor:   WNL  Speech/Language:     Clear, coherent  Affect:   Full range  Mood:   euthymic  Thought process:   normal  Thought content:     WNL  Sensory/Perceptual disturbances:     WNL  Orientation:   x4  Attention:   Good  Concentration:   Good  Memory:   WNL  Fund of knowledge:    WNL  Insight:     good  Judgment:    good  Impulse Control:   good   Reported Symptoms:  Decreased sleep intermittently, some anxiety and irritability, ruminations  Risk Assessment: Danger to Self:  No Self-injurious Behavior: No Danger to Others: No Duty to Warn:no Physical Aggression / Violence:No  Access to Firearms a concern: No  Gang Involvement:No  Patient / guardian was educated about steps to take if suicide or homicide risk level increases between visits: yes While future psychiatric events cannot be accurately predicted, the patient does not currently require acute inpatient psychiatric care and does not currently meet [facility] involuntary commitment criteria.  Medications: Current Outpatient Medications  Medication Sig Dispense Refill   b complex vitamins tablet Take 1 tablet by mouth daily. 100 tablet 3   celecoxib (CELEBREX) 200 MG capsule TAKE (1) CAPSULE TWICE DAILY. 180 capsule 0   Cholecalciferol (VITAMIN D) 2000 units CAPS 1 daily 100 capsule 3   clonazePAM (KLONOPIN) 0.5 MG tablet Take 1 tablet (0.5 mg total) by mouth 3 (three) times daily as needed for anxiety. 60 tablet 2   clotrimazole-betamethasone (LOTRISONE) cream Apply 1 application topically 3 (three) times daily as needed. 30 g 1   divalproex (DEPAKOTE ER) 500 MG 24 hr tablet Take 3 tablets (1,500 mg total) by mouth daily. 1 nightly for 5 nights, then 2 nightly  for 5 nights, then 3 nightly 270 tablet 1   esomeprazole (NEXIUM) 40 MG capsule Take 1 capsule (40 mg total) by mouth daily before breakfast. 90 capsule 4   HUMIRA PEN 40 MG/0.4ML PNKT Inject 40 mg into the muscle every 14 (fourteen) days.   1   lamoTRIgine (LAMICTAL) 200 MG tablet Take 1 tablet (200 mg total) by mouth daily. 90 tablet 1   methylphenidate (RITALIN) 10 MG tablet TAKE 1 TABLET 3 TIMES DAILY WITH MEALS. 270 tablet 0   methylphenidate 36 MG PO CR tablet Take 1 tablet (36 mg total) by mouth daily. 90 tablet 0   No current facility-administered medications for this visit.    Allergies  Allergen Reactions   Nsaids Other (See Comments)    Constipation   Subjective:  Patient presents for session.  He shared recent progress, how he continues to make progress on the remodeling project.  He went on to share progress he has been specifically and how he and his mother have been getting along well overall.  He looks forward to being able to go on vacation next week describing in detail his plans which is to attend a music venue that will last about 3 days. 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  Ways to cope and care for himself, getting enough  rest also to avoid any injury from the physical work with which he is engaging was identified as a ongoing need to be mindful.    Interventions: CBT, supportive therapy, problem solving  Diagnoses:    ICD-10-CM   1. Bipolar affective disorder, rapid cycling ([place])  F31.9        Plan: Patient is to use CBT, mindfulness and coping skills to help manage decrease symptoms associated with their diagnosis.    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  Continue to take steps toward effective communication in his relationship with is parents to keep stress  low.    Long-term goal:   Reduce overall level, frequency, and intensity of the feelings of depression, anxiety evidenced by  decreased irritability, negative self talk, and improved functioning up to 80% of the time as reported by patient for at least 3 consecutive months.    Short-term goal:  Verbally express understanding of the relationship between feelings of depression, anxiety and their impact on thinking patterns and behaviors. Verbalize an understanding of the role that distorted thinking plays in creating fears, excessive worry, and ruminations. 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       Patient to report any concerns with his medications to his prescribing provider as needed.  Progress: progressing   Anson [place], [place]

## 2021-01-11 ENCOUNTER — Encounter: Payer: Self-pay | Admitting: Dermatology
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
## 2021-01-14 ENCOUNTER — Ambulatory Visit (INDEPENDENT_AMBULATORY_CARE_PROVIDER_SITE_OTHER): Payer: BC Managed Care – PPO | Admitting: Orthopedic Surgery

## 2021-01-14 ENCOUNTER — Other Ambulatory Visit: Payer: Self-pay

## 2021-01-14 ENCOUNTER — Encounter: Payer: Self-pay | Admitting: Orthopedic Surgery

## 2021-01-14 ENCOUNTER — Ambulatory Visit: Payer: Self-pay

## 2021-01-14 DIAGNOSIS — M25531 Pain in right wrist: Secondary | ICD-10-CM | POA: Diagnosis not present

## 2021-01-14 DIAGNOSIS — M25871 Other specified joint disorders, right ankle and foot: Secondary | ICD-10-CM | POA: Diagnosis not present

## 2021-01-14 MED ORDER — METHYLPREDNISOLONE ACETATE 40 MG/ML IJ SUSP
40.0000 mg | INTRAMUSCULAR | Status: AC | PRN
  Administered 2021-01-14: 40 mg via INTRA_ARTICULAR

## 2021-01-14 MED ORDER — LIDOCAINE HCL 1 % IJ SOLN
2.0000 mL | INTRAMUSCULAR | Status: AC | PRN
Start: 2021-01-14 — End: 2021-01-14
  Administered 2021-01-14: 2 mL
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
## 2021-01-24 ENCOUNTER — Other Ambulatory Visit: Payer: Self-pay

## 2021-01-24 ENCOUNTER — Other Ambulatory Visit: Payer: Self-pay | Admitting: Psychiatry

## 2021-01-24 ENCOUNTER — Ambulatory Visit (INDEPENDENT_AMBULATORY_CARE_PROVIDER_SITE_OTHER): Payer: BC Managed Care – PPO | Admitting: Mental Health

## 2021-01-24 DIAGNOSIS — F319 Bipolar disorder, unspecified: Secondary | ICD-10-CM | POA: Diagnosis not present

## 2021-01-24 DIAGNOSIS — F902 Attention-deficit hyperactivity disorder, combined type: Secondary | ICD-10-CM

## 2021-01-24 NOTE — Progress Notes (Signed)
Psychotherapy Note  Name: Francisco Morgan Date:  01/24/21 MRN: [id number] DOB: 04/26/1976 PCP: [name], MD  Time spent: 54 minutes  Treatment: Individual therapy  Mental Status Exam: Appearance:    casual  Behavior:   appropriate  Motor:   WNL  Speech/Language:     Clear, coherent  Affect:   Full range  Mood:   euthymic  Thought process:   normal  Thought content:     WNL  Sensory/Perceptual disturbances:     WNL  Orientation:   x4  Attention:   Good  Concentration:   Good  Memory:   WNL  Fund of knowledge:    WNL  Insight:     good  Judgment:    good  Impulse Control:   good   Reported Symptoms:  Decreased sleep intermittently, some anxiety and irritability, ruminations  Risk Assessment: Danger to Self:  No Self-injurious Behavior: No Danger to Others: No Duty to Warn:no Physical Aggression / Violence:No  Access to Firearms a concern: No  Gang Involvement:No  Patient / guardian was educated about steps to take if suicide or homicide risk level increases between visits: yes While future psychiatric events cannot be accurately predicted, the patient does not currently require acute inpatient psychiatric care and does not currently meet [facility] involuntary commitment criteria.  Medications: Current Outpatient Medications  Medication Sig Dispense Refill   b complex vitamins tablet Take 1 tablet by mouth daily. 100 tablet 3   celecoxib (CELEBREX) 200 MG capsule TAKE (1) CAPSULE TWICE DAILY. 180 capsule 0   Cholecalciferol (VITAMIN D) 2000 units CAPS 1 daily 100 capsule 3   clonazePAM (KLONOPIN) 0.5 MG tablet Take 1 tablet (0.5 mg total) by mouth 3 (three) times daily as needed for anxiety. 60 tablet 2   clotrimazole-betamethasone (LOTRISONE) cream Apply 1 application topically 3 (three) times daily as needed. 30 g 1   divalproex (DEPAKOTE ER) 500 MG 24 hr tablet Take 3 tablets (1,500 mg total) by mouth daily. 1 nightly for 5 nights, then 2 nightly  for 5 nights, then 3 nightly 270 tablet 1   esomeprazole (NEXIUM) 40 MG capsule Take 1 capsule (40 mg total) by mouth daily before breakfast. 90 capsule 4   HUMIRA PEN 40 MG/0.4ML PNKT Inject 40 mg into the muscle every 14 (fourteen) days.   1   lamoTRIgine (LAMICTAL) 200 MG tablet Take 1 tablet (200 mg total) by mouth daily. 90 tablet 1   methylphenidate (RITALIN) 10 MG tablet TAKE 1 TABLET 3 TIMES DAILY WITH MEALS. 270 tablet 0   methylphenidate 36 MG PO CR tablet Take 1 tablet (36 mg total) by mouth daily. 90 tablet 0   No current facility-administered medications for this visit.    Allergies  Allergen Reactions   Nsaids Other (See Comments)    Constipation   Subjective:  Patient presents for session on time.  Assessed recent events, progress where he stated that he enjoyed his recent travel to an outdoor concert, going on to share details of the experience with which he enjoys engaging.  He shared challenges, such as with his airline flight as he arrived too late to board the plane and discussed with staff.  He shared the exchange, how he is pleased with his communication, able to share his concerns with the staff without becoming upset, with more of a focus on problem solving the issue at the time.  Also upon returning home sharing some comments from his mother that were upsetting  where he feels that she can be negative at times where he made an effort not to respond although he felt somewhat frustrated at the time facilitated his identification of thoughts and feelings where he identified some level of guilt about taking the trip based on how his parents communicated with him upon his return, which has occurred in the past.  Patient identified his being able to take breaks at times from work which is primarily relating with his parents properties, as needed as an outlet for stress reduction and enjoyment.  Interventions: CBT, supportive therapy, problem solving  Diagnoses:    ICD-10-CM    1. Bipolar affective disorder, rapid cycling ([place])  F31.9         Plan: Patient is to use CBT, mindfulness and coping skills to help manage decrease symptoms associated with their diagnosis.   Continue to take steps toward effective communication in his relationship with is parents to keep stress low.    Long-term goal:   Reduce overall level, frequency, and intensity of the feelings of depression, anxiety evidenced by  decreased irritability, negative self talk, and improved functioning up to 80% of the time as reported by patient for at least 3 consecutive months.    Short-term goal:  Verbally express understanding of the relationship between feelings of depression, anxiety and their impact on thinking patterns and behaviors. Verbalize an understanding of the role that distorted thinking plays in creating fears, excessive worry, and ruminations. 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 Patient to identify and utilize effective communication skills with others       Patient to report any concerns with his medications to his prescribing provider as needed.  Progress: progressing   Anson [place], [facility]

## 2021-02-05 ENCOUNTER — Other Ambulatory Visit: Payer: Self-pay | Admitting: Psychiatry

## 2021-02-05 DIAGNOSIS — M9903 Segmental and somatic dysfunction of lumbar region: Secondary | ICD-10-CM | POA: Diagnosis not present

## 2021-02-05 DIAGNOSIS — M9902 Segmental and somatic dysfunction of thoracic region: Secondary | ICD-10-CM | POA: Diagnosis not present

## 2021-02-05 DIAGNOSIS — L600 Ingrowing nail: Secondary | ICD-10-CM | POA: Diagnosis not present

## 2021-02-05 DIAGNOSIS — F4310 Post-traumatic stress disorder, unspecified: Secondary | ICD-10-CM

## 2021-02-05 DIAGNOSIS — M9907 Segmental and somatic dysfunction of upper extremity: Secondary | ICD-10-CM | POA: Diagnosis not present

## 2021-02-05 DIAGNOSIS — M9905 Segmental and somatic dysfunction of pelvic region: Secondary | ICD-10-CM | POA: Diagnosis not present

## 2021-02-05 DIAGNOSIS — F319 Bipolar disorder, unspecified: Secondary | ICD-10-CM

## 2021-02-05 NOTE — Telephone Encounter (Signed)
Last filled 6/17

## 2021-02-08 DIAGNOSIS — M9903 Segmental and somatic dysfunction of lumbar region: Secondary | ICD-10-CM | POA: Diagnosis not present

## 2021-02-08 DIAGNOSIS — M9905 Segmental and somatic dysfunction of pelvic region: Secondary | ICD-10-CM | POA: Diagnosis not present

## 2021-02-08 DIAGNOSIS — M9907 Segmental and somatic dysfunction of upper extremity: Secondary | ICD-10-CM | POA: Diagnosis not present

## 2021-02-08 DIAGNOSIS — M9902 Segmental and somatic dysfunction of thoracic region: Secondary | ICD-10-CM | POA: Diagnosis not present

## 2021-02-21 ENCOUNTER — Ambulatory Visit: Payer: BC Managed Care – PPO | Admitting: Mental Health

## 2021-03-03 ENCOUNTER — Other Ambulatory Visit: Payer: Self-pay | Admitting: Psychiatry

## 2021-03-03 DIAGNOSIS — F310 Bipolar disorder, current episode hypomanic: Secondary | ICD-10-CM

## 2021-03-04 ENCOUNTER — Other Ambulatory Visit: Payer: Self-pay | Admitting: Internal Medicine

## 2021-03-04 NOTE — Telephone Encounter (Signed)
last apt 10/26/2020 due back 6 weeks. Then had a no show. Nothing scheduled

## 2021-03-06 NOTE — Telephone Encounter (Signed)
Call to RS. I just sent a note about this but make sure his appointment 45 minutes long

## 2021-03-06 NOTE — Telephone Encounter (Signed)
Call to RS

## 2021-03-07 ENCOUNTER — Ambulatory Visit (INDEPENDENT_AMBULATORY_CARE_PROVIDER_SITE_OTHER): Payer: BC Managed Care – PPO | Admitting: Mental Health

## 2021-03-07 ENCOUNTER — Other Ambulatory Visit: Payer: Self-pay

## 2021-03-07 DIAGNOSIS — F319 Bipolar disorder, unspecified: Secondary | ICD-10-CM | POA: Diagnosis not present

## 2021-03-07 NOTE — Progress Notes (Signed)
Psychotherapy Note  Name: Francisco Morgan Date:  03/07/21 MRN: [id number] DOB: 20-Jun-1976 PCP: [name], MD  Time spent: 52 minutes  Treatment: Individual therapy  Mental Status Exam: Appearance:    casual  Behavior:   appropriate  Motor:   WNL  Speech/Language:     Clear, coherent  Affect:   Full range  Mood:   euthymic  Thought process:   normal  Thought content:     WNL  Sensory/Perceptual disturbances:     WNL  Orientation:   x4  Attention:   Good  Concentration:   Good  Memory:   WNL  Fund of knowledge:    WNL  Insight:     good  Judgment:    good  Impulse Control:   good   Reported Symptoms:  Decreased sleep intermittently, some anxiety and irritability, ruminations  Risk Assessment: Danger to Self:  No Self-injurious Behavior: No Danger to Others: No Duty to Warn:no Physical Aggression / Violence:No  Access to Firearms a concern: No  Gang Involvement:No  Patient / guardian was educated about steps to take if suicide or homicide risk level increases between visits: yes While future psychiatric events cannot be accurately predicted, the patient does not currently require acute inpatient psychiatric care and does not currently meet [facility] involuntary commitment criteria.  Medications: Current Outpatient Medications  Medication Sig Dispense Refill   celecoxib (CELEBREX) 200 MG capsule TAKE (1) CAPSULE TWICE DAILY. Annual appt due in November 180 capsule 0   b complex vitamins tablet Take 1 tablet by mouth daily. 100 tablet 3   Cholecalciferol (VITAMIN D) 2000 units CAPS 1 daily 100 capsule 3   clonazePAM (KLONOPIN) 0.5 MG tablet TAKE ONE TABLET BY MOUTH THREE TIMES DAILY AS NEEDED FOR ANXIETY. 60 tablet 2   clotrimazole-betamethasone (LOTRISONE) cream Apply 1 application topically 3 (three) times daily as needed. 30 g 1   divalproex (DEPAKOTE ER) 500 MG 24 hr tablet Take 3 tablets (1,500 mg total) by mouth daily. 1 nightly for 5 nights, then  2 nightly for 5 nights, then 3 nightly 270 tablet 1   esomeprazole (NEXIUM) 40 MG capsule Take 1 capsule (40 mg total) by mouth daily before breakfast. 90 capsule 4   HUMIRA PEN 40 MG/0.4ML PNKT Inject 40 mg into the muscle every 14 (fourteen) days.   1   lamoTRIgine (LAMICTAL) 200 MG tablet TAKE 1 TABLET ONCE DAILY. 90 tablet 1   methylphenidate (RITALIN) 10 MG tablet TAKE 1 TABLET 3 TIMES DAILY WITH MEALS. 270 tablet 0   METHYLPHENIDATE 36 MG PO CR tablet Take 1 tablet (36 mg total) by mouth daily. 90 tablet 0   No current facility-administered medications for this visit.    Allergies  Allergen Reactions   Nsaids Other (See Comments)    Constipation   Subjective:  Patient presents for session on time.  He shared recent events, progress.  He stated that he went to the music festival about 3 weeks ago sharing details.  He stated the event was enjoyable but "took a toll on me".  He shared how he walks several miles due to how the event was organized.  He stated upon returning home he had contracted COVID and endured symptoms associated.  Presently, he continues to feel easily fatigued and a tendency to feel nauseated.  He stated that he is gaining more strength over the last several days.  Ways to cope and care for himself to improve his health where shared.  He stated that he has had less interactions with his parents due to his trip and as well as their being on a trip to himself presently.  He reports no recent relational strain.  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   Interventions: CBT, supportive therapy, problem solving  Diagnoses:    ICD-10-CM   1. Bipolar affective disorder, rapid cycling ([place])  F31.9          Plan: Patient is to use CBT, mindfulness and coping skills to help manage decrease symptoms associated with  their diagnosis.   Continue to take steps toward effective communication in his relationship with is parents to keep stress low.    Long-term goal:   Reduce overall level, frequency, and intensity of the feelings of depression, anxiety evidenced by  decreased irritability, negative self talk, and improved functioning up to 80% of the time as reported by patient for at least 3 consecutive months.    Short-term goal:  Verbally express understanding of the relationship between feelings of depression, anxiety and their impact on thinking patterns and behaviors. Verbalize an understanding of the role that distorted thinking plays in creating fears, excessive worry, and ruminations. 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 Patient to identify and utilize effective communication skills with others       Patient to report any concerns with his medications to his prescribing provider as needed.  Progress: progressing   Anson [place], [facility]

## 2021-03-21 ENCOUNTER — Ambulatory Visit (INDEPENDENT_AMBULATORY_CARE_PROVIDER_SITE_OTHER): Payer: BC Managed Care – PPO | Admitting: Mental Health

## 2021-03-21 ENCOUNTER — Other Ambulatory Visit: Payer: Self-pay

## 2021-03-21 DIAGNOSIS — F319 Bipolar disorder, unspecified: Secondary | ICD-10-CM | POA: Diagnosis not present

## 2021-03-21 NOTE — Progress Notes (Signed)
Psychotherapy Note  Name: Francisco Morgan Date:  03/21/21 MRN: [id number] DOB: 18-May-1976 PCP: [name], MD  Time spent: 54 minutes  Treatment: Individual therapy  Mental Status Exam: Appearance:    casual  Behavior:   appropriate  Motor:   WNL  Speech/Language:     Clear, coherent  Affect:   Full range  Mood:   euthymic  Thought process:   normal  Thought content:     WNL  Sensory/Perceptual disturbances:     WNL  Orientation:   x4  Attention:   Good  Concentration:   Good  Memory:   WNL  Fund of knowledge:    WNL  Insight:     good  Judgment:    good  Impulse Control:   good   Reported Symptoms:  Decreased sleep intermittently, some anxiety and irritability, ruminations  Risk Assessment: Danger to Self:  No Self-injurious Behavior: No Danger to Others: No Duty to Warn:no Physical Aggression / Violence:No  Access to Firearms a concern: No  Gang Involvement:No  Patient / guardian was educated about steps to take if suicide or homicide risk level increases between visits: yes While future psychiatric events cannot be accurately predicted, the patient does not currently require acute inpatient psychiatric care and does not currently meet [facility] involuntary commitment criteria.  Medications: Current Outpatient Medications  Medication Sig Dispense Refill   celecoxib (CELEBREX) 200 MG capsule TAKE (1) CAPSULE TWICE DAILY. Annual appt due in November 180 capsule 0   b complex vitamins tablet Take 1 tablet by mouth daily. 100 tablet 3   Cholecalciferol (VITAMIN D) 2000 units CAPS 1 daily 100 capsule 3   clonazePAM (KLONOPIN) 0.5 MG tablet TAKE ONE TABLET BY MOUTH THREE TIMES DAILY AS NEEDED FOR ANXIETY. 60 tablet 2   clotrimazole-betamethasone (LOTRISONE) cream Apply 1 application topically 3 (three) times daily as needed. 30 g 1   divalproex (DEPAKOTE ER) 500 MG 24 hr tablet Take 3 tablets (1,500 mg total) by mouth daily. 1 nightly for 5 nights, then  2 nightly for 5 nights, then 3 nightly 270 tablet 1   esomeprazole (NEXIUM) 40 MG capsule Take 1 capsule (40 mg total) by mouth daily before breakfast. 90 capsule 4   HUMIRA PEN 40 MG/0.4ML PNKT Inject 40 mg into the muscle every 14 (fourteen) days.   1   lamoTRIgine (LAMICTAL) 200 MG tablet TAKE 1 TABLET ONCE DAILY. 90 tablet 1   methylphenidate (RITALIN) 10 MG tablet TAKE 1 TABLET 3 TIMES DAILY WITH MEALS. 270 tablet 0   METHYLPHENIDATE 36 MG PO CR tablet Take 1 tablet (36 mg total) by mouth daily. 90 tablet 0   No current facility-administered medications for this visit.    Allergies  Allergen Reactions   Nsaids Other (See Comments)    Constipation   Subjective:  Patient presents for session on time.  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  He went on to share some recent experiences, some challenges in working in the profession that he does not enjoy which typically centered around dealing with multiple and redundant requests from some clients.  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  Explored the relationship with his parents where he stated  that they have been getting along well, that they went on a trip recently but that he has had no recent relational strain in the relationship with his mother.  Provide support throughout as patient processed feelings and identified needs.  Interventions: CBT, supportive therapy, problem solving  Diagnoses:    ICD-10-CM   1. Bipolar affective disorder, rapid cycling ([place])  F31.9         Plan: Patient is to use CBT, mindfulness and coping skills to help manage decrease symptoms associated with their diagnosis.   Continue to take steps toward effective communication in his  relationship with is parents to keep stress low.  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   Long-term goal:   Reduce overall level, frequency, and intensity of the feelings of depression, anxiety evidenced by  decreased irritability, negative self talk, and improved functioning up to 80% of the time as reported by patient for at least 3 consecutive months.    Short-term goal:  Verbally express understanding of the relationship between feelings of depression, anxiety and their impact on thinking patterns and behaviors. Verbalize an understanding of the role that distorted thinking plays in creating fears, excessive worry, and ruminations. 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 Patient to identify and utilize effective communication skills with others       Patient to report any concerns with his medications to his prescribing provider as needed.  Progress: progressing   Anson [place], [facility]

## 2021-03-21 NOTE — Telephone Encounter (Signed)
Scheduled for 05/29/21

## 2021-04-04 ENCOUNTER — Other Ambulatory Visit: Payer: Self-pay

## 2021-04-04 ENCOUNTER — Ambulatory Visit (INDEPENDENT_AMBULATORY_CARE_PROVIDER_SITE_OTHER): Payer: BC Managed Care – PPO | Admitting: Mental Health

## 2021-04-04 DIAGNOSIS — F319 Bipolar disorder, unspecified: Secondary | ICD-10-CM

## 2021-04-04 NOTE — Progress Notes (Signed)
Psychotherapy Note  Name: NICKOLAUS LITWAK Date:  04/04/21 MRN: [id number] DOB: 10/16/1975 PCP: [name], MD  Time spent: 56 minutes  Treatment: Individual therapy  Mental Status Exam: Appearance:    casual  Behavior:   appropriate  Motor:   WNL  Speech/Language:     Clear, coherent  Affect:   Full range  Mood:   euthymic  Thought process:   normal  Thought content:     WNL  Sensory/Perceptual disturbances:     WNL  Orientation:   x4  Attention:   Good  Concentration:   Good  Memory:   WNL  Fund of knowledge:    WNL  Insight:     good  Judgment:    good  Impulse Control:   good   Reported Symptoms:  Decreased sleep intermittently, some anxiety and irritability, ruminations  Risk Assessment: Danger to Self:  No Self-injurious Behavior: No Danger to Others: No Duty to Warn:no Physical Aggression / Violence:No  Access to Firearms a concern: No  Gang Involvement:No  Patient / guardian was educated about steps to take if suicide or homicide risk level increases between visits: yes While future psychiatric events cannot be accurately predicted, the patient does not currently require acute inpatient psychiatric care and does not currently meet [facility] involuntary commitment criteria.  Medications: Current Outpatient Medications  Medication Sig Dispense Refill   celecoxib (CELEBREX) 200 MG capsule TAKE (1) CAPSULE TWICE DAILY. Annual appt due in November 180 capsule 0   b complex vitamins tablet Take 1 tablet by mouth daily. 100 tablet 3   Cholecalciferol (VITAMIN D) 2000 units CAPS 1 daily 100 capsule 3   clonazePAM (KLONOPIN) 0.5 MG tablet TAKE ONE TABLET BY MOUTH THREE TIMES DAILY AS NEEDED FOR ANXIETY. 60 tablet 2   clotrimazole-betamethasone (LOTRISONE) cream Apply 1 application topically 3 (three) times daily as needed. 30 g 1   divalproex (DEPAKOTE ER) 500 MG 24 hr tablet Take 3 tablets (1,500 mg total) by mouth daily. 1 nightly for 5 nights, then 2  nightly for 5 nights, then 3 nightly 270 tablet 1   esomeprazole (NEXIUM) 40 MG capsule Take 1 capsule (40 mg total) by mouth daily before breakfast. 90 capsule 4   HUMIRA PEN 40 MG/0.4ML PNKT Inject 40 mg into the muscle every 14 (fourteen) days.   1   lamoTRIgine (LAMICTAL) 200 MG tablet TAKE 1 TABLET ONCE DAILY. 90 tablet 1   methylphenidate (RITALIN) 10 MG tablet TAKE 1 TABLET 3 TIMES DAILY WITH MEALS. 270 tablet 0   METHYLPHENIDATE 36 MG PO CR tablet Take 1 tablet (36 mg total) by mouth daily. 90 tablet 0   No current facility-administered medications for this visit.    Allergies  Allergen Reactions   Nsaids Other (See Comments)    Constipation   Subjective:  Patient presents for session on time.  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  He went on to share some recent experiences, some challenges in working in the profession that he does not enjoy which typically centered around dealing with multiple and redundant requests from some clients.  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  Explored the relationship with his parents where he stated  that they have been getting along well, that they went on a trip recently but that he has had no recent relational strain in the relationship with his mother.  Provide support throughout as patient processed feelings and identified needs.  Interventions: CBT, supportive therapy, problem solving  Diagnoses:    ICD-10-CM   1. Bipolar affective disorder, rapid cycling ([place])  F31.9          Plan: Patient is to use CBT, mindfulness and coping skills to help manage decrease symptoms associated with their diagnosis.   Continue to take steps toward effective communication in his  relationship with is parents to keep stress low.  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   Long-term goal:   Reduce overall level, frequency, and intensity of the feelings of depression, anxiety evidenced by  decreased irritability, negative self talk, and improved functioning up to 80% of the time as reported by patient for at least 3 consecutive months.    Short-term goal:  Verbally express understanding of the relationship between feelings of depression, anxiety and their impact on thinking patterns and behaviors. Verbalize an understanding of the role that distorted thinking plays in creating fears, excessive worry, and ruminations. 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 Patient to identify and utilize effective communication skills with others       Patient to report any concerns with his medications to his prescribing provider as needed.  Progress: progressing   Anson [place], [facility]

## 2021-04-18 ENCOUNTER — Other Ambulatory Visit: Payer: Self-pay

## 2021-04-18 ENCOUNTER — Ambulatory Visit (INDEPENDENT_AMBULATORY_CARE_PROVIDER_SITE_OTHER): Payer: BC Managed Care – PPO | Admitting: Mental Health

## 2021-04-18 DIAGNOSIS — F319 Bipolar disorder, unspecified: Secondary | ICD-10-CM | POA: Diagnosis not present

## 2021-04-18 NOTE — Progress Notes (Signed)
Psychotherapy Note  Name: Francisco Morgan Date:  04/18/21 MRN: [id number] DOB: 08/05/1975 PCP: [name], MD  Time spent: 51 minutes  Treatment: Individual therapy  Mental Status Exam: Appearance:    casual  Behavior:   appropriate  Motor:   WNL  Speech/Language:     Clear, coherent  Affect:   Full range  Mood:   euthymic  Thought process:   normal  Thought content:     WNL  Sensory/Perceptual disturbances:     WNL  Orientation:   x4  Attention:   Good  Concentration:   Good  Memory:   WNL  Fund of knowledge:    WNL  Insight:     good  Judgment:    good  Impulse Control:   good   Reported Symptoms:  Decreased sleep intermittently, some anxiety and irritability, ruminations  Risk Assessment: Danger to Self:  No Self-injurious Behavior: No Danger to Others: No Duty to Warn:no Physical Aggression / Violence:No  Access to Firearms a concern: No  Gang Involvement:No  Patient / guardian was educated about steps to take if suicide or homicide risk level increases between visits: yes While future psychiatric events cannot be accurately predicted, the patient does not currently require acute inpatient psychiatric care and does not currently meet [facility] involuntary commitment criteria.  Medications: Current Outpatient Medications  Medication Sig Dispense Refill   celecoxib (CELEBREX) 200 MG capsule TAKE (1) CAPSULE TWICE DAILY. Annual appt due in November 180 capsule 0   b complex vitamins tablet Take 1 tablet by mouth daily. 100 tablet 3   Cholecalciferol (VITAMIN D) 2000 units CAPS 1 daily 100 capsule 3   clonazePAM (KLONOPIN) 0.5 MG tablet TAKE ONE TABLET BY MOUTH THREE TIMES DAILY AS NEEDED FOR ANXIETY. 60 tablet 2   clotrimazole-betamethasone (LOTRISONE) cream Apply 1 application topically 3 (three) times daily as needed. 30 g 1   divalproex (DEPAKOTE ER) 500 MG 24 hr tablet Take 3 tablets (1,500 mg total) by mouth daily. 1 nightly for 5 nights, then  2 nightly for 5 nights, then 3 nightly 270 tablet 1   esomeprazole (NEXIUM) 40 MG capsule Take 1 capsule (40 mg total) by mouth daily before breakfast. 90 capsule 4   HUMIRA PEN 40 MG/0.4ML PNKT Inject 40 mg into the muscle every 14 (fourteen) days.   1   lamoTRIgine (LAMICTAL) 200 MG tablet TAKE 1 TABLET ONCE DAILY. 90 tablet 1   methylphenidate (RITALIN) 10 MG tablet TAKE 1 TABLET 3 TIMES DAILY WITH MEALS. 270 tablet 0   METHYLPHENIDATE 36 MG PO CR tablet Take 1 tablet (36 mg total) by mouth daily. 90 tablet 0   No current facility-administered medications for this visit.    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  Coping with some pain levels due to a history of injuries can sometimes be a barrier with him getting enough of the work done with which he would like.  Family relationships were assessed.  Getting along well with his parents currently no recent relational strain with  his mother which has been helpful for maintaining lower stress levels.  Ways to cope, care for himself were explored collaboratively where he values his time on vacation over the past few months, sharing some experiences as well as other potential plans in the coming months with which he would like to engage.  Interventions: CBT, supportive therapy, problem solving  Diagnoses:    ICD-10-CM   1. Bipolar affective disorder, rapid cycling ([place])  F31.9           Plan: Patient is to use CBT, mindfulness and coping skills to help manage decrease symptoms associated with their diagnosis.    Continue to take steps toward effective communication in his relationship with is parents to keep stress low.  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   Long-term goal:   Reduce overall level, frequency, and intensity of the feelings of depression, anxiety evidenced by  decreased irritability, negative self talk, and improved functioning up to 80% of the time as reported by patient for at least 3 consecutive months.    Short-term goal:  Verbally express understanding of the relationship between feelings of depression, anxiety and their impact on thinking patterns and behaviors. Verbalize an understanding of the role that distorted thinking plays in creating fears, excessive worry, and ruminations. 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 Patient to identify and utilize effective communication skills with others       Patient to report any concerns with his medications to his prescribing provider as needed.  Progress: progressing   Anson [place], [facility]

## 2021-04-25 ENCOUNTER — Other Ambulatory Visit: Payer: Self-pay | Admitting: Psychiatry

## 2021-04-25 DIAGNOSIS — F902 Attention-deficit hyperactivity disorder, combined type: Secondary | ICD-10-CM

## 2021-04-29 ENCOUNTER — Telehealth: Payer: Self-pay | Admitting: Psychiatry

## 2021-04-29 DIAGNOSIS — F4310 Post-traumatic stress disorder, unspecified: Secondary | ICD-10-CM

## 2021-04-29 DIAGNOSIS — F319 Bipolar disorder, unspecified: Secondary | ICD-10-CM

## 2021-05-02 ENCOUNTER — Ambulatory Visit (INDEPENDENT_AMBULATORY_CARE_PROVIDER_SITE_OTHER): Payer: BC Managed Care – PPO | Admitting: Mental Health

## 2021-05-02 ENCOUNTER — Other Ambulatory Visit: Payer: Self-pay

## 2021-05-02 DIAGNOSIS — F319 Bipolar disorder, unspecified: Secondary | ICD-10-CM | POA: Diagnosis not present

## 2021-05-02 NOTE — Progress Notes (Signed)
Psychotherapy Note  Name: Francisco Morgan Date:  05/02/21 MRN: [id number] DOB: March 13, 1976 PCP: [name], MD  Time spent: 50 minutes  Treatment: Individual therapy  Mental Status Exam: Appearance:    casual  Behavior:   appropriate  Motor:   WNL  Speech/Language:     Clear, coherent  Affect:   Full range  Mood:   euthymic  Thought process:   normal  Thought content:     WNL  Sensory/Perceptual disturbances:     WNL  Orientation:   x4  Attention:   Good  Concentration:   Good  Memory:   WNL  Fund of knowledge:    WNL  Insight:     good  Judgment:    good  Impulse Control:   good   Reported Symptoms:  Decreased sleep intermittently, some anxiety and irritability, ruminations  Risk Assessment: Danger to Self:  No Self-injurious Behavior: No Danger to Others: No Duty to Warn:no Physical Aggression / Violence:No  Access to Firearms a concern: No  Gang Involvement:No  Patient / guardian was educated about steps to take if suicide or homicide risk level increases between visits: yes While future psychiatric events cannot be accurately predicted, the patient does not currently require acute inpatient psychiatric care and does not currently meet [facility] involuntary commitment criteria.  Medications: Current Outpatient Medications  Medication Sig Dispense Refill   celecoxib (CELEBREX) 200 MG capsule TAKE (1) CAPSULE TWICE DAILY. Annual appt due in November 180 capsule 0   b complex vitamins tablet Take 1 tablet by mouth daily. 100 tablet 3   Cholecalciferol (VITAMIN D) 2000 units CAPS 1 daily 100 capsule 3   clonazePAM (KLONOPIN) 0.5 MG tablet TAKE ONE TABLET BY MOUTH THREE TIMES DAILY AS NEEDED FOR ANXIETY. 60 tablet 2   clotrimazole-betamethasone (LOTRISONE) cream Apply 1 application topically 3 (three) times daily as needed. 30 g 1   divalproex (DEPAKOTE ER) 500 MG 24 hr tablet Take 3 tablets (1,500 mg total) by mouth daily. 1 nightly for 5 nights, then  2 nightly for 5 nights, then 3 nightly 270 tablet 1   esomeprazole (NEXIUM) 40 MG capsule Take 1 capsule (40 mg total) by mouth daily before breakfast. 90 capsule 4   HUMIRA PEN 40 MG/0.4ML PNKT Inject 40 mg into the muscle every 14 (fourteen) days.   1   lamoTRIgine (LAMICTAL) 200 MG tablet TAKE 1 TABLET ONCE DAILY. 90 tablet 1   methylphenidate (RITALIN) 10 MG tablet TAKE 1 TABLET 3 TIMES DAILY WITH MEALS. 270 tablet 0   METHYLPHENIDATE 36 MG PO CR tablet Take 1 tablet (36 mg total) by mouth daily. 90 tablet 0   No current facility-administered medications for this visit.    Allergies  Allergen Reactions   Nsaids Other (See Comments)    Constipation   Subjective:  Patient presents for session on time.  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  He shared how his parents returned from their vacation, shared interactions, getting along well overall, less relational distress between him and his mother specifically.  Facilitated his identifying ways he has been able to manage his feelings as well as specifically communications with his mother when he tends to get upset where he was able to identify some specifics in today's session that have occurred recently.  Ways to cope and care for himself, maintaining adequate rest was identified as a need.  Interventions: Motivational interviewing, supportive therapy, problem solving  Diagnoses:    ICD-10-CM   1. Bipolar affective disorder, rapid cycling ([place])  F31.9          Plan: Patient is to use CBT, mindfulness and coping skills to help manage decrease symptoms associated with their diagnosis.   Continue to take steps toward effective communication in his relationship with is parents to keep stress low.  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   Long-term goal:   Reduce overall level, frequency, and  intensity of the feelings of depression, anxiety evidenced by  decreased irritability, negative self talk, and improved functioning up to 80% of the time as reported by patient for at least 3 consecutive months.    Short-term goal:  Verbally express understanding of the relationship between feelings of depression, anxiety and their impact on thinking patterns and behaviors. Verbalize an understanding of the role that distorted thinking plays in creating fears, excessive worry, and ruminations. 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 Patient to identify and utilize effective communication skills with others       Patient to report any concerns with his medications to his prescribing provider as needed.  Progress: progressing   Anson [place], [facility]

## 2021-05-02 NOTE — Telephone Encounter (Signed)
Pt mentioned 90 day supply for all maintenance meds. Clonazepam was only 30 day. Pharmacy trying to get information on 90 days. [facility].

## 2021-05-02 NOTE — Telephone Encounter (Signed)
Do you want him to have 90 day for Klonopin?

## 2021-05-09 DIAGNOSIS — M9905 Segmental and somatic dysfunction of pelvic region: Secondary | ICD-10-CM | POA: Diagnosis not present

## 2021-05-09 DIAGNOSIS — M9907 Segmental and somatic dysfunction of upper extremity: Secondary | ICD-10-CM | POA: Diagnosis not present

## 2021-05-09 DIAGNOSIS — M9903 Segmental and somatic dysfunction of lumbar region: Secondary | ICD-10-CM | POA: Diagnosis not present

## 2021-05-09 DIAGNOSIS — M9902 Segmental and somatic dysfunction of thoracic region: Secondary | ICD-10-CM | POA: Diagnosis not present

## 2021-05-13 DIAGNOSIS — M9903 Segmental and somatic dysfunction of lumbar region: Secondary | ICD-10-CM | POA: Diagnosis not present

## 2021-05-13 DIAGNOSIS — M9902 Segmental and somatic dysfunction of thoracic region: Secondary | ICD-10-CM | POA: Diagnosis not present

## 2021-05-13 DIAGNOSIS — M9905 Segmental and somatic dysfunction of pelvic region: Secondary | ICD-10-CM | POA: Diagnosis not present

## 2021-05-13 DIAGNOSIS — M9907 Segmental and somatic dysfunction of upper extremity: Secondary | ICD-10-CM | POA: Diagnosis not present

## 2021-05-16 ENCOUNTER — Other Ambulatory Visit: Payer: Self-pay

## 2021-05-16 ENCOUNTER — Ambulatory Visit (INDEPENDENT_AMBULATORY_CARE_PROVIDER_SITE_OTHER): Payer: BC Managed Care – PPO | Admitting: Mental Health

## 2021-05-16 DIAGNOSIS — M9905 Segmental and somatic dysfunction of pelvic region: Secondary | ICD-10-CM | POA: Diagnosis not present

## 2021-05-16 DIAGNOSIS — M9903 Segmental and somatic dysfunction of lumbar region: Secondary | ICD-10-CM | POA: Diagnosis not present

## 2021-05-16 DIAGNOSIS — F319 Bipolar disorder, unspecified: Secondary | ICD-10-CM | POA: Diagnosis not present

## 2021-05-16 DIAGNOSIS — M9907 Segmental and somatic dysfunction of upper extremity: Secondary | ICD-10-CM | POA: Diagnosis not present

## 2021-05-16 DIAGNOSIS — M9902 Segmental and somatic dysfunction of thoracic region: Secondary | ICD-10-CM | POA: Diagnosis not present

## 2021-05-16 NOTE — Progress Notes (Signed)
Psychotherapy Note  Name: Francisco Morgan Date:  05/16/21 MRN: [id number] DOB: Sep 24, 1975 PCP: [name], MD  Time spent: 54 minutes  Treatment: Individual therapy  Mental Status Exam: Appearance:    casual  Behavior:   appropriate  Motor:   WNL  Speech/Language:     Clear, coherent  Affect:   Full range  Mood:   euthymic  Thought process:   normal  Thought content:     WNL  Sensory/Perceptual disturbances:     WNL  Orientation:   x4  Attention:   Good  Concentration:   Good  Memory:   WNL  Fund of knowledge:    WNL  Insight:     good  Judgment:    good  Impulse Control:   good   Reported Symptoms:  Decreased sleep intermittently, some anxiety and irritability, ruminations  Risk Assessment: Danger to Self:  No Self-injurious Behavior: No Danger to Others: No Duty to Warn:no Physical Aggression / Violence:No  Access to Firearms a concern: No  Gang Involvement:No  Patient / guardian was educated about steps to take if suicide or homicide risk level increases between visits: yes While future psychiatric events cannot be accurately predicted, the patient does not currently require acute inpatient psychiatric care and does not currently meet [facility] involuntary commitment criteria.  Medications: Current Outpatient Medications  Medication Sig Dispense Refill   celecoxib (CELEBREX) 200 MG capsule TAKE (1) CAPSULE TWICE DAILY. Annual appt due in November 180 capsule 0   b complex vitamins tablet Take 1 tablet by mouth daily. 100 tablet 3   Cholecalciferol (VITAMIN D) 2000 units CAPS 1 daily 100 capsule 3   clonazePAM (KLONOPIN) 0.5 MG tablet TAKE ONE TABLET BY MOUTH THREE TIMES DAILY AS NEEDED FOR ANXIETY. 60 tablet 2   clotrimazole-betamethasone (LOTRISONE) cream Apply 1 application topically 3 (three) times daily as needed. 30 g 1   divalproex (DEPAKOTE ER) 500 MG 24 hr tablet Take 3 tablets (1,500 mg total) by mouth daily. 1 nightly for 5 nights, then  2 nightly for 5 nights, then 3 nightly 270 tablet 1   esomeprazole (NEXIUM) 40 MG capsule Take 1 capsule (40 mg total) by mouth daily before breakfast. 90 capsule 4   HUMIRA PEN 40 MG/0.4ML PNKT Inject 40 mg into the muscle every 14 (fourteen) days.   1   lamoTRIgine (LAMICTAL) 200 MG tablet TAKE 1 TABLET ONCE DAILY. 90 tablet 1   methylphenidate (RITALIN) 10 MG tablet TAKE 1 TABLET 3 TIMES DAILY WITH MEALS. 270 tablet 0   METHYLPHENIDATE 36 MG PO CR tablet Take 1 tablet (36 mg total) by mouth daily. 90 tablet 0   No current facility-administered medications for this visit.    Allergies  Allergen Reactions   Nsaids Other (See Comments)    Constipation   Subjective:  Patient presents for session on time.  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  He has a sense of overall satisfaction with continuing with some real estate work, however, verbalizes some frustration with the company with which he is employed.  Went on to share status of family relationships, more history related to his father specifically, paternal grandparents.  Patient verbalizes the recognition he has of his family history, the hardships his father had to endure growing up and how past experiences continue to play in his current relationship  with his father.  He stated they were relatively close, more now than in the past, reports getting along well overall with his mother recently which is where there can be more relational strain.  Ways to cope and care for himself reviewed, explored.  Patient continuing to work on his parents property, reports getting adequate rest and trying to keep that a focus as he can be physically exhausting.  Interventions: Motivational interviewing, supportive therapy, problem solving  Diagnoses:    ICD-10-CM   1. Bipolar affective disorder, rapid cycling ([place])   F31.9           Plan: Patient is to use CBT, mindfulness and coping skills to help manage decrease symptoms associated with their diagnosis.   Continue to take steps toward effective communication in his relationship with is parents to keep stress low.  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   Long-term goal:   Reduce overall level, frequency, and intensity of the feelings of depression, anxiety evidenced by  decreased irritability, negative self talk, and improved functioning up to 80% of the time as reported by patient for at least 3 consecutive months.    Short-term goal:  Verbally express understanding of the relationship between feelings of depression, anxiety and their impact on thinking patterns and behaviors. Verbalize an understanding of the role that distorted thinking plays in creating fears, excessive worry, and ruminations. 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 Patient to identify and utilize effective communication skills with others       Patient to report any concerns with his medications to his prescribing provider as needed.  Progress: progressing   Anson [place], [facility]

## 2021-05-21 DIAGNOSIS — Z1589 Genetic susceptibility to other disease: Secondary | ICD-10-CM | POA: Diagnosis not present

## 2021-05-21 DIAGNOSIS — L400 Psoriasis vulgaris: Secondary | ICD-10-CM | POA: Diagnosis not present

## 2021-05-21 DIAGNOSIS — M1A09X0 Idiopathic chronic gout, multiple sites, without tophus (tophi): Secondary | ICD-10-CM | POA: Diagnosis not present

## 2021-05-21 DIAGNOSIS — M21621 Bunionette of right foot: Secondary | ICD-10-CM | POA: Diagnosis not present

## 2021-05-21 DIAGNOSIS — L4059 Other psoriatic arthropathy: Secondary | ICD-10-CM | POA: Diagnosis not present

## 2021-05-21 DIAGNOSIS — M21622 Bunionette of left foot: Secondary | ICD-10-CM | POA: Diagnosis not present

## 2021-05-22 DIAGNOSIS — M9902 Segmental and somatic dysfunction of thoracic region: Secondary | ICD-10-CM | POA: Diagnosis not present

## 2021-05-22 DIAGNOSIS — M9907 Segmental and somatic dysfunction of upper extremity: Secondary | ICD-10-CM | POA: Diagnosis not present

## 2021-05-22 DIAGNOSIS — M9905 Segmental and somatic dysfunction of pelvic region: Secondary | ICD-10-CM | POA: Diagnosis not present

## 2021-05-22 DIAGNOSIS — M9903 Segmental and somatic dysfunction of lumbar region: Secondary | ICD-10-CM | POA: Diagnosis not present

## 2021-05-23 ENCOUNTER — Other Ambulatory Visit: Payer: Self-pay | Admitting: Psychiatry

## 2021-05-23 DIAGNOSIS — F319 Bipolar disorder, unspecified: Secondary | ICD-10-CM

## 2021-05-29 ENCOUNTER — Ambulatory Visit: Payer: BC Managed Care – PPO | Admitting: Psychiatry

## 2021-05-29 ENCOUNTER — Other Ambulatory Visit: Payer: Self-pay

## 2021-05-30 ENCOUNTER — Ambulatory Visit (INDEPENDENT_AMBULATORY_CARE_PROVIDER_SITE_OTHER): Payer: BC Managed Care – PPO | Admitting: Mental Health

## 2021-05-30 DIAGNOSIS — F319 Bipolar disorder, unspecified: Secondary | ICD-10-CM

## 2021-05-30 NOTE — Progress Notes (Signed)
Psychotherapy Note  Name: Francisco Morgan Date:  05/30/21 MRN: [id number] DOB: July 04, 1976 PCP: [name], MD  Time spent: 50 minutes  Treatment: Individual therapy  Mental Status Exam: Appearance:    casual  Behavior:   appropriate  Motor:   WNL  Speech/Language:     Clear, coherent  Affect:   Full range  Mood:   euthymic  Thought process:   normal  Thought content:     WNL  Sensory/Perceptual disturbances:     WNL  Orientation:   x4  Attention:   Good  Concentration:   Good  Memory:   WNL  Fund of knowledge:    WNL  Insight:     good  Judgment:    good  Impulse Control:   good   Reported Symptoms:  Decreased sleep intermittently, some anxiety and irritability, ruminations  Risk Assessment: Danger to Self:  No Self-injurious Behavior: No Danger to Others: No Duty to Warn:no Physical Aggression / Violence:No  Access to Firearms a concern: No  Gang Involvement:No  Patient / guardian was educated about steps to take if suicide or homicide risk level increases between visits: yes While future psychiatric events cannot be accurately predicted, the patient does not currently require acute inpatient psychiatric care and does not currently meet [facility] involuntary commitment criteria.  Medications: Current Outpatient Medications  Medication Sig Dispense Refill   celecoxib (CELEBREX) 200 MG capsule TAKE (1) CAPSULE TWICE DAILY. Annual appt due in November 180 capsule 0   b complex vitamins tablet Take 1 tablet by mouth daily. 100 tablet 3   Cholecalciferol (VITAMIN D) 2000 units CAPS 1 daily 100 capsule 3   clonazePAM (KLONOPIN) 0.5 MG tablet TAKE ONE TABLET BY MOUTH THREE TIMES DAILY AS NEEDED FOR ANXIETY. 60 tablet 2   clotrimazole-betamethasone (LOTRISONE) cream Apply 1 application topically 3 (three) times daily as needed. 30 g 1   divalproex (DEPAKOTE ER) 500 MG 24 hr tablet Take 3 tablets (1,500 mg total) by mouth daily. 90 tablet 0   esomeprazole  (NEXIUM) 40 MG capsule Take 1 capsule (40 mg total) by mouth daily before breakfast. 90 capsule 4   HUMIRA PEN 40 MG/0.4ML PNKT Inject 40 mg into the muscle every 14 (fourteen) days.   1   lamoTRIgine (LAMICTAL) 200 MG tablet TAKE 1 TABLET ONCE DAILY. 90 tablet 1   methylphenidate (RITALIN) 10 MG tablet TAKE 1 TABLET 3 TIMES DAILY WITH MEALS. 270 tablet 0   METHYLPHENIDATE 36 MG PO CR tablet Take 1 tablet (36 mg total) by mouth daily. 90 tablet 0   No current facility-administered medications for this visit.    Allergies  Allergen Reactions   Nsaids Other (See Comments)    Constipation   Subjective:  Patient presents for session on time.  Assessed progress.  He shared how he has been coping with low motivation recently, going on to share how when he is working on projects with the remodeling, that he may not have the same perseverance that he had before.  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  He shared how he is often frustrated with the and acquitted process his company currently has with document management.  He identified, on a positive note, that he has a lot of business he is about to close on; encouraged patient to remind himself of taking the steps as he has been able to adjust, balancing completing the home  remodeling projects for his parents as well as keeping up with the real estate activities.    Interventions: Motivational interviewing, supportive therapy, problem solving  Diagnoses:    ICD-10-CM   1. Bipolar affective disorder, rapid cycling ([place])  F31.9         Plan: Patient is to use CBT, mindfulness and coping skills to help manage decrease symptoms associated with their diagnosis.   Continue to take steps toward effective communication in his relationship with is parents to keep stress low.  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   Long-term goal:   Reduce overall level, frequency, and intensity of the feelings of depression, anxiety evidenced by  decreased irritability, negative self talk, and improved functioning up to 80% of the time as reported by patient for at least 3 consecutive months.    Short-term goal:  Verbally express understanding of the relationship between feelings of depression, anxiety and their impact on thinking patterns and behaviors. Verbalize an understanding of the role that distorted thinking plays in creating fears, excessive worry, and ruminations. 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 Patient to identify and utilize effective communication skills with others       Patient to report any concerns with his medications to his prescribing provider as needed.  Progress: progressing   Anson [place], [facility]

## 2021-05-31 DIAGNOSIS — M9905 Segmental and somatic dysfunction of pelvic region: Secondary | ICD-10-CM | POA: Diagnosis not present

## 2021-05-31 DIAGNOSIS — M9907 Segmental and somatic dysfunction of upper extremity: Secondary | ICD-10-CM | POA: Diagnosis not present

## 2021-05-31 DIAGNOSIS — M9902 Segmental and somatic dysfunction of thoracic region: Secondary | ICD-10-CM | POA: Diagnosis not present

## 2021-05-31 DIAGNOSIS — M9903 Segmental and somatic dysfunction of lumbar region: Secondary | ICD-10-CM | POA: Diagnosis not present

## 2021-06-05 DIAGNOSIS — M9903 Segmental and somatic dysfunction of lumbar region: Secondary | ICD-10-CM | POA: Diagnosis not present

## 2021-06-05 DIAGNOSIS — M9905 Segmental and somatic dysfunction of pelvic region: Secondary | ICD-10-CM | POA: Diagnosis not present

## 2021-06-05 DIAGNOSIS — M9907 Segmental and somatic dysfunction of upper extremity: Secondary | ICD-10-CM | POA: Diagnosis not present

## 2021-06-05 DIAGNOSIS — M9902 Segmental and somatic dysfunction of thoracic region: Secondary | ICD-10-CM | POA: Diagnosis not present

## 2021-06-12 DIAGNOSIS — M9907 Segmental and somatic dysfunction of upper extremity: Secondary | ICD-10-CM | POA: Diagnosis not present

## 2021-06-12 DIAGNOSIS — M9902 Segmental and somatic dysfunction of thoracic region: Secondary | ICD-10-CM | POA: Diagnosis not present

## 2021-06-12 DIAGNOSIS — M9905 Segmental and somatic dysfunction of pelvic region: Secondary | ICD-10-CM | POA: Diagnosis not present

## 2021-06-12 DIAGNOSIS — M9903 Segmental and somatic dysfunction of lumbar region: Secondary | ICD-10-CM | POA: Diagnosis not present

## 2021-06-13 ENCOUNTER — Ambulatory Visit (INDEPENDENT_AMBULATORY_CARE_PROVIDER_SITE_OTHER): Payer: BC Managed Care – PPO | Admitting: Mental Health

## 2021-06-13 ENCOUNTER — Other Ambulatory Visit: Payer: Self-pay

## 2021-06-13 DIAGNOSIS — F319 Bipolar disorder, unspecified: Secondary | ICD-10-CM

## 2021-06-13 NOTE — Progress Notes (Signed)
Psychotherapy Note  Name: Francisco Morgan Date:  06/13/21 MRN: [id number] DOB: 1975/08/15 PCP: [name], MD  Time spent: 56 minutes  Treatment: Individual therapy  Mental Status Exam: Appearance:    casual  Behavior:   appropriate  Motor:   WNL  Speech/Language:     Clear, coherent  Affect:   Full range  Mood:   euthymic  Thought process:   normal  Thought content:     WNL  Sensory/Perceptual disturbances:     WNL  Orientation:   x4  Attention:   Good  Concentration:   Good  Memory:   WNL  Fund of knowledge:    WNL  Insight:     good  Judgment:    good  Impulse Control:   good   Reported Symptoms:  Decreased sleep intermittently, some anxiety and irritability, ruminations  Risk Assessment: Danger to Self:  No Self-injurious Behavior: No Danger to Others: No Duty to Warn:no Physical Aggression / Violence:No  Access to Firearms a concern: No  Gang Involvement:No  Patient / guardian was educated about steps to take if suicide or homicide risk level increases between visits: yes While future psychiatric events cannot be accurately predicted, the patient does not currently require acute inpatient psychiatric care and does not currently meet [facility] involuntary commitment criteria.  Medications: Current Outpatient Medications  Medication Sig Dispense Refill   celecoxib (CELEBREX) 200 MG capsule TAKE (1) CAPSULE TWICE DAILY. Annual appt due in November 180 capsule 0   b complex vitamins tablet Take 1 tablet by mouth daily. 100 tablet 3   Cholecalciferol (VITAMIN D) 2000 units CAPS 1 daily 100 capsule 3   clonazePAM (KLONOPIN) 0.5 MG tablet TAKE ONE TABLET BY MOUTH THREE TIMES DAILY AS NEEDED FOR ANXIETY. 60 tablet 2   clotrimazole-betamethasone (LOTRISONE) cream Apply 1 application topically 3 (three) times daily as needed. 30 g 1   divalproex (DEPAKOTE ER) 500 MG 24 hr tablet Take 3 tablets (1,500 mg total) by mouth daily. 90 tablet 0   esomeprazole  (NEXIUM) 40 MG capsule Take 1 capsule (40 mg total) by mouth daily before breakfast. 90 capsule 4   HUMIRA PEN 40 MG/0.4ML PNKT Inject 40 mg into the muscle every 14 (fourteen) days.   1   lamoTRIgine (LAMICTAL) 200 MG tablet TAKE 1 TABLET ONCE DAILY. 90 tablet 1   methylphenidate (RITALIN) 10 MG tablet TAKE 1 TABLET 3 TIMES DAILY WITH MEALS. 270 tablet 0   METHYLPHENIDATE 36 MG PO CR tablet Take 1 tablet (36 mg total) by mouth daily. 90 tablet 0   No current facility-administered medications for this visit.    Allergies  Allergen Reactions   Nsaids Other (See Comments)    Constipation   Subjective:  Patient presents for session.  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  Continue to review and process thoughts and feelings related to history with his last girlfriend which was a few years ago.  Trying to fully understand and manage his mental health issues during that time and since was shared.  Currently, he reports some increased depression but had difficulty articulating specific reasons.  We encouraged him to further identify between sessions for discussion next visit.  He denied needing additional medication support at this time from Dr. Clovis Pu, he plans to make his next med management appointment.  Interventions: Motivational interviewing, supportive therapy, problem solving  Diagnoses:    ICD-10-CM   1.  Bipolar affective disorder, rapid cycling ([place])  F31.9        Plan: Patient is to use CBT, mindfulness and coping skills to help manage decrease symptoms associated with their diagnosis.   Continue to take steps toward effective communication in his relationship with is parents to keep stress low.  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   Long-term goal:   Reduce overall level, frequency, and intensity of the feelings of depression, anxiety evidenced by  decreased  irritability, negative self talk, and improved functioning up to 80% of the time as reported by patient for at least 3 consecutive months.    Short-term goal:  Verbally express understanding of the relationship between feelings of depression, anxiety and their impact on thinking patterns and behaviors. Verbalize an understanding of the role that distorted thinking plays in creating fears, excessive worry, and ruminations. 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 Patient to identify and utilize effective communication skills with others       Patient to report any concerns with his medications to his prescribing provider as needed.  Progress: progressing   Anson [place], [facility]

## 2021-06-18 DIAGNOSIS — R2689 Other abnormalities of gait and mobility: Secondary | ICD-10-CM | POA: Diagnosis not present

## 2021-06-27 ENCOUNTER — Other Ambulatory Visit: Payer: Self-pay

## 2021-06-27 ENCOUNTER — Ambulatory Visit (INDEPENDENT_AMBULATORY_CARE_PROVIDER_SITE_OTHER): Payer: BC Managed Care – PPO | Admitting: Mental Health

## 2021-06-27 DIAGNOSIS — F319 Bipolar disorder, unspecified: Secondary | ICD-10-CM | POA: Diagnosis not present

## 2021-06-27 DIAGNOSIS — M9905 Segmental and somatic dysfunction of pelvic region: Secondary | ICD-10-CM | POA: Diagnosis not present

## 2021-06-27 DIAGNOSIS — M9903 Segmental and somatic dysfunction of lumbar region: Secondary | ICD-10-CM | POA: Diagnosis not present

## 2021-06-27 DIAGNOSIS — M9907 Segmental and somatic dysfunction of upper extremity: Secondary | ICD-10-CM | POA: Diagnosis not present

## 2021-06-27 DIAGNOSIS — M9902 Segmental and somatic dysfunction of thoracic region: Secondary | ICD-10-CM | POA: Diagnosis not present

## 2021-06-30 ENCOUNTER — Other Ambulatory Visit: Payer: Self-pay | Admitting: Psychiatry

## 2021-07-01 DIAGNOSIS — M9907 Segmental and somatic dysfunction of upper extremity: Secondary | ICD-10-CM | POA: Diagnosis not present

## 2021-07-01 DIAGNOSIS — M9902 Segmental and somatic dysfunction of thoracic region: Secondary | ICD-10-CM | POA: Diagnosis not present

## 2021-07-01 DIAGNOSIS — M9903 Segmental and somatic dysfunction of lumbar region: Secondary | ICD-10-CM | POA: Diagnosis not present

## 2021-07-01 DIAGNOSIS — M9905 Segmental and somatic dysfunction of pelvic region: Secondary | ICD-10-CM | POA: Diagnosis not present

## 2021-07-01 NOTE — Progress Notes (Signed)
Psychotherapy Note  Name: Francisco Morgan Date:  06/27/21 MRN: [id number] DOB: 28-Sep-1975 PCP: [name], MD  Time spent: 55 minutes  Treatment: Individual therapy  Mental Status Exam: Appearance:    casual  Behavior:   appropriate  Motor:   WNL  Speech/Language:     Clear, coherent  Affect:   Full range  Mood:   euthymic  Thought process:   normal  Thought content:     WNL  Sensory/Perceptual disturbances:     WNL  Orientation:   x4  Attention:   Good  Concentration:   Good  Memory:   WNL  Fund of knowledge:    WNL  Insight:     good  Judgment:    good  Impulse Control:   good   Reported Symptoms:  Decreased sleep intermittently, some anxiety and irritability, ruminations  Risk Assessment: Danger to Self:  No Self-injurious Behavior: No Danger to Others: No Duty to Warn:no Physical Aggression / Violence:No  Access to Firearms a concern: No  Gang Involvement:No  Patient / guardian was educated about steps to take if suicide or homicide risk level increases between visits: yes While future psychiatric events cannot be accurately predicted, the patient does not currently require acute inpatient psychiatric care and does not currently meet [facility] involuntary commitment criteria.  Medications: Current Outpatient Medications  Medication Sig Dispense Refill   celecoxib (CELEBREX) 200 MG capsule TAKE (1) CAPSULE TWICE DAILY. Annual appt due in November 180 capsule 0   b complex vitamins tablet Take 1 tablet by mouth daily. 100 tablet 3   Cholecalciferol (VITAMIN D) 2000 units CAPS 1 daily 100 capsule 3   clonazePAM (KLONOPIN) 0.5 MG tablet TAKE ONE TABLET BY MOUTH THREE TIMES DAILY AS NEEDED FOR ANXIETY. 60 tablet 2   clotrimazole-betamethasone (LOTRISONE) cream Apply 1 application topically 3 (three) times daily as needed. 30 g 1   divalproex (DEPAKOTE ER) 500 MG 24 hr tablet Take 3 tablets (1,500 mg total) by mouth daily. 90 tablet 0   esomeprazole  (NEXIUM) 40 MG capsule Take 1 capsule (40 mg total) by mouth daily before breakfast. 90 capsule 4   HUMIRA PEN 40 MG/0.4ML PNKT Inject 40 mg into the muscle every 14 (fourteen) days.   1   lamoTRIgine (LAMICTAL) 200 MG tablet TAKE 1 TABLET ONCE DAILY. 90 tablet 1   methylphenidate (RITALIN) 10 MG tablet TAKE 1 TABLET 3 TIMES DAILY WITH MEALS. 270 tablet 0   METHYLPHENIDATE 36 MG PO CR tablet Take 1 tablet (36 mg total) by mouth daily. 90 tablet 0   No current facility-administered medications for this visit.    Allergies  Allergen Reactions   Nsaids Other (See Comments)    Constipation   Subjective:  Patient presents for session.  Patient shared recent efforts and the remodeling project he continues to work on at one of his parents properties.  He went on to share in some detail the ongoing experiences of living with his parents.  He shared how he can often be somewhat stressful, a recent incident of his throwing away a bag of trash from his room, later to find that his mother recognized the bag being placed in the recycle bin at the street incorrectly.  He stated that she went through his items and proceeded to chastise him about making this error due to some of the items not being able to be recycled.  Patient stated this is 1 example of many relating to his mother's  tendency to be "obsessive about some things".  Patient shared how he typically tries to limit his interaction with his parents, primarily his mother due to it being stressful to 1 degree or another.  Facilitated patient identifying needs, where he has found it helpful to sometimes stay at the other property for a few days to have some separation which he plans to follow through with as needed.    Interventions: Motivational interviewing, supportive therapy, problem solving  Diagnoses:    ICD-10-CM   1. Bipolar affective disorder, rapid cycling ([place])  F31.9         Plan: Patient is to use CBT, mindfulness and coping skills  to help manage decrease symptoms associated with their diagnosis.   Continue to take steps toward effective communication in his relationship with is parents to keep stress low.  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   Long-term goal:   Reduce overall level, frequency, and intensity of the feelings of depression, anxiety evidenced by  decreased irritability, negative self talk, and improved functioning up to 80% of the time as reported by patient for at least 3 consecutive months.    Short-term goal:  Verbally express understanding of the relationship between feelings of depression, anxiety and their impact on thinking patterns and behaviors. Verbalize an understanding of the role that distorted thinking plays in creating fears, excessive worry, and ruminations. 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 Patient to identify and utilize effective communication skills with others       Patient to report any concerns with his medications to his prescribing provider as needed.  Progress: progressing   Anson [place], [facility]

## 2021-07-01 NOTE — Telephone Encounter (Signed)
It looks like his last apt was 07/2020 due back at 6 weeks?

## 2021-07-09 ENCOUNTER — Other Ambulatory Visit: Payer: Self-pay

## 2021-07-09 ENCOUNTER — Encounter: Payer: Self-pay | Admitting: Orthopedic Surgery

## 2021-07-09 ENCOUNTER — Ambulatory Visit (INDEPENDENT_AMBULATORY_CARE_PROVIDER_SITE_OTHER): Payer: BC Managed Care – PPO | Admitting: Orthopedic Surgery

## 2021-07-09 DIAGNOSIS — M25871 Other specified joint disorders, right ankle and foot: Secondary | ICD-10-CM | POA: Diagnosis not present

## 2021-07-09 MED ORDER — METHYLPREDNISOLONE ACETATE 40 MG/ML IJ SUSP
40.0000 mg | INTRAMUSCULAR | Status: AC | PRN
  Administered 2021-07-09: 40 mg via INTRA_ARTICULAR

## 2021-07-09 MED ORDER — LIDOCAINE HCL 1 % IJ SOLN
2.0000 mL | INTRAMUSCULAR | Status: AC | PRN
  Administered 2021-07-09: 2 mL
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
## 2021-07-10 ENCOUNTER — Ambulatory Visit (INDEPENDENT_AMBULATORY_CARE_PROVIDER_SITE_OTHER): Payer: BC Managed Care – PPO | Admitting: Mental Health

## 2021-07-10 DIAGNOSIS — F319 Bipolar disorder, unspecified: Secondary | ICD-10-CM

## 2021-07-10 DIAGNOSIS — M9905 Segmental and somatic dysfunction of pelvic region: Secondary | ICD-10-CM | POA: Diagnosis not present

## 2021-07-10 DIAGNOSIS — M9902 Segmental and somatic dysfunction of thoracic region: Secondary | ICD-10-CM | POA: Diagnosis not present

## 2021-07-10 DIAGNOSIS — M9903 Segmental and somatic dysfunction of lumbar region: Secondary | ICD-10-CM | POA: Diagnosis not present

## 2021-07-10 DIAGNOSIS — M9907 Segmental and somatic dysfunction of upper extremity: Secondary | ICD-10-CM | POA: Diagnosis not present

## 2021-07-10 NOTE — Progress Notes (Signed)
Psychotherapy Note  Name: Francisco Morgan Date:  07/10/21 MRN: [id number] DOB: Aug 11, 1975 PCP: [name], MD  Time spent: 54 minutes  Treatment: Individual therapy  Mental Status Exam: Appearance:    casual  Behavior:   appropriate  Motor:   WNL  Speech/Language:     Clear, coherent  Affect:   Full range  Mood:   euthymic  Thought process:   normal  Thought content:     WNL  Sensory/Perceptual disturbances:     WNL  Orientation:   x4  Attention:   Good  Concentration:   Good  Memory:   WNL  Fund of knowledge:    WNL  Insight:     good  Judgment:    good  Impulse Control:   good   Reported Symptoms:  Decreased sleep intermittently, some anxiety and irritability, ruminations  Risk Assessment: Danger to Self:  No Self-injurious Behavior: No Danger to Others: No Duty to Warn:no Physical Aggression / Violence:No  Access to Firearms a concern: No  Gang Involvement:No  Patient / guardian was educated about steps to take if suicide or homicide risk level increases between visits: yes While future psychiatric events cannot be accurately predicted, the patient does not currently require acute inpatient psychiatric care and does not currently meet [facility] involuntary commitment criteria.  Medications: Current Outpatient Medications  Medication Sig Dispense Refill   celecoxib (CELEBREX) 200 MG capsule TAKE (1) CAPSULE TWICE DAILY. Annual appt due in November 180 capsule 0   b complex vitamins tablet Take 1 tablet by mouth daily. 100 tablet 3   Cholecalciferol (VITAMIN D) 2000 units CAPS 1 daily 100 capsule 3   clonazePAM (KLONOPIN) 0.5 MG tablet TAKE ONE TABLET BY MOUTH THREE TIMES DAILY AS NEEDED FOR ANXIETY. 60 tablet 2   clotrimazole-betamethasone (LOTRISONE) cream Apply 1 application topically 3 (three) times daily as needed. 30 g 1   divalproex (DEPAKOTE ER) 500 MG 24 hr tablet Take 3 tablets (1,500 mg total) by mouth daily. 90 tablet 0   divalproex  (DEPAKOTE) 500 MG DR tablet TAKE 3 TABLETS AT BEDTIME. 270 tablet 1   esomeprazole (NEXIUM) 40 MG capsule Take 1 capsule (40 mg total) by mouth daily before breakfast. 90 capsule 4   HUMIRA PEN 40 MG/0.4ML PNKT Inject 40 mg into the muscle every 14 (fourteen) days.   1   lamoTRIgine (LAMICTAL) 200 MG tablet TAKE 1 TABLET ONCE DAILY. 90 tablet 1   methylphenidate (RITALIN) 10 MG tablet TAKE 1 TABLET 3 TIMES DAILY WITH MEALS. 270 tablet 0   METHYLPHENIDATE 36 MG PO CR tablet Take 1 tablet (36 mg total) by mouth daily. 90 tablet 0   No current facility-administered medications for this visit.    Allergies  Allergen Reactions   Nsaids Other (See Comments)    Constipation   Subjective:  Patient presents for session.  Patient shared recent progress, how he continues to have some relational strain at home with his mother.  He stated that she has many different expectations around the house based in her obsessiveness regarding the manner with which tasks around the household should be completed.  He continues to work on the [employer]; states that he can work for a few days at a time but then will need 2 to 3 days off.  Ways he attempts to cope with the stress of continuing to live at home with his parents was shared where he tends to stay in his room and avoid having  interactions with his mother when needed.  He plans to engage in a trip to a music festival in the coming weeks which is one of his main outlets for stress reduction.   Interventions: Motivational interviewing, supportive therapy, problem solving  Diagnoses:    ICD-10-CM   1. Bipolar affective disorder, rapid cycling ([place])  F31.9          Plan: Patient is to use CBT, mindfulness and coping skills to help manage decrease symptoms associated with their diagnosis.   Continue to take steps toward effective communication in his relationship with is parents to keep stress low.  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   Long-term goal:   Reduce overall level, frequency, and intensity of the feelings of depression, anxiety evidenced by  decreased irritability, negative self talk, and improved functioning up to 80% of the time as reported by patient for at least 3 consecutive months.    Short-term goal:  Verbally express understanding of the relationship between feelings of depression, anxiety and their impact on thinking patterns and behaviors. Verbalize an understanding of the role that distorted thinking plays in creating fears, excessive worry, and ruminations. 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 Patient to identify and utilize effective communication skills with others       Patient to report any concerns with his medications to his prescribing provider as needed.  Progress: progressing   Anson [place], [facility]

## 2021-07-15 ENCOUNTER — Other Ambulatory Visit: Payer: Self-pay | Admitting: Internal Medicine

## 2021-07-17 ENCOUNTER — Other Ambulatory Visit: Payer: Self-pay | Admitting: Psychiatry

## 2021-07-17 DIAGNOSIS — F902 Attention-deficit hyperactivity disorder, combined type: Secondary | ICD-10-CM

## 2021-07-17 DIAGNOSIS — M9905 Segmental and somatic dysfunction of pelvic region: Secondary | ICD-10-CM | POA: Diagnosis not present

## 2021-07-17 DIAGNOSIS — M9907 Segmental and somatic dysfunction of upper extremity: Secondary | ICD-10-CM | POA: Diagnosis not present

## 2021-07-17 DIAGNOSIS — M9903 Segmental and somatic dysfunction of lumbar region: Secondary | ICD-10-CM | POA: Diagnosis not present

## 2021-07-17 DIAGNOSIS — M9902 Segmental and somatic dysfunction of thoracic region: Secondary | ICD-10-CM | POA: Diagnosis not present

## 2021-07-17 NOTE — Telephone Encounter (Signed)
Filled both on 9/29 please review

## 2021-07-17 NOTE — Telephone Encounter (Signed)
Pt called and asked for refill of Methylphenidate to be sent in to [facility] for pick up on 12/26.  By looking at med list, does he have to wait until 12/27?  If so, pls let him know as he is planning on going out of town.  Next appt 1/11

## 2021-07-25 ENCOUNTER — Ambulatory Visit: Payer: BC Managed Care – PPO | Admitting: Mental Health

## 2021-08-01 ENCOUNTER — Ambulatory Visit: Payer: BC Managed Care – PPO | Admitting: Mental Health

## 2021-08-01 ENCOUNTER — Other Ambulatory Visit: Payer: Self-pay

## 2021-08-01 DIAGNOSIS — F319 Bipolar disorder, unspecified: Secondary | ICD-10-CM

## 2021-08-01 NOTE — Progress Notes (Signed)
Psychotherapy Note  Name: ABEM SHADDIX Date:  08/01/21 MRN: [id number] DOB: 1975/09/18 PCP: [name], MD  Time spent: 50 minutes  Treatment: Individual therapy  Mental Status Exam: Appearance:    casual  Behavior:   appropriate  Motor:   WNL  Speech/Language:     Clear, coherent  Affect:   Full range  Mood:   euthymic  Thought process:   normal  Thought content:     WNL  Sensory/Perceptual disturbances:     WNL  Orientation:   x4  Attention:   Good  Concentration:   Good  Memory:   WNL  Fund of knowledge:    WNL  Insight:     good  Judgment:    good  Impulse Control:   good   Reported Symptoms:  Decreased sleep intermittently, some anxiety and irritability, ruminations  Risk Assessment: Danger to Self:  No Self-injurious Behavior: No Danger to Others: No Duty to Warn:no Physical Aggression / Violence:No  Access to Firearms a concern: No  Gang Involvement:No  Patient / guardian was educated about steps to take if suicide or homicide risk level increases between visits: yes While future psychiatric events cannot be accurately predicted, the patient does not currently require acute inpatient psychiatric care and does not currently meet [facility] involuntary commitment criteria.  Medications: Current Outpatient Medications  Medication Sig Dispense Refill   b complex vitamins tablet Take 1 tablet by mouth daily. 100 tablet 3   celecoxib (CELEBREX) 200 MG capsule TAKE (1) CAPSULE TWICE DAILY. Overdue for annual appt mus t see MD for refills 60 capsule 0   Cholecalciferol (VITAMIN D) 2000 units CAPS 1 daily 100 capsule 3   clonazePAM (KLONOPIN) 0.5 MG tablet TAKE ONE TABLET BY MOUTH THREE TIMES DAILY AS NEEDED FOR ANXIETY. 60 tablet 2   clotrimazole-betamethasone (LOTRISONE) cream Apply 1 application topically 3 (three) times daily as needed. 30 g 1   divalproex (DEPAKOTE ER) 500 MG 24 hr tablet Take 3 tablets (1,500 mg total) by mouth daily. 90 tablet  0   divalproex (DEPAKOTE) 500 MG DR tablet TAKE 3 TABLETS AT BEDTIME. 270 tablet 1   esomeprazole (NEXIUM) 40 MG capsule Take 1 capsule (40 mg total) by mouth daily before breakfast. 90 capsule 4   HUMIRA PEN 40 MG/0.4ML PNKT Inject 40 mg into the muscle every 14 (fourteen) days.   1   lamoTRIgine (LAMICTAL) 200 MG tablet TAKE 1 TABLET ONCE DAILY. 90 tablet 1   methylphenidate (RITALIN) 10 MG tablet TAKE 1 TABLET 3 TIMES DAILY WITH MEALS. 270 tablet 0   METHYLPHENIDATE 36 MG PO CR tablet Take 1 tablet (36 mg total) by mouth daily. 90 tablet 0   No current facility-administered medications for this visit.    Allergies  Allergen Reactions   Nsaids Other (See Comments)    Constipation   Subjective:  Patient presents for session on time.  Patient shared events since last session, family interactions over the Christmas holiday.  He stated they had a pleasant holiday together, how he enjoyed the thoughtful gift that his parents gave him which was phones toward his going to another music concert, which is one of his primary outlets for enjoyment.  He stated that he recently had purchased more furniture for his room in the house, which he continues to live with his parents.  This he stated was sorely needed due to not having enough furniture to keep his items more organized and to live in the  space long-term.  He continues to work on their home remodeling project as well as some real estate business.  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  He appears to thrive and gain more satisfaction by utilizing his vast knowledge of construction and home remodeling.  Ways he has attempted to navigate the communication, specifically with his mother in an effort to avoid conflicts was shared.  Facilitated his identifying how he  wants to continue to handle these types of situations which in turn will allow for less stress.   Interventions: Motivational interviewing, supportive therapy, problem solving  Diagnoses:    ICD-10-CM   1. Bipolar affective disorder, rapid cycling ([place])  F31.9           Plan: Patient is to use CBT, mindfulness and coping skills to help manage decrease symptoms associated with their diagnosis.   Continue to take steps toward effective communication in his relationship with is parents to keep stress low.     Long-term goal:   Reduce overall level, frequency, and intensity of the feelings of depression, anxiety evidenced by  decreased irritability, negative self talk, and improved functioning up to 80% of the time as reported by patient for at least 3 consecutive months.    Short-term goal:  Verbally express understanding of the relationship between feelings of depression, anxiety and their impact on thinking patterns and behaviors. Verbalize an understanding of the role that distorted thinking plays in creating fears, excessive worry, and ruminations. 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 Patient to identify and utilize effective communication skills with others       Patient to report any concerns with his medications to his prescribing provider as needed.  Progress: progressing   Anson [place], [facility]

## 2021-08-07 ENCOUNTER — Other Ambulatory Visit: Payer: Self-pay

## 2021-08-07 ENCOUNTER — Encounter: Payer: Self-pay | Admitting: Psychiatry

## 2021-08-07 ENCOUNTER — Ambulatory Visit: Payer: BC Managed Care – PPO | Admitting: Psychiatry

## 2021-08-07 VITALS — BP 147/84 | HR 86

## 2021-08-07 DIAGNOSIS — F4310 Post-traumatic stress disorder, unspecified: Secondary | ICD-10-CM | POA: Diagnosis not present

## 2021-08-07 DIAGNOSIS — F902 Attention-deficit hyperactivity disorder, combined type: Secondary | ICD-10-CM

## 2021-08-07 DIAGNOSIS — F319 Bipolar disorder, unspecified: Secondary | ICD-10-CM | POA: Diagnosis not present
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
## 2021-08-15 ENCOUNTER — Other Ambulatory Visit: Payer: Self-pay

## 2021-08-15 ENCOUNTER — Ambulatory Visit: Payer: BC Managed Care – PPO | Admitting: Mental Health

## 2021-08-15 DIAGNOSIS — F319 Bipolar disorder, unspecified: Secondary | ICD-10-CM

## 2021-08-15 NOTE — Progress Notes (Signed)
Psychotherapy Note  Name: JEMUEL LAURSEN Date:  08/15/21 MRN: [id number] DOB: 04/20/1976 PCP: [name], MD  Time spent: 57 minutes  Treatment: Individual therapy  Mental Status Exam: Appearance:    casual  Behavior:   appropriate  Motor:   WNL  Speech/Language:     Clear, coherent  Affect:   Full range  Mood:   euthymic  Thought process:   normal  Thought content:     WNL  Sensory/Perceptual disturbances:     WNL  Orientation:   x4  Attention:   Good  Concentration:   Good  Memory:   WNL  Fund of knowledge:    WNL  Insight:     good  Judgment:    good  Impulse Control:   good   Reported Symptoms:  Decreased sleep intermittently, some anxiety and irritability, ruminations  Risk Assessment: Danger to Self:  No Self-injurious Behavior: No Danger to Others: No Duty to Warn:no Physical Aggression / Violence:No  Access to Firearms a concern: No  Gang Involvement:No  Patient / guardian was educated about steps to take if suicide or homicide risk level increases between visits: yes While future psychiatric events cannot be accurately predicted, the patient does not currently require acute inpatient psychiatric care and does not currently meet [facility] involuntary commitment criteria.  Medications: Current Outpatient Medications  Medication Sig Dispense Refill   b complex vitamins tablet Take 1 tablet by mouth daily. 100 tablet 3   celecoxib (CELEBREX) 200 MG capsule TAKE (1) CAPSULE TWICE DAILY. Overdue for annual appt mus t see MD for refills 60 capsule 0   Cholecalciferol (VITAMIN D) 2000 units CAPS 1 daily 100 capsule 3   clonazePAM (KLONOPIN) 0.5 MG tablet TAKE ONE TABLET BY MOUTH THREE TIMES DAILY AS NEEDED FOR ANXIETY. 60 tablet 2   clotrimazole-betamethasone (LOTRISONE) cream Apply 1 application topically 3 (three) times daily as needed. 30 g 1   divalproex (DEPAKOTE) 500 MG DR tablet TAKE 3 TABLETS AT BEDTIME. 270 tablet 1   esomeprazole  (NEXIUM) 40 MG capsule Take 1 capsule (40 mg total) by mouth daily before breakfast. 90 capsule 4   HUMIRA PEN 40 MG/0.4ML PNKT Inject 40 mg into the muscle every 14 (fourteen) days.   1   lamoTRIgine (LAMICTAL) 200 MG tablet TAKE 1 TABLET ONCE DAILY. 90 tablet 1   methylphenidate (RITALIN) 10 MG tablet TAKE 1 TABLET 3 TIMES DAILY WITH MEALS. 270 tablet 0   METHYLPHENIDATE 36 MG PO CR tablet Take 1 tablet (36 mg total) by mouth daily. 90 tablet 0   No current facility-administered medications for this visit.    Allergies  Allergen Reactions   Nsaids Other (See Comments)    Constipation   Subjective:  Patient presents for session on time.  Patient shared recent events in progress where he has been working on his parents renovation property.  He stated that he had been feeling some mild to moderate depression over the last 2 weeks however, a few days ago he started to feel more manic, stating he was awake for about 36 hours.  Today, patient presents as highly verbal, engaging and alert.  He shared how he tends to over engage at times for those on line in the comments section, debating different topics.  It appears this may occur more when he is feeling more energetic.  He stated that during these instances he can be more reactive, agitated.  He identified ways that he tries to be mindful  of his emotional state, how he wants to communicate his thoughts and his mood changes.  Interventions: Motivational interviewing, supportive therapy, problem solving  Diagnoses:  No diagnosis found.       Plan: Patient is to use CBT, mindfulness and coping skills to help manage decrease symptoms associated with their diagnosis.   Continue to take steps toward effective communication in his relationship with is parents to keep stress low.     Long-term goal:   Reduce overall level, frequency, and intensity of the feelings of depression, anxiety evidenced by  decreased irritability, negative self talk, and  improved functioning up to 80% of the time as reported by patient for at least 3 consecutive months.    Short-term goal:  Verbally express understanding of the relationship between feelings of depression, anxiety and their impact on thinking patterns and behaviors. Verbalize an understanding of the role that distorted thinking plays in creating fears, excessive worry, and ruminations. 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 Patient to identify and utilize effective communication skills with others       Patient to report any concerns with his medications to his prescribing provider as needed.  Progress: progressing   Anson [place], [facility]

## 2021-08-18 ENCOUNTER — Other Ambulatory Visit: Payer: Self-pay | Admitting: Internal Medicine

## 2021-08-22 ENCOUNTER — Telehealth: Payer: Self-pay | Admitting: Internal Medicine

## 2021-08-22 ENCOUNTER — Other Ambulatory Visit: Payer: Self-pay | Admitting: Internal Medicine

## 2021-08-22 MED ORDER — CELECOXIB 200 MG PO CAPS: 200.0000 mg | ORAL_CAPSULE | Freq: Every day | ORAL | 0 refills | Status: DC

## 2021-08-22 NOTE — Telephone Encounter (Signed)
1.Medication Requested: celecoxib (CELEBREX) 200 MG capsule 2. Pharmacy (Name, [facility]): [place], [place] Phone:  [phone]  Fax:  [phone]     3. On Med List: y  81. Last Visit with PCP:   5. Next visit date with PCP:  Pt is completely out of medication. Please send over script for short supply, until appt time next week.    Agent: Please be advised that RX refills may take up to 3 business days. We ask that you follow-up with your pharmacy.

## 2021-08-22 NOTE — Telephone Encounter (Signed)
Per office policy sent 30 day to local pharmacy until appt.../lmb  

## 2021-08-26 ENCOUNTER — Ambulatory Visit (INDEPENDENT_AMBULATORY_CARE_PROVIDER_SITE_OTHER): Payer: BC Managed Care – PPO | Admitting: Internal Medicine

## 2021-08-26 ENCOUNTER — Other Ambulatory Visit: Payer: Self-pay

## 2021-08-26 ENCOUNTER — Encounter: Payer: Self-pay | Admitting: Internal Medicine

## 2021-08-26 VITALS — BP 130/80 | HR 106 | Resp 18 | Ht 68.0 in | Wt 187.6 lb

## 2021-08-26 DIAGNOSIS — L4050 Arthropathic psoriasis, unspecified: Secondary | ICD-10-CM | POA: Diagnosis not present

## 2021-08-26 DIAGNOSIS — F909 Attention-deficit hyperactivity disorder, unspecified type: Secondary | ICD-10-CM | POA: Diagnosis not present

## 2021-08-26 DIAGNOSIS — M5440 Lumbago with sciatica, unspecified side: Secondary | ICD-10-CM | POA: Diagnosis not present

## 2021-08-26 DIAGNOSIS — F3177 Bipolar disorder, in partial remission, most recent episode mixed: Secondary | ICD-10-CM | POA: Diagnosis not present

## 2021-08-26 LAB — COMPREHENSIVE METABOLIC PANEL
ALT: 22 U/L (ref 0–53)
AST: 22 U/L (ref 0–37)
Albumin: 4.8 g/dL (ref 3.5–5.2)
Alkaline Phosphatase: 51 U/L (ref 39–117)
BUN: 15 mg/dL (ref 6–23)
CO2: 25 mEq/L (ref 19–32)
Calcium: 9.6 mg/dL (ref 8.4–10.5)
Chloride: 98 mEq/L (ref 96–112)
Creatinine, Ser: 0.92 mg/dL (ref 0.40–1.50)
GFR: 101.04 mL/min (ref >60.00)
Glucose, Bld: 83 mg/dL (ref 70–99)
Potassium: 4 mEq/L (ref 3.5–5.1)
Sodium: 138 mEq/L (ref 135–145)
Total Bilirubin: 1.9 mg/dL — ABNORMAL HIGH (ref 0.2–1.2)
Total Protein: 7.7 g/dL (ref 6.0–8.3)

## 2021-08-26 LAB — HEMOGLOBIN A1C: Hgb A1c MFr Bld: 5.2 % (ref 4.6–6.5)

## 2021-08-26 LAB — LIPID PANEL
Cholesterol: 205 mg/dL — ABNORMAL HIGH (ref 0–200)
HDL: 60.9 mg/dL (ref >39.00)
NonHDL: 144.55
Total CHOL/HDL Ratio: 3
Triglycerides: 257 mg/dL — ABNORMAL HIGH (ref 0.0–149.0)
VLDL: 51.4 mg/dL — ABNORMAL HIGH (ref 0.0–40.0)

## 2021-08-26 LAB — CBC
HCT: 45.8 % (ref 39.0–52.0)
Hemoglobin: 15.6 g/dL (ref 13.0–17.0)
MCHC: 34.1 g/dL (ref 30.0–36.0)
MCV: 93.8 fl (ref 78.0–100.0)
Platelets: 235 10*3/uL (ref 150.0–400.0)
RBC: 4.88 Mil/uL (ref 4.22–5.81)
RDW: 12.8 % (ref 11.5–15.5)
WBC: 7.4 10*3/uL (ref 4.0–10.5)

## 2021-08-26 LAB — LDL CHOLESTEROL, DIRECT: Direct LDL: 120 mg/dL

## 2021-08-26 MED ORDER — CELECOXIB 200 MG PO CAPS: 200.0000 mg | ORAL_CAPSULE | Freq: Two times a day (BID) | ORAL | 3 refills | Status: DC

## 2021-08-26 NOTE — Patient Instructions (Addendum)
We will refill the celebrex. We will check the labs today.

## 2021-08-26 NOTE — Progress Notes (Signed)
° °  Subjective:   Patient ID: Francisco Morgan, male    DOB: 19-Apr-1976, 46 y.o.   MRN: [id number]  HPI The patient is a 46 YO man coming in for follow up medical conditions. Needs refill celebrex.   Review of Systems  Constitutional: Negative.   HENT: Negative.    Eyes: Negative.   Respiratory:  Negative for cough, chest tightness and shortness of breath.   Cardiovascular:  Negative for chest pain, palpitations and leg swelling.  Gastrointestinal:  Negative for abdominal distention, abdominal pain, constipation, diarrhea, nausea and vomiting.  Musculoskeletal:  Positive for arthralgias and back pain.  Skin: Negative.   Neurological: Negative.   Psychiatric/Behavioral:  The patient is nervous/anxious.    Objective:  Physical Exam Constitutional:      Appearance: He is well-developed.  HENT:     Head: Normocephalic and atraumatic.  Cardiovascular:     Rate and Rhythm: Normal rate and regular rhythm.  Pulmonary:     Effort: Pulmonary effort is normal. No respiratory distress.     Breath sounds: Normal breath sounds. No wheezing or rales.  Abdominal:     General: Bowel sounds are normal. There is no distension.     Palpations: Abdomen is soft.     Tenderness: There is no abdominal tenderness. There is no rebound.  Musculoskeletal:     Cervical back: Normal range of motion.  Skin:    General: Skin is warm and dry.  Neurological:     Mental Status: He is alert and oriented to person, place, and time.     Coordination: Coordination normal.    Vitals:   08/26/21 1028  BP: 130/80  Pulse: (!) 106  Resp: 18  SpO2: 98%  Weight: 187 lb 9.6 oz (85.1 kg)  Height: 5\' 8"  (1.727 m)    This visit occurred during the SARS-CoV-2 public health emergency.  Safety protocols were in place, including screening questions prior to the visit, additional usage of staff PPE, and extensive cleaning of exam room while observing appropriate contact time as indicated for disinfecting solutions.    Assessment & Plan:  Visit time 20 minutes in face to face communication with patient and coordination of care, additional 10 minutes spent in record review, coordination or care, ordering tests, communicating/referring to other healthcare professionals, documenting in medical records all on the same day of the visit for total time 30 minutes spent on the visit.

## 2021-08-27 NOTE — Assessment & Plan Note (Signed)
Intermittent and uses celebrex for this pain. This is refilled until he can get back to see PCP.

## 2021-08-27 NOTE — Assessment & Plan Note (Signed)
Checking CMP and CBC as he is getting these every 6 months with rheumatology. He is taking humira and celebrex.

## 2021-08-27 NOTE — Assessment & Plan Note (Signed)
BP is acceptable but possibly borderline for ongoing stimulant treatment. Will need monitoring.

## 2021-08-27 NOTE — Assessment & Plan Note (Signed)
He has not had lipid panel or HgA1c in some time and this needs evaluation due to medications. Checking today.

## 2021-08-29 ENCOUNTER — Ambulatory Visit (INDEPENDENT_AMBULATORY_CARE_PROVIDER_SITE_OTHER): Payer: BC Managed Care – PPO | Admitting: Mental Health

## 2021-08-29 ENCOUNTER — Other Ambulatory Visit: Payer: Self-pay

## 2021-08-29 DIAGNOSIS — F319 Bipolar disorder, unspecified: Secondary | ICD-10-CM | POA: Diagnosis not present
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
## 2021-09-02 ENCOUNTER — Ambulatory Visit: Payer: BC Managed Care – PPO | Admitting: Internal Medicine

## 2021-09-06 ENCOUNTER — Other Ambulatory Visit: Payer: Self-pay

## 2021-09-06 ENCOUNTER — Encounter: Payer: Self-pay | Admitting: Internal Medicine

## 2021-09-06 ENCOUNTER — Ambulatory Visit (INDEPENDENT_AMBULATORY_CARE_PROVIDER_SITE_OTHER): Payer: BC Managed Care – PPO | Admitting: Internal Medicine

## 2021-09-06 VITALS — BP 128/84 | HR 92 | Temp 98.0°F | Ht 68.0 in | Wt 194.0 lb

## 2021-09-06 DIAGNOSIS — R7989 Other specified abnormal findings of blood chemistry: Secondary | ICD-10-CM

## 2021-09-06 DIAGNOSIS — K222 Esophageal obstruction: Secondary | ICD-10-CM

## 2021-09-06 DIAGNOSIS — E559 Vitamin D deficiency, unspecified: Secondary | ICD-10-CM

## 2021-09-06 DIAGNOSIS — F3177 Bipolar disorder, in partial remission, most recent episode mixed: Secondary | ICD-10-CM | POA: Diagnosis not present

## 2021-09-06 DIAGNOSIS — K219 Gastro-esophageal reflux disease without esophagitis: Secondary | ICD-10-CM | POA: Diagnosis not present

## 2021-09-06 DIAGNOSIS — L4050 Arthropathic psoriasis, unspecified: Secondary | ICD-10-CM | POA: Diagnosis not present

## 2021-09-06 DIAGNOSIS — R635 Abnormal weight gain: Secondary | ICD-10-CM

## 2021-09-06 LAB — TSH: TSH: 2.32 u[IU]/mL (ref 0.35–5.50)

## 2021-09-06 LAB — T4, FREE: Free T4: 0.83 ng/dL (ref 0.60–1.60)

## 2021-09-06 LAB — TESTOSTERONE: Testosterone: 190.13 ng/dL — ABNORMAL LOW (ref 300.00–890.00)

## 2021-09-06 NOTE — Assessment & Plan Note (Signed)
Dr Clovis Pu On Depakote, Lamictal

## 2021-09-06 NOTE — Assessment & Plan Note (Signed)
Check testosterone. 

## 2021-09-06 NOTE — Patient Instructions (Signed)
The Obesity Code book by Sharman Cheek   These suggestions will probably help you to improve your metabolism if you are not overweight and to lose weight if you are overweight: 1.  Reduce your consumption of sugars and starches.  Eliminate high fructose corn syrup from your diet.  Reduce your consumption of processed foods.  For desserts try to have seasonal fruits, berries, nuts, cheeses or dark chocolate with more than 70% cacao. 2.  Do not snack 3.  You do not have to eat breakfast.  If you choose to have breakfast - eat plain greek yogurt, eggs, oatmeal (without sugar) - use honey if you need to. 4.  Drink water, freshly brewed unsweetened tea (green, black or herbal) or coffee.  Do not drink sodas including diet sodas , juices, beverages sweetened with artificial sweeteners. 5.  Reduce your consumption of refined grains. 6.  Avoid protein drinks such as Optifast, Slim fast etc. Eat chicken, fish, meat, dairy and beans for your sources of protein. 7.  Natural unprocessed fats like cold pressed virgin olive oil, butter, coconut oil are good for you.  Eat avocados. 8.  Increase your consumption of fiber.  Fruits, berries, vegetables, whole grains, flaxseed, chia seeds, beans, popcorn, nuts, oatmeal are good sources of fiber 9.  Use vinegar in your diet, i.e. apple cider vinegar, red wine or balsamic vinegar 10.  You can try fasting.  For example you can skip breakfast and lunch every other day (24-hour fast) 11.  Stress reduction, good night sleep, relaxation, meditation, yoga and other physical activity is likely to help you to maintain low weight too. 12.  If you drink alcohol, limit your alcohol intake to no more than 2 drinks a day.

## 2021-09-06 NOTE — Assessment & Plan Note (Signed)
H/o strictures, long time GERD, last EGD in 2005 Will ref to see Dr Carlean Purl Cont on Nexium

## 2021-09-06 NOTE — Assessment & Plan Note (Signed)
Will check testosterone, FT4, TSH
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
## 2021-09-06 NOTE — Assessment & Plan Note (Signed)
On Vit D 

## 2021-09-06 NOTE — Assessment & Plan Note (Signed)
On Celebrex, Humira

## 2021-09-08 ENCOUNTER — Other Ambulatory Visit: Payer: Self-pay | Admitting: Internal Medicine

## 2021-09-08 DIAGNOSIS — R7989 Other specified abnormal findings of blood chemistry: Secondary | ICD-10-CM

## 2021-09-12 ENCOUNTER — Ambulatory Visit: Payer: BC Managed Care – PPO | Admitting: Mental Health

## 2021-09-12 ENCOUNTER — Other Ambulatory Visit: Payer: Self-pay

## 2021-09-12 DIAGNOSIS — F319 Bipolar disorder, unspecified: Secondary | ICD-10-CM
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
## 2021-09-18 ENCOUNTER — Encounter: Payer: Self-pay | Admitting: Internal Medicine

## 2021-09-23 ENCOUNTER — Other Ambulatory Visit: Payer: Self-pay | Admitting: Psychiatry

## 2021-09-23 DIAGNOSIS — F310 Bipolar disorder, current episode hypomanic: Secondary | ICD-10-CM

## 2021-09-26 ENCOUNTER — Other Ambulatory Visit: Payer: Self-pay

## 2021-09-26 ENCOUNTER — Ambulatory Visit: Payer: BC Managed Care – PPO | Admitting: Mental Health

## 2021-09-26 DIAGNOSIS — F319 Bipolar disorder, unspecified: Secondary | ICD-10-CM | POA: Diagnosis not present

## 2021-09-26 NOTE — Progress Notes (Signed)
Psychotherapy Note ? ?Name: Francisco Morgan ?Date:  09/26/21 ?MRN: [id number] ?DOB: [birth year] ?PCP: Plotnikov, Evie Lacks, MD ? ?Time spent: 56 minutes ? ?Treatment: Individual therapy ? ?Mental Status Exam: ?Appearance:    casual  ?Behavior:   appropriate  ?Motor:   WNL  ?Speech/Language:  ?   Clear, coherent  ?Affect:   Full range  ?Mood:   euthymic  ?Thought process:   normal  ?Thought content:     WNL  ?Sensory/Perceptual disturbances:     WNL  ?Orientation:   x4  ?Attention:   Good  ?Concentration:   Good  ?Memory:   WNL  ?Fund of knowledge:    WNL  ?Insight:     good  ?Judgment:    good  ?Impulse Control:   good  ? ?Reported Symptoms:  Decreased sleep intermittently, some anxiety and irritability, ruminations ? ?Risk Assessment: ?Danger to Self:  No ?Self-injurious Behavior: No ?Danger to Others: No ?Duty to Warn:no ?Physical Aggression / Violence:No  ?Access to Firearms a concern: No  ?Gang Involvement:No  ?Patient / guardian was educated about steps to take if suicide or homicide risk level increases between visits: yes ?While future psychiatric events cannot be accurately predicted, the patient does not currently require acute inpatient psychiatric care and does not currently meet [facility] involuntary commitment criteria. ? ?Medications: ?Current Outpatient Medications  ?Medication Sig Dispense Refill  ? b complex vitamins tablet Take 1 tablet by mouth daily. 100 tablet 3  ? celecoxib (CELEBREX) 200 MG capsule Take 1 capsule (200 mg total) by mouth 2 (two) times daily. Must keep schedule appt for future refills 60 capsule 3  ? Cholecalciferol (VITAMIN D) 2000 units CAPS 1 daily 100 capsule 3  ? clonazePAM (KLONOPIN) 0.5 MG tablet TAKE ONE TABLET BY MOUTH THREE TIMES DAILY AS NEEDED FOR ANXIETY. 60 tablet 2  ? clotrimazole-betamethasone (LOTRISONE) cream Apply 1 application topically 3 (three) times daily as needed. 30 g 1  ? divalproex (DEPAKOTE) 500 MG DR tablet TAKE 3 TABLETS AT BEDTIME. 270 tablet 1   ? esomeprazole (NEXIUM) 40 MG capsule Take 1 capsule (40 mg total) by mouth daily before breakfast. 90 capsule 4  ? HUMIRA PEN 40 MG/0.4ML PNKT Inject 40 mg into the muscle every 14 (fourteen) days.   1  ? lamoTRIgine (LAMICTAL) 200 MG tablet TAKE ONE TABLET BY MOUTH ONCE DAILY 90 tablet 0  ? methylphenidate (RITALIN) 10 MG tablet TAKE 1 TABLET 3 TIMES DAILY WITH MEALS. 270 tablet 0  ? METHYLPHENIDATE 36 MG PO CR tablet Take 1 tablet (36 mg total) by mouth daily. 90 tablet 0  ? ?No current facility-administered medications for this visit.  ? ? ?Allergies  ?Allergen Reactions  ? Nsaids Other (See Comments)  ?  Constipation  ? ?Subjective:  ?Patient presents for session on time.  Patient shared some of the ongoing stress related to the relationship with his parents, specifically with his mother.  Went on to share a recent communication that had.  He gets frustrated for what appears that she can be overly critical at times of his decisions, also his not feeling validated.  He states most of his discussions are typically with his father as they have a easier time communicating with 1 another.  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  He continues to try and did not use the child and mindful of his daily routine, getting adequate rest and avoiding  injury while working.  He has he attends all his doctor he attends all his doctor appointments, reports some concerns due to low 2 concerns due to low testosterone levels and concerns due to low testosterone levels which he plans to further discuss with discuss with his doctor due to recent results. ? ? ?Interventions: supportive therapy ? ?Diagnoses:  ?  ICD-10-CM   ?1. Bipolar affective disorder, rapid cycling ([place])  F31.9   ?  ? ? ? ? ? ? ? ? ? ?Plan: Patient is to use CBT, mindfulness and coping skills to help manage decrease symptoms associated with their diagnosis.   Continue to take  steps toward effective communication in his relationship with is parents to keep stress low.   ?  ?Long-term goal:   ?Reduce overall level, frequency, and intensity of the feelings of depression, anxiety evidenced by  decreased irritability, negative self talk, and improved functioning up to 80% of the time as reported by patient for at least 3 consecutive months.  ?  ?Short-term goal:  ?Verbally express understanding of the relationship between feelings of depression, anxiety and their impact on thinking patterns and behaviors. ?Verbalize an understanding of the role that distorted thinking plays in creating fears, excessive worry, and ruminations. 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 ?Patient to identify and utilize effective communication skills with others ?      Patient to report any concerns with his medications to his prescribing provider as needed. ? ?Progress: progressing ? ? ?Anson [place], [facility]  ?

## 2021-10-04 DIAGNOSIS — M9907 Segmental and somatic dysfunction of upper extremity: Secondary | ICD-10-CM | POA: Diagnosis not present

## 2021-10-04 DIAGNOSIS — M9902 Segmental and somatic dysfunction of thoracic region: Secondary | ICD-10-CM | POA: Diagnosis not present

## 2021-10-04 DIAGNOSIS — M9903 Segmental and somatic dysfunction of lumbar region: Secondary | ICD-10-CM | POA: Diagnosis not present

## 2021-10-04 DIAGNOSIS — M9905 Segmental and somatic dysfunction of pelvic region: Secondary | ICD-10-CM | POA: Diagnosis not present

## 2021-10-07 NOTE — Telephone Encounter (Signed)
error 

## 2021-10-10 ENCOUNTER — Other Ambulatory Visit: Payer: Self-pay

## 2021-10-10 ENCOUNTER — Ambulatory Visit: Payer: BC Managed Care – PPO | Admitting: Mental Health

## 2021-10-10 DIAGNOSIS — F319 Bipolar disorder, unspecified: Secondary | ICD-10-CM | POA: Diagnosis not present

## 2021-10-10 NOTE — Progress Notes (Signed)
Psychotherapy Note ? ?Name: Francisco Morgan ?Date:  10/09/21 ?MRN: [id number] ?DOB: 07-05-1976 ?PCP: Plotnikov, Evie Lacks, MD ? ?Time spent: 56 minutes ? ?Treatment: Individual therapy ? ?Mental Status Exam: Patient ?Appearance:    casual  ?Behavior:   appropriate  ?Motor:   WNL  ?Speech/Language:  ?   Clear, coherent  ?Affect:   Full range  ?Mood:   euthymic  ?Thought process:   normal  ?Thought content:     WNL  ?Sensory/Perceptual disturbances:     WNL  ?Orientation:   x4  ?Attention:   Good  ?Concentration:   Good  ?Memory:   WNL  ?Fund of knowledge:    WNL  ?Insight:     good  ?Judgment:    good  ?Impulse Control:   good  ? ?Reported Symptoms:  Decreased sleep intermittently, some anxiety and irritability, ruminations ? ?Risk Assessment: ?Danger to Self:  No ?Self-injurious Behavior: No ?Danger to Others: No ?Duty to Warn:no ?Physical Aggression / Violence:No  ?Access to Firearms a concern: No  ?Gang Involvement:No  ?Patient / guardian was educated about steps to take if suicide or homicide risk level increases between visits: yes ?While future psychiatric events cannot be accurately predicted, the patient does not currently require acute inpatient psychiatric care and does not currently meet [facility] involuntary commitment criteria. ? ?Medications: ?Current Outpatient Medications  ?Medication Sig Dispense Refill  ? b complex vitamins tablet Take 1 tablet by mouth daily. 100 tablet 3  ? celecoxib (CELEBREX) 200 MG capsule Take 1 capsule (200 mg total) by mouth 2 (two) times daily. Must keep schedule appt for future refills 60 capsule 3  ? Cholecalciferol (VITAMIN D) 2000 units CAPS 1 daily 100 capsule 3  ? clonazePAM (KLONOPIN) 0.5 MG tablet TAKE ONE TABLET BY MOUTH THREE TIMES DAILY AS NEEDED FOR ANXIETY. 60 tablet 2  ? clotrimazole-betamethasone (LOTRISONE) cream Apply 1 application topically 3 (three) times daily as needed. 30 g 1  ? divalproex (DEPAKOTE) 500 MG DR tablet TAKE 3 TABLETS AT BEDTIME. 270  tablet 1  ? esomeprazole (NEXIUM) 40 MG capsule Take 1 capsule (40 mg total) by mouth daily before breakfast. 90 capsule 4  ? HUMIRA PEN 40 MG/0.4ML PNKT Inject 40 mg into the muscle every 14 (fourteen) days.   1  ? lamoTRIgine (LAMICTAL) 200 MG tablet TAKE ONE TABLET BY MOUTH ONCE DAILY 90 tablet 0  ? methylphenidate (RITALIN) 10 MG tablet TAKE 1 TABLET 3 TIMES DAILY WITH MEALS. 270 tablet 0  ? METHYLPHENIDATE 36 MG PO CR tablet Take 1 tablet (36 mg total) by mouth daily. 90 tablet 0  ? ?No current facility-administered medications for this visit.  ? ? ?Allergies  ?Allergen Reactions  ? Nsaids Other (See Comments)  ?  Constipation  ? ?Subjective:  ?Patient presents for session on time.  Patient processed feelings of distress related to some medical issues, states that he has spoken with his doctor regarding low testosterone levels.  Expressed frustration due to not feeling like the issue is being addressed.  He identified the need to continue to try and gain clarity by communicating with his doctor again.  Continues to express some frustration related to decisions his parents make about making home repairs to their current residence, not being consulted, or having his opinions or advised validated at times by his mother primarily.  Ways he is trying to communicate with them while also trying to avoid having arguments is what he continues to try and balance.  He plans to follow through with trying to communicate with his doctor about questions identified today, that he feels have been unaddressed.  Ways to cope and care for himself were reviewed, getting adequate rest while also working to maintain a consistent daily schedule and making progress with home repairs to one of his parents properties.. ? ? ? ? ?Interventions: supportive therapy ? ?Diagnoses:  ?  ICD-10-CM   ?1. Bipolar affective disorder, rapid cycling ([place])  F31.9   ?  ? ? ? ? ? ? ? ? ? ? ?Plan: Patient is to use CBT, mindfulness and coping skills to  help manage decrease symptoms associated with their diagnosis.   Continue to take steps toward effective communication in his relationship with is parents to keep stress low.   ?  ?Long-term goal:   ?Reduce overall level, frequency, and intensity of the feelings of depression, anxiety evidenced by  decreased irritability, negative self talk, and improved functioning up to 80% of the time as reported by patient for at least 3 consecutive months.  ?  ?Short-term goal:  ?Verbally express understanding of the relationship between feelings of depression, anxiety and their impact on thinking patterns and behaviors. ?Verbalize an understanding of the role that distorted thinking plays in creating fears, excessive worry, and ruminations. 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 ?Patient to identify and utilize effective communication skills with others ?      Patient to report any concerns with his medications to his prescribing provider as needed. ? ?Progress: progressing ? ? ?Anson [place], [facility]  ?

## 2021-10-21 ENCOUNTER — Other Ambulatory Visit: Payer: Self-pay | Admitting: Psychiatry

## 2021-10-21 DIAGNOSIS — F902 Attention-deficit hyperactivity disorder, combined type: Secondary | ICD-10-CM

## 2021-10-22 ENCOUNTER — Other Ambulatory Visit: Payer: Self-pay | Admitting: Psychiatry

## 2021-10-22 ENCOUNTER — Telehealth: Payer: Self-pay | Admitting: Psychiatry

## 2021-10-22 DIAGNOSIS — F902 Attention-deficit hyperactivity disorder, combined type: Secondary | ICD-10-CM

## 2021-10-22 MED ORDER — METHYLPHENIDATE HCL 10 MG PO TABS: ORAL_TABLET | ORAL | 0 refills | Status: DC

## 2021-10-22 MED ORDER — METHYLPHENIDATE HCL ER (OSM) 36 MG PO TBCR: 36.0000 mg | EXTENDED_RELEASE_TABLET | Freq: Every day | ORAL | 0 refills | Status: DC

## 2021-10-22 NOTE — Telephone Encounter (Signed)
Pt called reporting [facility] is out of stock on Ritalin & Concerta  generic was sent yesterday. Requesting to send to [facility] asap. They have some in stock. ? ?

## 2021-10-22 NOTE — Telephone Encounter (Signed)
sent 

## 2021-10-24 ENCOUNTER — Ambulatory Visit: Payer: BC Managed Care – PPO | Admitting: Mental Health

## 2021-10-24 DIAGNOSIS — F319 Bipolar disorder, unspecified: Secondary | ICD-10-CM | POA: Diagnosis not present

## 2021-10-24 NOTE — Progress Notes (Signed)
Psychotherapy Note ? ?Name: Francisco Morgan ?Date:  10/24/21 ?MRN: [id number] ?DOB: 1975-10-12 ?PCP: Plotnikov, Evie Lacks, MD ? ?Time spent: 56 minutes ? ?Treatment: Individual therapy ? ?Mental Status Exam: Patient ?Appearance:    casual  ?Behavior:   appropriate  ?Motor:   WNL  ?Speech/Language:  ?   Clear, coherent  ?Affect:   Full range  ?Mood:   euthymic  ?Thought process:   normal  ?Thought content:     WNL  ?Sensory/Perceptual disturbances:     WNL  ?Orientation:   x4  ?Attention:   Good  ?Concentration:   Good  ?Memory:   WNL  ?Fund of knowledge:    WNL  ?Insight:     good  ?Judgment:    good  ?Impulse Control:   good  ? ?Reported Symptoms:  Decreased sleep intermittently, some anxiety and irritability, ruminations ? ?Risk Assessment: ?Danger to Self:  No ?Self-injurious Behavior: No ?Danger to Others: No ?Duty to Warn:no ?Physical Aggression / Violence:No  ?Access to Firearms a concern: No  ?Gang Involvement:No  ?Patient / guardian was educated about steps to take if suicide or homicide risk level increases between visits: yes ?While future psychiatric events cannot be accurately predicted, the patient does not currently require acute inpatient psychiatric care and does not currently meet [facility] involuntary commitment criteria. ? ?Medications: ?Current Outpatient Medications  ?Medication Sig Dispense Refill  ? b complex vitamins tablet Take 1 tablet by mouth daily. 100 tablet 3  ? celecoxib (CELEBREX) 200 MG capsule Take 1 capsule (200 mg total) by mouth 2 (two) times daily. Must keep schedule appt for future refills 60 capsule 3  ? Cholecalciferol (VITAMIN D) 2000 units CAPS 1 daily 100 capsule 3  ? clonazePAM (KLONOPIN) 0.5 MG tablet TAKE ONE TABLET BY MOUTH THREE TIMES DAILY AS NEEDED FOR ANXIETY. 60 tablet 2  ? clotrimazole-betamethasone (LOTRISONE) cream Apply 1 application topically 3 (three) times daily as needed. 30 g 1  ? divalproex (DEPAKOTE) 500 MG DR tablet TAKE 3 TABLETS AT BEDTIME. 270  tablet 1  ? esomeprazole (NEXIUM) 40 MG capsule Take 1 capsule (40 mg total) by mouth daily before breakfast. 90 capsule 4  ? HUMIRA PEN 40 MG/0.4ML PNKT Inject 40 mg into the muscle every 14 (fourteen) days.   1  ? lamoTRIgine (LAMICTAL) 200 MG tablet TAKE ONE TABLET BY MOUTH ONCE DAILY 90 tablet 0  ? methylphenidate (RITALIN) 10 MG tablet TAKE ONE TABLET 3d wm 270 tablet 0  ? methylphenidate 36 MG PO CR tablet Take 1 tablet (36 mg total) by mouth daily. 90 tablet 0  ? ?No current facility-administered medications for this visit.  ? ? ?Allergies  ?Allergen Reactions  ? Nsaids Other (See Comments)  ?  Constipation  ? ?Subjective:  ?Patient presents for session on time. Patient continues to identify stressors presently as having to deal with additional workers that come to his parent's home to do various projects, and how ultimately their work is substandard and some capacity.  Patient with his history and knowledge of construction skills provides him a cane I and observation in these areas.  He continues to live at his parent's home, needing more room and space for himself due to most of his items being unpacked and at times cluttering his bedroom space.  They went on to share stress related to finances as he realized recently that his accounts were lower than previously estimated.  This led to patient questioning some aspects of his life in  terms of achievements at this point, given his age and tended to compare himself to others while also acknowledging the challenges that his mental health has imparted on his life.  He continues to have a strained relationship with his sister, no communication over the past few months and his described vigilance of promising never to visit her at her home due to some hurtful comments she made to him in the past. ? ? ?Interventions: supportive therapy ? ?Diagnoses:  ?  ICD-10-CM   ?1. Bipolar affective disorder, rapid cycling ([place])  F31.9   ?  ? ? ? ? ? ? ? ? ? ? ? ?Plan: Patient  is to use CBT, mindfulness and coping skills to help manage decrease symptoms associated with their diagnosis.   Continue to take steps toward effective communication in his relationship with is parents to keep stress low.   ?  ?Long-term goal:   ?Reduce overall level, frequency, and intensity of the feelings of depression, anxiety evidenced by  decreased irritability, negative self talk, and improved functioning up to 80% of the time as reported by patient for at least 3 consecutive months.  ?  ?Short-term goal:  ?Verbally express understanding of the relationship between feelings of depression, anxiety and their impact on thinking patterns and behaviors. ?Verbalize an understanding of the role that distorted thinking plays in creating fears, excessive worry, and ruminations. 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 ?Patient to identify and utilize effective communication skills with others ?      Patient to report any concerns with his medications to his prescribing provider as needed. ? ?Progress: progressing ? ? ?Anson [place], [facility]  ?

## 2021-10-31 DIAGNOSIS — M9902 Segmental and somatic dysfunction of thoracic region: Secondary | ICD-10-CM | POA: Diagnosis not present

## 2021-10-31 DIAGNOSIS — M9907 Segmental and somatic dysfunction of upper extremity: Secondary | ICD-10-CM | POA: Diagnosis not present

## 2021-10-31 DIAGNOSIS — M9905 Segmental and somatic dysfunction of pelvic region: Secondary | ICD-10-CM | POA: Diagnosis not present

## 2021-10-31 DIAGNOSIS — M9903 Segmental and somatic dysfunction of lumbar region: Secondary | ICD-10-CM | POA: Diagnosis not present

## 2021-11-01 DIAGNOSIS — M9902 Segmental and somatic dysfunction of thoracic region: Secondary | ICD-10-CM | POA: Diagnosis not present

## 2021-11-01 DIAGNOSIS — M9903 Segmental and somatic dysfunction of lumbar region: Secondary | ICD-10-CM | POA: Diagnosis not present

## 2021-11-01 DIAGNOSIS — M9905 Segmental and somatic dysfunction of pelvic region: Secondary | ICD-10-CM | POA: Diagnosis not present

## 2021-11-01 DIAGNOSIS — M9907 Segmental and somatic dysfunction of upper extremity: Secondary | ICD-10-CM | POA: Diagnosis not present

## 2021-11-04 NOTE — Patient Instructions (Addendum)
? ? ? ? ?  You had two injection today for your pain.   ? ? ? ? ? ?Return if symptoms worsen or fail to improve. ? ?

## 2021-11-04 NOTE — Progress Notes (Signed)
? ? ?Subjective:  ? ? Patient ID: Francisco Morgan, male    DOB: Jul 26, 1976, 46 y.o.   MRN: [id number] ? ?This visit occurred during the SARS-CoV-2 public health emergency.  Safety protocols were in place, including screening questions prior to the visit, additional usage of staff PPE, and extensive cleaning of exam room while observing appropriate contact time as indicated for disinfecting solutions. ? ? ? ?HPI ?Francisco Morgan is here for  ?Chief Complaint  ?Patient presents with  ? Back Pain  ?  Back pain and right lower back buttocks  ? ? ?Ongoing right lower back, buttock pain - it is a defined line of pain in the buttock.  It started two weeks ago.  He has had this a couple of times in the past.  He Twis a lot with his work and he thinks probably that twisting that he does aggravated this.  Pain seems to start in the right lower back near the SI joint and goes toward the bottom of the buttocks.  No pain into the leg.  No numbness, tingling or leg weakness. ? ?He goes to [employer] and he does a lot of activator work, stretching, turning, pressure - not as much cracking.  He takes celebrex.  He stretches regularly. ? ?His symptoms have not improved much and he wanted to get something that would help improve.  Injections in the past have helped. ? ? ?Medications and allergies reviewed with patient and updated if appropriate. ? ?Current Outpatient Medications on File Prior to Visit  ?Medication Sig Dispense Refill  ? b complex vitamins tablet Take 1 tablet by mouth daily. 100 tablet 3  ? celecoxib (CELEBREX) 200 MG capsule Take 1 capsule (200 mg total) by mouth 2 (two) times daily. Must keep schedule appt for future refills 60 capsule 3  ? Cholecalciferol (VITAMIN D) 2000 units CAPS 1 daily 100 capsule 3  ? clonazePAM (KLONOPIN) 0.5 MG tablet TAKE ONE TABLET BY MOUTH THREE TIMES DAILY AS NEEDED FOR ANXIETY. 60 tablet 2  ? clotrimazole-betamethasone (LOTRISONE) cream Apply 1 application topically 3 (three) times  daily as needed. 30 g 1  ? divalproex (DEPAKOTE) 500 MG DR tablet TAKE 3 TABLETS AT BEDTIME. 270 tablet 1  ? esomeprazole (NEXIUM) 40 MG capsule Take 1 capsule (40 mg total) by mouth daily before breakfast. 90 capsule 4  ? HUMIRA PEN 40 MG/0.4ML PNKT Inject 40 mg into the muscle every 14 (fourteen) days.   1  ? lamoTRIgine (LAMICTAL) 200 MG tablet TAKE ONE TABLET BY MOUTH ONCE DAILY 90 tablet 0  ? methylphenidate (RITALIN) 10 MG tablet TAKE ONE TABLET 3d wm 270 tablet 0  ? methylphenidate 36 MG PO CR tablet Take 1 tablet (36 mg total) by mouth daily. 90 tablet 0  ? ?No current facility-administered medications on file prior to visit.  ? ? ?Review of Systems ? ?   ?Objective:  ? ?Vitals:  ? 11/05/21 1049  ?BP: 134/82  ?Pulse: 77  ?Temp: 98.1 ?F (36.7 ?C)  ?SpO2: 97%  ? ?BP Readings from Last 3 Encounters:  ?11/05/21 134/82  ?09/06/21 128/84  ?08/26/21 130/80  ? ?Wt Readings from Last 3 Encounters:  ?11/05/21 189 lb (85.7 kg)  ?09/06/21 194 lb (88 kg)  ?08/26/21 187 lb 9.6 oz (85.1 kg)  ? ?Body mass index is 28.74 kg/m?. ? ?  ?Physical Exam ?Constitutional:   ?   Appearance: Normal appearance.  ?HENT:  ?   Head: Normocephalic and atraumatic.  ?Musculoskeletal:     ?  General: Tenderness (Mild tenderness right SI joint.  No tenderness lumbar spine or left lower back) present.  ?   Left lower leg: No edema.  ?Skin: ?   General: Skin is warm and dry.  ?Neurological:  ?   Mental Status: He is alert.  ?   Sensory: No sensory deficit.  ?   Motor: No weakness.  ?   Gait: Gait normal.  ? ?   ? ? ? ? ? ?Assessment & Plan:  ? ? ?Acute right lower back pain: ?Started several weeks ago-not improving ?He has had this in the past ?Pain starts at right SI joint and radiates to lower portion of buttock region, no leg pain, numbness/tingling or weakness ?Continue Celebrex 200 mg twice daily ?Continue regular stretching ?Continue to try to revise activities ?Will give Depo-Medrol 40 mg IM x1, Toradol 30 mg IM x1 today, which has  helped improve the pain in the past ? ? ? ? ?

## 2021-11-05 ENCOUNTER — Ambulatory Visit (INDEPENDENT_AMBULATORY_CARE_PROVIDER_SITE_OTHER): Payer: BC Managed Care – PPO | Admitting: Internal Medicine

## 2021-11-05 ENCOUNTER — Encounter: Payer: Self-pay | Admitting: Internal Medicine

## 2021-11-05 VITALS — BP 134/82 | HR 77 | Temp 98.1°F | Ht 68.0 in | Wt 189.0 lb

## 2021-11-05 DIAGNOSIS — M545 Low back pain, unspecified: Secondary | ICD-10-CM

## 2021-11-05 MED ORDER — METHYLPREDNISOLONE ACETATE 40 MG/ML IJ SUSP
40.0000 mg | Freq: Once | INTRAMUSCULAR | Status: AC
  Administered 2021-11-05: 40 mg via INTRAMUSCULAR

## 2021-11-05 MED ORDER — KETOROLAC TROMETHAMINE 30 MG/ML IJ SOLN
30.0000 mg | Freq: Once | INTRAMUSCULAR | Status: AC
  Administered 2021-11-05: 30 mg via INTRAMUSCULAR

## 2021-11-06 ENCOUNTER — Encounter: Payer: Self-pay | Admitting: Psychiatry

## 2021-11-06 ENCOUNTER — Ambulatory Visit: Payer: BC Managed Care – PPO | Admitting: Psychiatry

## 2021-11-06 DIAGNOSIS — F4310 Post-traumatic stress disorder, unspecified: Secondary | ICD-10-CM | POA: Diagnosis not present

## 2021-11-06 DIAGNOSIS — F310 Bipolar disorder, current episode hypomanic: Secondary | ICD-10-CM

## 2021-11-06 DIAGNOSIS — F902 Attention-deficit hyperactivity disorder, combined type: Secondary | ICD-10-CM

## 2021-11-06 DIAGNOSIS — F319 Bipolar disorder, unspecified: Secondary | ICD-10-CM | POA: Diagnosis not present

## 2021-11-06 MED ORDER — METHYLPHENIDATE HCL ER (OSM) 36 MG PO TBCR: 36.0000 mg | EXTENDED_RELEASE_TABLET | Freq: Every day | ORAL | 0 refills | Status: DC

## 2021-11-06 MED ORDER — LAMOTRIGINE 200 MG PO TABS: 200.0000 mg | ORAL_TABLET | Freq: Every day | ORAL | 0 refills | Status: DC

## 2021-11-06 MED ORDER — METHYLPHENIDATE HCL 10 MG PO TABS: ORAL_TABLET | ORAL | 0 refills | Status: DC

## 2021-11-06 MED ORDER — DIVALPROEX SODIUM 500 MG PO DR TAB: 2000.0000 mg | DELAYED_RELEASE_TABLET | Freq: Every day | ORAL | 1 refills | Status: DC

## 2021-11-06 NOTE — Progress Notes (Signed)
Francisco Morgan ?[id number] ?1975-11-18 ?46 y.o. ? ?Subjective:  ? ?Patient ID:  Francisco Morgan is a 46 y.o. (DOB 1975/12/05) male. ? ?Chief Complaint:  ?Chief Complaint  ?Patient presents with  ? Follow-up  ? ADD  ? ? ?HPI  ?Francisco Morgan presents to the office today for follow-up of Mood and anxiety.   ? ?when seen September 15, 2018.  The assessment was continued hypomania.  Further increases in Depakote were encouraged but no med changes were accomplished. ? ?At visit May 2020.  Assessment remained the same and he agreed to increase the Depakote slightly from 1000 to 1250 mg daily.  Recognizes he's less agitated on VPA. He doesn't like higher dosages of Depakote 1500 bc felt too flat and slowed. ? ?visit May 19, 2019.  No meds were changed because he refused any additional medication for hypomania.  Continued on Depakote 1250, lamotrigine 299 mg daily, Concerta 36 daily and Ritalin 10 TID. ? ?seen December 2020.  No meds were changed and were continued as above.  He still did not get the Depakote level. ? ?Got vaccine. ? ?As of 02/14/20 appt the following is noted: ?Feels meds including VPA has cognitive SE.   ?Has been doing better with consistency at 1500 mg daily Depakote.   Marland Kitchen   ?Recognizes chronic high risk behaviors. ?Chronic issues with parents especially around the house on which he's working.  But he's trying to keep them together. ?He increased as recommended. Afraid of being slowed down physically but handled it OK.  Under a lot of pressure to get both professional lives at full tilt.  A little positive mood stabilization with the increase, felt less chaotic.  Also overall less angry less easily.  Less likely to throw things in anger on Depakote..  ? ?Wonders about alternative mood stabilizer. ?Plan: cont meds and check level ? ?03/29/20 appt with the following noted: ?Really well.  Likes the Abilify with marked improvement in cognition.  Fog lifted.  Quicker with word finding.  Pleased to be off  Depakote. ?F says he he's noted somnolence, increased weight and concerns about metabolics. ?Gained 7-8 # without change in diet.  Also some weight gain before with more Depakote. ?Staph infection in knee since here.   ?Can sleep 12 hours but doesn't every day.  Wakes sharp and alert but after 6-7 hours then seems to crash cognitively with desire to lay down. ?Plan: Weaned off Depakote and switched to Abilify with good response cognitively.  ?Abilify 15 is better with cognition.  BUT SE. ?First switch Vraylar 3 mg daily DT drowsiness and weight gain. ? ?05/30/20 appt with the following noted: ?Vraylar worked out better.  It's a little more speedy with some fidgetiness.  Added caffeine in the morning.  Better energy without Abilify.  Don't have my favorite motivator is guilt and panic; ie less depressed and less anxious.  Motivation is lacking a little for difficult things.   ?World is getting very grim.  Gave up on Facebook a long time ago.  Finds Instagram more helpful.   ?Chronic stress with family ongoing.  Mother is very emotive. ?Off and on social anxiety has been crippling at times but fights it now.  Better in the last few months. ?Takes Concerta and Ritalin in the morning.  No caffeine. ?Takes clonazepam in the am on occassion.  Doesn't like the generic bc it's a little "boozy".  But needs it bc anxiety leads to agitation.   ?Less intense emotional  and less driven. ?Overall an improvement. ?Feels sleep is less restful but will likely adapt.  Some chronic sleep issues with circadian rhythm issues.  Would like to find some more fire in the belly. ?Pt reports that mood is intense.  Occ irritable.  and describes anxiety as better. Anxiety symptoms include: Excessive Worry,. Pt reports no sleep issues, wears himself out. Hard to make himself stop his day.  Still erratic pattern of sleep based on his perception of work demands.   Pt reports that appetite is good. Pt reports that energy is good and good.  Concentration is difficulty with focus and attention. Suicidal thoughts:  denied by patient.  ? ?08/09/2020 appointment with the following noted: ?No Covid. ?Good.  Thinking about Vraylar.  Gained to 200#. Never this heavy.  Gained more since here.  November 198#.  PCP Francisco Morgan.  Has changed diet and still not losing weight. ?Did'nt function well on Vraylar.  ?Historically using stimulants helped focus and excessive reactivity.  Without stimulants has manic useless energy, loses temper and less productive.  Stimulants help but less effective than it was.  More overwhelmed.  Littlest thing is hard. ?Plan: Stop Vraylar ?Start Equetro 100 mg capsules 2 at night for 3 nights, then 3 at night for 3 nights, then 4 at night for 3 nights, then 6 at night for 3 nights, then 1 in Am and 6 at night for 3 nights, then 2 in am and 6 at night for 3 nights, then 300 mg in AM and 600 mg at night. ? ?10/26/2020 appt noted: ?Francisco Morgan was too expensive and he wanted to switch back to Depakote.  Attempted to get Carbatrol for him as an alternative. ?Did take CBZ XR Didn't feel normal.  ?Back on Depakote ER 1500 mg HS   Needs to set an alarm which didn't need to on PDA.  ?Satisfied with Depakote for now. ?Disc work stressors. ?Patient reports stable mood and denies depressed or irritable moods.  Patient denies any recent difficulty with anxiety.  Patient denies difficulty with sleep initiation or maintenance. Denies appetite disturbance.  Patient reports that energy and motivation have been good.  Patient denies any difficulty with concentration.  Patient denies any suicidal ideation. ? ?08/07/2021 appt noted: ?Still on Depakote lamotrigine and methylphenidate. ?Some manic sx with some benefit with meds.  Recognizes his behavior is a little off but rather have less than more. ?Don't like Depakote as noted before and would rather take less than more. ?Still working on house for himself and parents.  Working without other people around.   ?Recognizes hyperverbal manic.   ?Still in therapy every other week.   ? 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   ?Parents make a substantial amount of money.  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  Does not want to work in this area.  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 ?Some chronic pain issues. ?Recognizes needs to find the time to get his home space cleaner and better organziezed.    Chronic ADD.   ?Doesn't feel great about anything in life right now.  Not confident enough to support himself alone. ?Couple of times of losing temper over neighbor's dog. ? ?Sister history psychosis in college. ? ?Psych med history:  Lexapro history SSRI withdrawal,  ?Latuda SE akathisia, seroquel SE, Risperdal ?Hx movement disorder sx on Risperdal,  ?lithium SE, lamotrigine. ?Depakote 1500.  ?Abilify 15 weight gain, Vraylar hyper, nervous (both  made him too nonchalant) ?Equetro Not adequate trial ?Weaned off Depakote and switched to Abilify with good response cognitively but SE weight  ?Concerta, ritalin  ?Clonazepam   ?No history of CBZ. ? ?History of Dr. Layla Barter ? ?Review of Systems:  ?Review of Systems  ?Respiratory:  Negative for shortness of breath.   ?Cardiovascular:  Negative for chest pain.  ?Gastrointestinal:  Negative for abdominal pain.  ?Musculoskeletal:  Positive for arthralgias, back pain and joint swelling.  ?Neurological:  Negative for dizziness, tremors, weakness and headaches.  ?Psychiatric/Behavioral:  Negative for agitation, behavioral problems, confusion, decreased concentration, dysphoric mood, hallucinations, self-injury, sleep disturbance and suicidal ideas. The patient is hyperactive. The patient is not nervous/anxious.  Humera helped. ? ?Medications: I have reviewed the patient's current medications. ? ?Current Outpatient Medications  ?Medication Sig Dispense Refill  ? b complex vitamins tablet Take 1 tablet by mouth  daily. 100 tablet 3  ? celecoxib (CELEBREX) 200 MG capsule Take 1 capsule (200 mg total) by mouth 2 (two) times daily. Must keep schedule appt for future refills 60 capsule 3  ? Cholecalciferol (VITAMIN D) 2000 units C

## 2021-11-07 ENCOUNTER — Ambulatory Visit: Payer: BC Managed Care – PPO | Admitting: Mental Health

## 2021-11-07 DIAGNOSIS — F319 Bipolar disorder, unspecified: Secondary | ICD-10-CM

## 2021-11-07 NOTE — Progress Notes (Signed)
Psychotherapy Note ? ?Name: Francisco Morgan ?Date:  11/07/21 ?MRN: [id number] ?DOB: 31-Oct-1975 ?PCP: Plotnikov, Evie Lacks, MD ? ?Time spent: 56 minutes ? ?Treatment: Individual therapy ? ?Mental Status Exam: Patient ?Appearance:    casual  ?Behavior:   appropriate  ?Motor:   WNL  ?Speech/Language:  ?   Clear, coherent  ?Affect:   Full range  ?Mood:   euthymic  ?Thought process:   normal  ?Thought content:     WNL  ?Sensory/Perceptual disturbances:     WNL  ?Orientation:   x4  ?Attention:   Good  ?Concentration:   Good  ?Memory:   WNL  ?Fund of knowledge:    WNL  ?Insight:     good  ?Judgment:    good  ?Impulse Control:   good  ? ?Reported Symptoms:  Decreased sleep intermittently, some anxiety and irritability, ruminations ? ?Risk Assessment: ?Danger to Self:  No ?Self-injurious Behavior: No ?Danger to Others: No ?Duty to Warn:no ?Physical Aggression / Violence:No  ?Access to Firearms a concern: No  ?Gang Involvement:No  ?Patient / guardian was educated about steps to take if suicide or homicide risk level increases between visits: yes ?While future psychiatric events cannot be accurately predicted, the patient does not currently require acute inpatient psychiatric care and does not currently meet [facility] involuntary commitment criteria. ? ?Medications: ?Current Outpatient Medications  ?Medication Sig Dispense Refill  ? b complex vitamins tablet Take 1 tablet by mouth daily. 100 tablet 3  ? celecoxib (CELEBREX) 200 MG capsule Take 1 capsule (200 mg total) by mouth 2 (two) times daily. Must keep schedule appt for future refills 60 capsule 3  ? Cholecalciferol (VITAMIN D) 2000 units CAPS 1 daily 100 capsule 3  ? clonazePAM (KLONOPIN) 0.5 MG tablet TAKE ONE TABLET BY MOUTH THREE TIMES DAILY AS NEEDED FOR ANXIETY. 60 tablet 2  ? clotrimazole-betamethasone (LOTRISONE) cream Apply 1 application topically 3 (three) times daily as needed. 30 g 1  ? divalproex (DEPAKOTE) 500 MG DR tablet Take 4 tablets (2,000 mg total)  by mouth at bedtime. 360 tablet 1  ? esomeprazole (NEXIUM) 40 MG capsule Take 1 capsule (40 mg total) by mouth daily before breakfast. 90 capsule 4  ? HUMIRA PEN 40 MG/0.4ML PNKT Inject 40 mg into the muscle every 14 (fourteen) days.   1  ? lamoTRIgine (LAMICTAL) 200 MG tablet Take 1 tablet (200 mg total) by mouth daily. 90 tablet 0  ? methylphenidate (RITALIN) 10 MG tablet TAKE ONE TABLET 3d wm 270 tablet 0  ? methylphenidate 36 MG PO CR tablet Take 1 tablet (36 mg total) by mouth daily. 90 tablet 0  ? ?No current facility-administered medications for this visit.  ? ? ?Allergies  ?Allergen Reactions  ? Nsaids Other (See Comments)  ?  Constipation  ? ?Subjective:  ?Patient presents for session on time. Patient share race an event in progress. Continues to have some concerns with testosterone levels and plans to follow up with his family doctor for further discussion. Share some recent her actions with his mother, his continuing to work on one of their home renovation projects. Share some feelings related to his ongoing financial stress. 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 Assisted him and working to reframe some  cognitions about his current state, exploring potential options locally as well if he did relocate. ? ? ? ?Interventions: supportive therapy ? ?Diagnoses:  ?  ICD-10-CM   ?1. Bipolar affective disorder, rapid cycling ([place])  F31.9   ?  ? ? ? ? ? ?Plan: Patient is to use CBT, mindfulness and coping skills to help manage decrease symptoms associated with their diagnosis.   Continue to take steps toward effective communication in his relationship with is parents to keep stress low.   ?  ?Long-term goal:   ?Reduce overall level, frequency, and intensity of the feelings  of depression, anxiety evidenced by  decreased irritability, negative self talk, and improved functioning up to 80% of the time as reported by patient for at least 3 consecutive months.  ?  ?Short-term goal:  ?Verbally express understanding of the relationship between feelings of depression, anxiety and their impact on thinking patterns and behaviors. ?Verbalize an understanding of the role that distorted thinking plays in creating fears, excessive worry, and ruminations. 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 ?Patient to identify and utilize effective communication skills with others ?      Patient to report any concerns with his medications to his prescribing provider as needed. ? ?Progress: progressing ? ? ?[place], [facility]  ?

## 2021-11-08 DIAGNOSIS — M9902 Segmental and somatic dysfunction of thoracic region: Secondary | ICD-10-CM | POA: Diagnosis not present

## 2021-11-08 DIAGNOSIS — M9905 Segmental and somatic dysfunction of pelvic region: Secondary | ICD-10-CM | POA: Diagnosis not present

## 2021-11-08 DIAGNOSIS — M9903 Segmental and somatic dysfunction of lumbar region: Secondary | ICD-10-CM | POA: Diagnosis not present

## 2021-11-08 DIAGNOSIS — M9907 Segmental and somatic dysfunction of upper extremity: Secondary | ICD-10-CM | POA: Diagnosis not present

## 2021-11-21 ENCOUNTER — Encounter: Payer: Self-pay | Admitting: Internal Medicine

## 2021-11-21 ENCOUNTER — Ambulatory Visit (INDEPENDENT_AMBULATORY_CARE_PROVIDER_SITE_OTHER): Payer: BC Managed Care – PPO | Admitting: Internal Medicine

## 2021-11-21 ENCOUNTER — Ambulatory Visit (INDEPENDENT_AMBULATORY_CARE_PROVIDER_SITE_OTHER): Payer: BC Managed Care – PPO | Admitting: Mental Health

## 2021-11-21 DIAGNOSIS — M5440 Lumbago with sciatica, unspecified side: Secondary | ICD-10-CM | POA: Diagnosis not present

## 2021-11-21 DIAGNOSIS — F3177 Bipolar disorder, in partial remission, most recent episode mixed: Secondary | ICD-10-CM

## 2021-11-21 DIAGNOSIS — R7989 Other specified abnormal findings of blood chemistry: Secondary | ICD-10-CM | POA: Diagnosis not present

## 2021-11-21 DIAGNOSIS — F319 Bipolar disorder, unspecified: Secondary | ICD-10-CM | POA: Diagnosis not present

## 2021-11-21 DIAGNOSIS — L4050 Arthropathic psoriasis, unspecified: Secondary | ICD-10-CM | POA: Diagnosis not present

## 2021-11-21 LAB — LUTEINIZING HORMONE: LH: 2.27 m[IU]/mL (ref 1.50–9.30)

## 2021-11-21 LAB — FOLLICLE STIMULATING HORMONE: FSH: 4.5 m[IU]/mL (ref 1.4–18.1)

## 2021-11-21 NOTE — Assessment & Plan Note (Signed)
on Humira ?
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
## 2021-11-21 NOTE — Progress Notes (Signed)
Psychotherapy Note ? ?Name: Francisco Morgan ?Date:  11/21/21 ?MRN: [id number] ?DOB: 04-06-1976 ?PCP: Plotnikov, Evie Lacks, MD ? ?Time spent: 54 minutes ? ?Treatment: Individual therapy ? ?Mental Status Exam: Patient ?Appearance:    casual  ?Behavior:   appropriate  ?Motor:   WNL  ?Speech/Language:  ?   Clear, coherent  ?Affect:   Full range  ?Mood:   euthymic  ?Thought process:   normal  ?Thought content:     WNL  ?Sensory/Perceptual disturbances:     WNL  ?Orientation:   x4  ?Attention:   Good  ?Concentration:   Good  ?Memory:   WNL  ?Fund of knowledge:    WNL  ?Insight:     good  ?Judgment:    good  ?Impulse Control:   good  ? ?Reported Symptoms:  Decreased sleep intermittently, some anxiety and irritability, ruminations ? ?Risk Assessment: ?Danger to Self:  No ?Self-injurious Behavior: No ?Danger to Others: No ?Duty to Warn:no ?Physical Aggression / Violence:No  ?Access to Firearms a concern: No  ?Gang Involvement:No  ?Patient / guardian was educated about steps to take if suicide or homicide risk level increases between visits: yes ?While future psychiatric events cannot be accurately predicted, the patient does not currently require acute inpatient psychiatric care and does not currently meet [facility] involuntary commitment criteria. ? ?Medications: ?Current Outpatient Medications  ?Medication Sig Dispense Refill  ? b complex vitamins tablet Take 1 tablet by mouth daily. 100 tablet 3  ? celecoxib (CELEBREX) 200 MG capsule Take 1 capsule (200 mg total) by mouth 2 (two) times daily. Must keep schedule appt for future refills 60 capsule 3  ? Cholecalciferol (VITAMIN D) 2000 units CAPS 1 daily 100 capsule 3  ? clonazePAM (KLONOPIN) 0.5 MG tablet TAKE ONE TABLET BY MOUTH THREE TIMES DAILY AS NEEDED FOR ANXIETY. 60 tablet 2  ? clotrimazole-betamethasone (LOTRISONE) cream Apply 1 application topically 3 (three) times daily as needed. 30 g 1  ? divalproex (DEPAKOTE) 500 MG DR tablet Take 4 tablets (2,000 mg total)  by mouth at bedtime. 360 tablet 1  ? esomeprazole (NEXIUM) 40 MG capsule Take 1 capsule (40 mg total) by mouth daily before breakfast. 90 capsule 4  ? HUMIRA PEN 40 MG/0.4ML PNKT Inject 40 mg into the muscle every 14 (fourteen) days.   1  ? lamoTRIgine (LAMICTAL) 200 MG tablet Take 1 tablet (200 mg total) by mouth daily. 90 tablet 0  ? methylphenidate (RITALIN) 10 MG tablet TAKE ONE TABLET 3d wm 270 tablet 0  ? methylphenidate 36 MG PO CR tablet Take 1 tablet (36 mg total) by mouth daily. 90 tablet 0  ? ?No current facility-administered medications for this visit.  ? ? ?Allergies  ?Allergen Reactions  ? Nsaids Other (See Comments)  ?  Constipation  ? ?Subjective:  ?Patient presents for session on time.  He shared recent events in progress, how he continues to work on his parents remodeling project as well as other projects in her current home.  He stated that he worked on one of the bathrooms while getting his father's input on Research scientist (physical sciences).  He shared how his mother got upset and was critical of his decision making.  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  He stated that this does not occur with his father, and how his father has noticed  it as well. ?Presents is more optimistic this session.  Less focused on negative life outcomes financial stress or just approval of living in this area.  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  Staying busy and content with current projects regarding the construction.  Reviewed ways to cope and care for himself, getting enough rest, continuing to be mindful of how he engages with his mother when upset. ? ? ? ? ?Interventions: supportive therapy ? ?Diagnoses:  ?  ICD-10-CM   ?1. Bipolar affective disorder, rapid cycling ([place])  F31.9   ?  ? ? ? ? ? ? ?Plan: Patient is to use CBT, mindfulness and  coping skills to help manage decrease symptoms associated with their diagnosis.   Continue to take steps toward effective communication in his relationship with is parents to keep stress low.   ?  ?Long-term goal:   ?Reduce overall level, frequency, and intensity of the feelings of depression, anxiety evidenced by  decreased irritability, negative self talk, and improved functioning up to 80% of the time as reported by patient for at least 3 consecutive months.  ?  ?Short-term goal:  ?Verbally express understanding of the relationship between feelings of depression, anxiety and their impact on thinking patterns and behaviors. ?Verbalize an understanding of the role that distorted thinking plays in creating fears, excessive worry, and ruminations. 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 ?Patient to identify and utilize effective communication skills with others ?      Patient to report any concerns with his medications to his prescribing provider as needed. ? ?Progress: progressing ? ? ?Anson [place], [facility]  ?

## 2021-11-21 NOTE — Patient Instructions (Signed)
Blue-Emu cream -- use 2-3 times a day ? ?

## 2021-11-21 NOTE — Assessment & Plan Note (Addendum)
Re-test testosterone ?Options to treat discussed ? Potential benefits of a long term sex steroid  use as well as potential risks  and complications were explained to the patient and were aknowledged. ? ? ?

## 2021-11-21 NOTE — Assessment & Plan Note (Signed)
Stable

## 2021-11-21 NOTE — Assessment & Plan Note (Signed)
Blue-Emu cream was recommended to use 2-3 times a day ? ?

## 2021-11-23 LAB — TESTOSTERONE, FREE, TOTAL, SHBG
Sex Hormone Binding: 26.3 nmol/L (ref 16.5–55.9)
Testosterone, Free: 5.3 pg/mL — ABNORMAL LOW (ref 6.8–21.5)
Testosterone: 217 ng/dL — ABNORMAL LOW (ref 264–916)

## 2021-11-25 ENCOUNTER — Encounter: Payer: Self-pay | Admitting: Internal Medicine

## 2021-11-25 ENCOUNTER — Other Ambulatory Visit: Payer: Self-pay | Admitting: Internal Medicine

## 2021-11-25 ENCOUNTER — Telehealth: Payer: Self-pay | Admitting: Internal Medicine

## 2021-11-25 MED ORDER — XYOSTED 75 MG/0.5ML ~~LOC~~ SOAJ
75.0000 mg | SUBCUTANEOUS | 5 refills | Status: DC
Start: 2021-11-25 — End: 2021-12-08

## 2021-11-25 NOTE — Telephone Encounter (Signed)
Has a question about Testosterone Enanthate (XYOSTED) 75 MG/0.5ML SOAJ ? ?Received rx for 1.96 mL. Packaging is for 49m. Needing to confirm whether is the correct dosage or if it needs to be adjusted.  ? ?Please send in new script or give pharmacy a call.  ?

## 2021-11-26 NOTE — Telephone Encounter (Signed)
MD sent new rx for testosterone to [facility].Marland KitchenJohny Chess ?

## 2021-11-26 NOTE — Telephone Encounter (Signed)
It should be correct -it is how it is preset in the system.  It is a fixed dose. ?Thanks ?

## 2021-12-02 ENCOUNTER — Encounter: Payer: Self-pay | Admitting: Internal Medicine

## 2021-12-05 ENCOUNTER — Ambulatory Visit (INDEPENDENT_AMBULATORY_CARE_PROVIDER_SITE_OTHER): Payer: BC Managed Care – PPO | Admitting: Mental Health

## 2021-12-05 DIAGNOSIS — F319 Bipolar disorder, unspecified: Secondary | ICD-10-CM | POA: Diagnosis not present

## 2021-12-05 NOTE — Progress Notes (Signed)
Psychotherapy Note ? ?Name: Francisco Morgan ?Date:  12/05/21 ?MRN: [id number] ?DOB: 29-Mar-1976 ?PCP: Plotnikov, Evie Lacks, MD ? ?Time spent: 54 minutes ? ?Treatment: Individual therapy ? ?Mental Status Exam: Patient ?Appearance:    casual  ?Behavior:   appropriate  ?Motor:   WNL  ?Speech/Language:  ?   Clear, coherent  ?Affect:   Full range  ?Mood:   euthymic  ?Thought process:   normal  ?Thought content:     WNL  ?Sensory/Perceptual disturbances:     WNL  ?Orientation:   x4  ?Attention:   Good  ?Concentration:   Good  ?Memory:   WNL  ?Fund of knowledge:    WNL  ?Insight:     good  ?Judgment:    good  ?Impulse Control:   good  ? ?Reported Symptoms:  Decreased sleep intermittently, some anxiety and irritability, ruminations ? ?Risk Assessment: ?Danger to Self:  No ?Self-injurious Behavior: No ?Danger to Others: No ?Duty to Warn:no ?Physical Aggression / Violence:No  ?Access to Firearms a concern: No  ?Gang Involvement:No  ?Patient / guardian was educated about steps to take if suicide or homicide risk level increases between visits: yes ?While future psychiatric events cannot be accurately predicted, the patient does not currently require acute inpatient psychiatric care and does not currently meet [facility] involuntary commitment criteria. ? ?Medications: ?Current Outpatient Medications  ?Medication Sig Dispense Refill  ? b complex vitamins tablet Take 1 tablet by mouth daily. 100 tablet 3  ? celecoxib (CELEBREX) 200 MG capsule Take 1 capsule (200 mg total) by mouth 2 (two) times daily. Must keep schedule appt for future refills 60 capsule 3  ? Cholecalciferol (VITAMIN D) 2000 units CAPS 1 daily 100 capsule 3  ? clonazePAM (KLONOPIN) 0.5 MG tablet TAKE ONE TABLET BY MOUTH THREE TIMES DAILY AS NEEDED FOR ANXIETY. 60 tablet 2  ? clotrimazole-betamethasone (LOTRISONE) cream Apply 1 application topically 3 (three) times daily as needed. 30 g 1  ? divalproex (DEPAKOTE) 500 MG DR tablet Take 4 tablets (2,000 mg total)  by mouth at bedtime. 360 tablet 1  ? esomeprazole (NEXIUM) 40 MG capsule Take 1 capsule (40 mg total) by mouth daily before breakfast. 90 capsule 4  ? HUMIRA PEN 40 MG/0.4ML PNKT Inject 40 mg into the muscle every 14 (fourteen) days.   1  ? lamoTRIgine (LAMICTAL) 200 MG tablet Take 1 tablet (200 mg total) by mouth daily. 90 tablet 0  ? methylphenidate (RITALIN) 10 MG tablet TAKE ONE TABLET 3d wm 270 tablet 0  ? methylphenidate 36 MG PO CR tablet Take 1 tablet (36 mg total) by mouth daily. 90 tablet 0  ? Testosterone Enanthate (XYOSTED) 75 MG/0.5ML SOAJ Inject 75 mg into the skin once a week. 1.96 mL 5  ? ?No current facility-administered medications for this visit.  ? ? ?Allergies  ?Allergen Reactions  ? Nsaids Other (See Comments)  ?  Constipation  ? ?Subjective:  ?Patient presents for today's session in no distress.  Explored recent events in progress.  Patient presents less stressed, not focusing on his current situation, living in this area and financial challenges.  He expresses some contentment working on his parents renovation property, at this point this continues to be his primary focus, his real Cytogeneticist.  He shared recent experiences, enjoys researching various topics online which complements his significant grasp of knowledge on many topics.  Family relationships were assessed, no recent strain in relationships with his parents.  He was able to identify  ways that he tries to avoid having any verbal arguments with his mother.  He continues to want some sense of validation from his parents, specifically his mother in the areas of knowledge in real estate, Naval architect.  Reports some difficulty at times with recalling concepts or specific words, attributing this to potential medication side effects which he has discussed at his med management appointments.  Reports that overall his mood is stable and we encouraged self-care towards getting proper rest, having a consistent  sleep schedule and keeping his doctor appointments. ? ? ? ? ?Interventions: supportive therapy ? ?Diagnoses:  ?  ICD-10-CM   ?1. Bipolar affective disorder, rapid cycling ([place])  F31.9   ?  ? ? ? ? ? ? ? ?Plan: Patient is to use CBT, mindfulness and coping skills to help manage decrease symptoms associated with their diagnosis.   Continue to take steps toward effective communication in his relationship with is parents to keep stress low.   ?  ?Long-term goal:   ?Reduce overall level, frequency, and intensity of the feelings of depression, anxiety evidenced by  decreased irritability, negative self talk, and improved functioning up to 80% of the time as reported by patient for at least 3 consecutive months.  ?  ?Short-term goal:  ?Verbally express understanding of the relationship between feelings of depression, anxiety and their impact on thinking patterns and behaviors. ?Verbalize an understanding of the role that distorted thinking plays in creating fears, excessive worry, and ruminations. 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 ?Patient to identify and utilize effective communication skills with others ?      Patient to report any concerns with his medications to his prescribing provider as needed. ? ?Progress: progressing ? ? ?Anson [place], [facility]  ?

## 2021-12-07 ENCOUNTER — Other Ambulatory Visit: Payer: Self-pay | Admitting: Psychiatry

## 2021-12-07 DIAGNOSIS — F319 Bipolar disorder, unspecified: Secondary | ICD-10-CM

## 2021-12-07 DIAGNOSIS — F4310 Post-traumatic stress disorder, unspecified: Secondary | ICD-10-CM

## 2021-12-08 ENCOUNTER — Other Ambulatory Visit: Payer: Self-pay | Admitting: Internal Medicine

## 2021-12-08 DIAGNOSIS — R7989 Other specified abnormal findings of blood chemistry: Secondary | ICD-10-CM

## 2021-12-08 MED ORDER — TESTOSTERONE ENANTHATE 200 MG/ML IJ SOLN: 200.0000 mg | INTRAMUSCULAR | 3 refills | Status: DC

## 2021-12-08 NOTE — Progress Notes (Signed)
Yes that is confirmed as '200mg'$ /ml testosterone enanthate 87m vial for ~$32 at [facility]. If you would [facility] that [facility] to [facility] [address] please. And I?m assuming needles will required.  ?

## 2021-12-19 ENCOUNTER — Ambulatory Visit (INDEPENDENT_AMBULATORY_CARE_PROVIDER_SITE_OTHER): Payer: BC Managed Care – PPO | Admitting: Mental Health

## 2021-12-19 DIAGNOSIS — F319 Bipolar disorder, unspecified: Secondary | ICD-10-CM

## 2021-12-19 NOTE — Progress Notes (Signed)
Psychotherapy Note  Name: Francisco Morgan Date:  12/19/21 MRN: [id number] DOB: [birth year] PCP: [name], MD  Time spent: 55 minutes  Treatment: Individual therapy  Mental Status Exam: Patient Appearance:    casual  Behavior:   appropriate  Motor:   WNL  Speech/Language:     Clear, coherent  Affect:   Full range  Mood:   euthymic  Thought process:   normal  Thought content:     WNL  Sensory/Perceptual disturbances:     WNL  Orientation:   x4  Attention:   Good  Concentration:   Good  Memory:   WNL  Fund of knowledge:    WNL  Insight:     good  Judgment:    good  Impulse Control:   good   Reported Symptoms:  Decreased sleep intermittently, some anxiety and irritability, ruminations  Risk Assessment: Danger to Self:  No Self-injurious Behavior: No Danger to Others: No Duty to Warn:no Physical Aggression / Violence:No  Access to Firearms a concern: No  Gang Involvement:No  Patient / guardian was educated about steps to take if suicide or homicide risk level increases between visits: yes While future psychiatric events cannot be accurately predicted, the patient does not currently require acute inpatient psychiatric care and does not currently meet [facility] involuntary commitment criteria.  Medications: Current Outpatient Medications  Medication Sig Dispense Refill   b complex vitamins tablet Take 1 tablet by mouth daily. 100 tablet 3   celecoxib (CELEBREX) 200 MG capsule Take 1 capsule (200 mg total) by mouth 2 (two) times daily. Must keep schedule appt for future refills 60 capsule 3   Cholecalciferol (VITAMIN D) 2000 units CAPS 1 daily 100 capsule 3   clonazePAM (KLONOPIN) 0.5 MG tablet TAKE ONE TABLET BY MOUTH THREE TIMES DAILY AS NEEDED FOR ANXIETY. 60 tablet 2   divalproex (DEPAKOTE) 500 MG DR tablet Take 4 tablets (2,000 mg total) by mouth at bedtime. 360 tablet 1   esomeprazole (NEXIUM) 40 MG capsule Take 1 capsule (40 mg total) by mouth daily  before breakfast. 90 capsule 4   HUMIRA PEN 40 MG/0.4ML PNKT Inject 40 mg into the muscle every 14 (fourteen) days.   1   lamoTRIgine (LAMICTAL) 200 MG tablet Take 1 tablet (200 mg total) by mouth daily. 90 tablet 0   methylphenidate (RITALIN) 10 MG tablet TAKE ONE TABLET 3d wm 270 tablet 0   methylphenidate 36 MG PO CR tablet Take 1 tablet (36 mg total) by mouth daily. 90 tablet 0   Testosterone Enanthate 200 MG/ML SOLN Inject 200 mg into the muscle every 14 (fourteen) days. 5 mL 3   No current facility-administered medications for this visit.    Allergies  Allergen Reactions   Nsaids Other (See Comments)    Constipation   Subjective:  Patient presents for today's session.  Progress toward goals as well as relevant recent events was explored.  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  He stated he feels he will be able to meet the deadline which is an approximately 2 weeks.  Reports that he went to see one of his doctors and is now prescribed testosterone supplement to address his low testosterone levels.  Continues to work on his parents real estate remodeling project.  Making efforts to balance the physical demands of work along with subsequent pain levels that can occur due to blisters, recurrent  gout condition.  States he is adjusting well to restarting Depakote as he attended a recent medication management appointment.  Reports some ongoing relational stress with his mother.  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  Ways to communicate with his mother when she becomes agitated or critical was shared.  Some ways to manage stress and responses in these communications was explored collaboratively.    Interventions: supportive therapy  Diagnoses:    ICD-10-CM   1. Bipolar affective disorder, rapid cycling  ([place])  F31.9          Plan: Patient is to use CBT, mindfulness and coping skills to help manage decrease symptoms associated with their diagnosis.   Continue to take steps toward effective communication in his relationship with is parents to keep stress low.     Long-term goal:   Reduce overall level, frequency, and intensity of the feelings of depression, anxiety evidenced by  decreased irritability, negative self talk, and improved functioning up to 80% of the time as reported by patient for at least 3 consecutive months.    Short-term goal:  Verbally express understanding of the relationship between feelings of depression, anxiety and their impact on thinking patterns and behaviors. Verbalize an understanding of the role that distorted thinking plays in creating fears, excessive worry, and ruminations. 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 Patient to identify and utilize effective communication skills with others       Patient to report any concerns with his medications to his prescribing provider as needed.  Progress: progressing   Anson [place], [facility]

## 2021-12-27 ENCOUNTER — Encounter: Payer: Self-pay | Admitting: Internal Medicine

## 2022-01-02 ENCOUNTER — Ambulatory Visit (INDEPENDENT_AMBULATORY_CARE_PROVIDER_SITE_OTHER): Payer: BC Managed Care – PPO | Admitting: Mental Health

## 2022-01-02 DIAGNOSIS — F319 Bipolar disorder, unspecified: Secondary | ICD-10-CM | POA: Diagnosis not present

## 2022-01-02 NOTE — Progress Notes (Signed)
Psychotherapy Note  Name: JAVARIOUS ELSAYED Date:  01/02/22 MRN: [id number] DOB: 11/29/1975 PCP: [name], MD  Time spent: 54 minutes  Treatment: Individual therapy  Mental Status Exam: Patient Appearance:    casual  Behavior:   appropriate  Motor:   WNL  Speech/Language:     Clear, coherent  Affect:   Full range  Mood:   euthymic  Thought process:   normal  Thought content:     WNL  Sensory/Perceptual disturbances:     WNL  Orientation:   x4  Attention:   Good  Concentration:   Good  Memory:   WNL  Fund of knowledge:    WNL  Insight:     good  Judgment:    good  Impulse Control:   good   Reported Symptoms:  Decreased sleep intermittently, some anxiety and irritability, ruminations  Risk Assessment: Danger to Self:  No Self-injurious Behavior: No Danger to Others: No Duty to Warn:no Physical Aggression / Violence:No  Access to Firearms a concern: No  Gang Involvement:No  Patient / guardian was educated about steps to take if suicide or homicide risk level increases between visits: yes While future psychiatric events cannot be accurately predicted, the patient does not currently require acute inpatient psychiatric care and does not currently meet [facility] involuntary commitment criteria.  Medications: Current Outpatient Medications  Medication Sig Dispense Refill   b complex vitamins tablet Take 1 tablet by mouth daily. 100 tablet 3   celecoxib (CELEBREX) 200 MG capsule Take 1 capsule (200 mg total) by mouth 2 (two) times daily. Must keep schedule appt for future refills 60 capsule 3   Cholecalciferol (VITAMIN D) 2000 units CAPS 1 daily 100 capsule 3   clonazePAM (KLONOPIN) 0.5 MG tablet TAKE ONE TABLET BY MOUTH THREE TIMES DAILY AS NEEDED FOR ANXIETY. 60 tablet 2   divalproex (DEPAKOTE) 500 MG DR tablet Take 4 tablets (2,000 mg total) by mouth at bedtime. 360 tablet 1   esomeprazole (NEXIUM) 40 MG capsule Take 1 capsule (40 mg total) by mouth daily  before breakfast. 90 capsule 4   HUMIRA PEN 40 MG/0.4ML PNKT Inject 40 mg into the muscle every 14 (fourteen) days.   1   lamoTRIgine (LAMICTAL) 200 MG tablet Take 1 tablet (200 mg total) by mouth daily. 90 tablet 0   methylphenidate (RITALIN) 10 MG tablet TAKE ONE TABLET 3d wm 270 tablet 0   methylphenidate 36 MG PO CR tablet Take 1 tablet (36 mg total) by mouth daily. 90 tablet 0   Testosterone Enanthate 200 MG/ML SOLN Inject 200 mg into the muscle every 14 (fourteen) days. 5 mL 3   No current facility-administered medications for this visit.    Allergies  Allergen Reactions   Nsaids Other (See Comments)    Constipation   Subjective:  Patient presents for today's session.     Interventions: supportive therapy  Diagnoses:  No diagnosis found.      Plan: Patient is to use CBT, mindfulness and coping skills to help manage decrease symptoms associated with their diagnosis.   Continue to take steps toward effective communication in his relationship with is parents to keep stress low.     Long-term goal:   Reduce overall level, frequency, and intensity of the feelings of depression, anxiety evidenced by  decreased irritability, negative self talk, and improved functioning up to 80% of the time as reported by patient for at least 3 consecutive months.    Short-term goal:  Verbally  express understanding of the relationship between feelings of depression, anxiety and their impact on thinking patterns and behaviors. Verbalize an understanding of the role that distorted thinking plays in creating fears, excessive worry, and ruminations. 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 Patient to identify and utilize effective communication skills with others       Patient to report any concerns with his medications to his prescribing provider as needed.  Progress:  progressing   Anson [place], [facility]

## 2022-01-08 DIAGNOSIS — Z1589 Genetic susceptibility to other disease: Secondary | ICD-10-CM | POA: Diagnosis not present

## 2022-01-08 DIAGNOSIS — L400 Psoriasis vulgaris: Secondary | ICD-10-CM | POA: Diagnosis not present

## 2022-01-08 DIAGNOSIS — L4059 Other psoriatic arthropathy: Secondary | ICD-10-CM | POA: Diagnosis not present

## 2022-01-08 DIAGNOSIS — M1A09X0 Idiopathic chronic gout, multiple sites, without tophus (tophi): Secondary | ICD-10-CM | POA: Diagnosis not present

## 2022-01-14 ENCOUNTER — Telehealth: Payer: Self-pay | Admitting: Psychiatry

## 2022-01-14 DIAGNOSIS — F902 Attention-deficit hyperactivity disorder, combined type: Secondary | ICD-10-CM

## 2022-01-15 ENCOUNTER — Telehealth: Payer: Self-pay | Admitting: Psychiatry

## 2022-01-15 NOTE — Telephone Encounter (Signed)
Pt called requesting a letter from you to attach to [facility] medical review form. You had written a letter few years ago. Only have 20 days to return. Would need letter next week. Contact Pt when ready for pickup.

## 2022-01-15 NOTE — Telephone Encounter (Signed)
No need to call him on this

## 2022-01-15 NOTE — Telephone Encounter (Signed)
LVM to rtc 

## 2022-01-16 ENCOUNTER — Ambulatory Visit: Payer: BC Managed Care – PPO | Admitting: Mental Health

## 2022-01-17 ENCOUNTER — Other Ambulatory Visit: Payer: Self-pay | Admitting: Psychiatry

## 2022-01-17 DIAGNOSIS — F902 Attention-deficit hyperactivity disorder, combined type: Secondary | ICD-10-CM

## 2022-01-17 MED ORDER — METHYLPHENIDATE HCL 10 MG PO TABS: ORAL_TABLET | ORAL | 0 refills | Status: DC

## 2022-01-17 NOTE — Telephone Encounter (Signed)
Caryn Bee, Pharmacist at [facility] in [place] called to say he only has 227 pills of Methylphenidate 10mg  for pt.  He will be 43 pills short.  He wants [place] to send a new script for the 43 because has to close it out with the 227 he is going to provide for him this time.  Next appt 7/12

## 2022-01-21 ENCOUNTER — Other Ambulatory Visit: Payer: Self-pay | Admitting: Psychiatry

## 2022-01-21 ENCOUNTER — Ambulatory Visit (INDEPENDENT_AMBULATORY_CARE_PROVIDER_SITE_OTHER): Payer: BC Managed Care – PPO | Admitting: Orthopedic Surgery

## 2022-01-21 ENCOUNTER — Other Ambulatory Visit: Payer: Self-pay

## 2022-01-21 DIAGNOSIS — F902 Attention-deficit hyperactivity disorder, combined type: Secondary | ICD-10-CM

## 2022-01-21 DIAGNOSIS — M25871 Other specified joint disorders, right ankle and foot: Secondary | ICD-10-CM

## 2022-01-21 MED ORDER — METHYLPHENIDATE HCL 10 MG PO TABS: ORAL_TABLET | ORAL | 0 refills | Status: DC

## 2022-01-21 NOTE — Telephone Encounter (Signed)
Cancelled and pended  

## 2022-01-23 ENCOUNTER — Encounter: Payer: Self-pay | Admitting: Orthopedic Surgery

## 2022-01-23 ENCOUNTER — Encounter: Payer: Self-pay | Admitting: Internal Medicine

## 2022-01-23 DIAGNOSIS — M25871 Other specified joint disorders, right ankle and foot: Secondary | ICD-10-CM | POA: Diagnosis not present

## 2022-01-23 MED ORDER — METHYLPREDNISOLONE ACETATE 40 MG/ML IJ SUSP
40.0000 mg | INTRAMUSCULAR | Status: AC | PRN
  Administered 2022-01-23: 40 mg via INTRA_ARTICULAR

## 2022-01-23 MED ORDER — LIDOCAINE HCL 1 % IJ SOLN
2.0000 mL | INTRAMUSCULAR | Status: AC | PRN
  Administered 2022-01-23: 2 mL

## 2022-01-23 NOTE — Telephone Encounter (Signed)
Place on MD desk in yellow envelope.Marland KitchenJohny Morgan
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
## 2022-01-23 NOTE — Telephone Encounter (Signed)
PT has left this paperwork with me to be filled out. Forms have been left in Dr.Plotnikov's mailbox.

## 2022-01-27 DIAGNOSIS — Z0289 Encounter for other administrative examinations: Secondary | ICD-10-CM

## 2022-01-29 ENCOUNTER — Encounter: Payer: Self-pay | Admitting: Internal Medicine

## 2022-01-29 ENCOUNTER — Other Ambulatory Visit (INDEPENDENT_AMBULATORY_CARE_PROVIDER_SITE_OTHER): Payer: BC Managed Care – PPO

## 2022-01-29 ENCOUNTER — Encounter: Payer: BC Managed Care – PPO | Admitting: Internal Medicine

## 2022-01-29 DIAGNOSIS — R7989 Other specified abnormal findings of blood chemistry: Secondary | ICD-10-CM | POA: Diagnosis not present

## 2022-01-29 DIAGNOSIS — M9907 Segmental and somatic dysfunction of upper extremity: Secondary | ICD-10-CM | POA: Diagnosis not present

## 2022-01-29 DIAGNOSIS — M9902 Segmental and somatic dysfunction of thoracic region: Secondary | ICD-10-CM | POA: Diagnosis not present

## 2022-01-29 DIAGNOSIS — M9905 Segmental and somatic dysfunction of pelvic region: Secondary | ICD-10-CM | POA: Diagnosis not present

## 2022-01-29 DIAGNOSIS — M1A00X0 Idiopathic chronic gout, unspecified site, without tophus (tophi): Secondary | ICD-10-CM | POA: Insufficient documentation

## 2022-01-29 DIAGNOSIS — M9903 Segmental and somatic dysfunction of lumbar region: Secondary | ICD-10-CM | POA: Diagnosis not present

## 2022-01-29 LAB — COMPREHENSIVE METABOLIC PANEL
ALT: 16 U/L (ref 0–53)
AST: 18 U/L (ref 0–37)
Albumin: 4.9 g/dL (ref 3.5–5.2)
Alkaline Phosphatase: 55 U/L (ref 39–117)
BUN: 16 mg/dL (ref 6–23)
CO2: 28 mEq/L (ref 19–32)
Calcium: 9.9 mg/dL (ref 8.4–10.5)
Chloride: 100 mEq/L (ref 96–112)
Creatinine, Ser: 0.96 mg/dL (ref 0.40–1.50)
GFR: 95.72 mL/min (ref >60.00)
Glucose, Bld: 115 mg/dL — ABNORMAL HIGH (ref 70–99)
Potassium: 3.8 mEq/L (ref 3.5–5.1)
Sodium: 139 mEq/L (ref 135–145)
Total Bilirubin: 0.9 mg/dL (ref 0.2–1.2)
Total Protein: 7.4 g/dL (ref 6.0–8.3)

## 2022-01-29 LAB — TESTOSTERONE: Testosterone: 111.52 ng/dL — ABNORMAL LOW (ref 300.00–890.00)

## 2022-01-30 ENCOUNTER — Encounter: Payer: Self-pay | Admitting: Internal Medicine

## 2022-01-30 ENCOUNTER — Ambulatory Visit (INDEPENDENT_AMBULATORY_CARE_PROVIDER_SITE_OTHER): Payer: BC Managed Care – PPO | Admitting: Mental Health

## 2022-01-30 DIAGNOSIS — F319 Bipolar disorder, unspecified: Secondary | ICD-10-CM
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
## 2022-01-31 DIAGNOSIS — M9905 Segmental and somatic dysfunction of pelvic region: Secondary | ICD-10-CM | POA: Diagnosis not present

## 2022-01-31 DIAGNOSIS — M9903 Segmental and somatic dysfunction of lumbar region: Secondary | ICD-10-CM | POA: Diagnosis not present

## 2022-01-31 DIAGNOSIS — M9907 Segmental and somatic dysfunction of upper extremity: Secondary | ICD-10-CM | POA: Diagnosis not present

## 2022-01-31 DIAGNOSIS — M9902 Segmental and somatic dysfunction of thoracic region: Secondary | ICD-10-CM | POA: Diagnosis not present

## 2022-02-03 ENCOUNTER — Encounter: Payer: Self-pay | Admitting: Internal Medicine

## 2022-02-04 ENCOUNTER — Other Ambulatory Visit: Payer: Self-pay | Admitting: Internal Medicine

## 2022-02-04 DIAGNOSIS — R7989 Other specified abnormal findings of blood chemistry: Secondary | ICD-10-CM

## 2022-02-04 MED ORDER — TESTOSTERONE ENANTHATE 200 MG/ML IJ SOLN: 200.0000 mg | INTRAMUSCULAR | 3 refills | Status: DC

## 2022-02-04 MED ORDER — "EASY TOUCH SAFETY SYRINGE 22G X 1"" 3 ML MISC": 3 refills | Status: DC

## 2022-02-04 NOTE — Telephone Encounter (Signed)
Rec'd msg stating "WE CANNOT COMPOUND THIS FORMULATION AND THE ONLY THING AVAILABLE TO [place] IS THE TESTOSTERONE CYPIONATE INJECTION FORMULATION '200MG'$ /ML FROM OUR WHOLESALER. PLEASE ADVISE?

## 2022-02-05 ENCOUNTER — Ambulatory Visit (INDEPENDENT_AMBULATORY_CARE_PROVIDER_SITE_OTHER): Payer: BC Managed Care – PPO | Admitting: Psychiatry

## 2022-02-05 ENCOUNTER — Encounter: Payer: Self-pay | Admitting: Psychiatry

## 2022-02-05 DIAGNOSIS — F319 Bipolar disorder, unspecified: Secondary | ICD-10-CM

## 2022-02-05 DIAGNOSIS — F902 Attention-deficit hyperactivity disorder, combined type: Secondary | ICD-10-CM | POA: Diagnosis not present

## 2022-02-05 DIAGNOSIS — F4310 Post-traumatic stress disorder, unspecified: Secondary | ICD-10-CM | POA: Diagnosis not present
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
## 2022-02-06 DIAGNOSIS — M9903 Segmental and somatic dysfunction of lumbar region: Secondary | ICD-10-CM | POA: Diagnosis not present

## 2022-02-06 DIAGNOSIS — M9902 Segmental and somatic dysfunction of thoracic region: Secondary | ICD-10-CM | POA: Diagnosis not present

## 2022-02-06 DIAGNOSIS — M9907 Segmental and somatic dysfunction of upper extremity: Secondary | ICD-10-CM | POA: Diagnosis not present

## 2022-02-06 DIAGNOSIS — M9905 Segmental and somatic dysfunction of pelvic region: Secondary | ICD-10-CM | POA: Diagnosis not present

## 2022-02-07 ENCOUNTER — Ambulatory Visit: Payer: BC Managed Care – PPO | Admitting: Physician Assistant

## 2022-02-07 ENCOUNTER — Encounter: Payer: Self-pay | Admitting: Physician Assistant

## 2022-02-07 DIAGNOSIS — M1A00X0 Idiopathic chronic gout, unspecified site, without tophus (tophi): Secondary | ICD-10-CM | POA: Diagnosis not present
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
## 2022-02-20 DIAGNOSIS — M9902 Segmental and somatic dysfunction of thoracic region: Secondary | ICD-10-CM | POA: Diagnosis not present

## 2022-02-20 DIAGNOSIS — M9903 Segmental and somatic dysfunction of lumbar region: Secondary | ICD-10-CM | POA: Diagnosis not present

## 2022-02-20 DIAGNOSIS — M9905 Segmental and somatic dysfunction of pelvic region: Secondary | ICD-10-CM | POA: Diagnosis not present

## 2022-02-20 DIAGNOSIS — M9907 Segmental and somatic dysfunction of upper extremity: Secondary | ICD-10-CM | POA: Diagnosis not present

## 2022-02-27 ENCOUNTER — Ambulatory Visit (INDEPENDENT_AMBULATORY_CARE_PROVIDER_SITE_OTHER): Payer: BC Managed Care – PPO | Admitting: Mental Health

## 2022-02-27 DIAGNOSIS — F319 Bipolar disorder, unspecified: Secondary | ICD-10-CM
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
## 2022-03-05 ENCOUNTER — Other Ambulatory Visit: Payer: Self-pay | Admitting: Internal Medicine

## 2022-03-05 ENCOUNTER — Other Ambulatory Visit: Payer: Self-pay | Admitting: Psychiatry

## 2022-03-05 DIAGNOSIS — F310 Bipolar disorder, current episode hypomanic: Secondary | ICD-10-CM

## 2022-03-12 ENCOUNTER — Telehealth: Payer: Self-pay | Admitting: Internal Medicine

## 2022-03-12 NOTE — Telephone Encounter (Signed)
Patient states that you had upped his testosterone dose - so he can not get a refill because they have the old rx - Please send new rx to [facility] on [address] in [place], [place]

## 2022-03-13 ENCOUNTER — Ambulatory Visit: Payer: BC Managed Care – PPO | Admitting: Mental Health

## 2022-03-13 MED ORDER — TESTOSTERONE ENANTHATE 200 MG/ML IJ SOLN: 200.0000 mg | INTRAMUSCULAR | 3 refills | Status: DC

## 2022-03-13 NOTE — Telephone Encounter (Signed)
I resent the prescription to [facility].  We may have sent this prescription to [facility] in July. Thanks

## 2022-03-13 NOTE — Telephone Encounter (Signed)
Notified pt rx re-sent to [facility].../[facility]MB 

## 2022-03-15 ENCOUNTER — Other Ambulatory Visit: Payer: Self-pay | Admitting: Psychiatry

## 2022-03-15 DIAGNOSIS — F319 Bipolar disorder, unspecified: Secondary | ICD-10-CM

## 2022-03-15 DIAGNOSIS — F4310 Post-traumatic stress disorder, unspecified: Secondary | ICD-10-CM

## 2022-03-27 ENCOUNTER — Ambulatory Visit (INDEPENDENT_AMBULATORY_CARE_PROVIDER_SITE_OTHER): Payer: BC Managed Care – PPO | Admitting: Mental Health

## 2022-03-27 DIAGNOSIS — F319 Bipolar disorder, unspecified: Secondary | ICD-10-CM | POA: Diagnosis not present

## 2022-03-27 NOTE — Progress Notes (Signed)
Psychotherapy Note  Name: Francisco Morgan Date:  03/27/22 MRN: [id number] DOB: 1976-03-16 PCP: [name], MD  Time spent: 52 minutes  Treatment: Individual therapy  Mental Status Exam: Patient Appearance:    casual  Behavior:   appropriate  Motor:   WNL  Speech/Language:     Clear, coherent  Affect:   Full range  Mood:   euthymic  Thought process:   normal  Thought content:     WNL  Sensory/Perceptual disturbances:     WNL  Orientation:   x4  Attention:   Good  Concentration:   Good  Memory:   WNL  Fund of knowledge:    WNL  Insight:     good  Judgment:    good  Impulse Control:   good   Reported Symptoms:  Decreased sleep intermittently, some anxiety and irritability, ruminations  Risk Assessment: Danger to Self:  No Self-injurious Behavior: No Danger to Others: No Duty to Warn:no Physical Aggression / Violence:No  Access to Firearms a concern: No  Gang Involvement:No  Patient / guardian was educated about steps to take if suicide or homicide risk level increases between visits: yes While future psychiatric events cannot be accurately predicted, the patient does not currently require acute inpatient psychiatric care and does not currently meet [facility] involuntary commitment criteria.  Medications: Current Outpatient Medications  Medication Sig Dispense Refill   lamoTRIgine (LAMICTAL) 200 MG tablet TAKE ONE TABLET BY MOUTH DAILY 60 tablet 0   b complex vitamins tablet Take 1 tablet by mouth daily. 100 tablet 3   celecoxib (CELEBREX) 200 MG capsule Take 1 capsule (200 mg total) by mouth 2 (two) times daily. Please call our office to schedule a follow up to receive additional refills. 60 capsule 0   Cholecalciferol (VITAMIN D) 2000 units CAPS 1 daily 100 capsule 3   clonazePAM (KLONOPIN) 0.5 MG tablet TAKE ONE TABLET BY MOUTH THREE TIMES DAILY AS NEEDED FOR ANXIETY. 60 tablet 2   divalproex (DEPAKOTE) 500 MG DR tablet Take 4 tablets (2,000 mg total)  by mouth at bedtime. 360 tablet 1   esomeprazole (NEXIUM) 40 MG capsule Take 1 capsule (40 mg total) by mouth daily before breakfast. 90 capsule 4   HUMIRA PEN 40 MG/0.4ML PNKT Inject 40 mg into the muscle every 14 (fourteen) days.   1   methylphenidate (RITALIN) 10 MG tablet TAKE 1 TABLET BY MOUTH 3 TIMES A DAY WITH MEALS 43 tablet 0   METHYLPHENIDATE 36 MG PO CR tablet TAKE ONE TABLET BY MOUTH ONE TIME DAILY 90 tablet 0   SYRINGE-NEEDLE, DISP, 3 ML (EASY TOUCH SAFETY SYRINGE) 22G X 1" 3 ML MISC As directed for IM injections 50 each 3   Testosterone Enanthate 200 MG/ML SOLN Inject 200 mg into the muscle once a week. 10 mL 3   No current facility-administered medications for this visit.    Allergies  Allergen Reactions   Nsaids Other (See Comments)    Constipation   Subjective:  Patient presents for today's session.  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  He stated that he has hired a Insurance underwriter recently to assist him in the process, however, this led to some disagreements with his mother which were shared.  Patient stated that his father continues to identify an individual that could be helpful that has some skills and experience however, patient stated that the individual does not appear to have the adequate experience needed.  Patient  identified how he continues to struggle financially, would like to attend a music venue in the next month however, due to finances plans to sell his admission ticket.  Patient continues to also work on how he responds to stress when having disagreements with his parents, how he recently was successful in allowing his father to vent his frustrations without it escalating between them.     Interventions: supportive therapy, problem solving  Diagnoses:    ICD-10-CM   1. Bipolar affective disorder, rapid cycling ([place])  F31.9            Plan: Patient is to use CBT, mindfulness and coping skills to help manage decrease  symptoms associated with their diagnosis.   Continue to take steps toward effective communication in his relationship with is parents to keep stress low.     Long-term goal:   Reduce overall level, frequency, and intensity of the feelings of depression, anxiety evidenced by  decreased irritability, negative self talk, and improved functioning up to 80% of the time as reported by patient for at least 3 consecutive months.    Short-term goal:  Verbally express understanding of the relationship between feelings of depression, anxiety and their impact on thinking patterns and behaviors. Verbalize an understanding of the role that distorted thinking plays in creating fears, excessive worry, and ruminations. 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 Patient to identify and utilize effective communication skills with others       Patient to report any concerns with his medications to his prescribing provider as needed.  Progress: progressing   Anson [place], [facility]

## 2022-04-14 ENCOUNTER — Other Ambulatory Visit: Payer: Self-pay

## 2022-04-14 ENCOUNTER — Telehealth: Payer: Self-pay | Admitting: Psychiatry

## 2022-04-14 ENCOUNTER — Encounter: Payer: Self-pay | Admitting: Internal Medicine

## 2022-04-14 ENCOUNTER — Other Ambulatory Visit: Payer: Self-pay | Admitting: Internal Medicine

## 2022-04-14 DIAGNOSIS — F902 Attention-deficit hyperactivity disorder, combined type: Secondary | ICD-10-CM

## 2022-04-14 DIAGNOSIS — M9902 Segmental and somatic dysfunction of thoracic region: Secondary | ICD-10-CM | POA: Diagnosis not present

## 2022-04-14 DIAGNOSIS — M9907 Segmental and somatic dysfunction of upper extremity: Secondary | ICD-10-CM | POA: Diagnosis not present

## 2022-04-14 DIAGNOSIS — M9903 Segmental and somatic dysfunction of lumbar region: Secondary | ICD-10-CM | POA: Diagnosis not present

## 2022-04-14 DIAGNOSIS — M9905 Segmental and somatic dysfunction of pelvic region: Secondary | ICD-10-CM | POA: Diagnosis not present

## 2022-04-14 NOTE — Telephone Encounter (Signed)
Pt called and said that he needs refills on his concerta 36 mg and his ritalin 10 mg. The pharmnacy is [facility] on [address]

## 2022-04-15 MED ORDER — METHYLPHENIDATE HCL ER (OSM) 36 MG PO TBCR: 36.0000 mg | EXTENDED_RELEASE_TABLET | Freq: Every day | ORAL | 0 refills | Status: DC

## 2022-04-15 MED ORDER — METHYLPHENIDATE HCL 10 MG PO TABS
ORAL_TABLET | ORAL | 0 refills | Status: DC
Start: 2022-04-15 — End: 2022-07-15

## 2022-04-15 NOTE — Telephone Encounter (Signed)
Pended.

## 2022-04-16 ENCOUNTER — Other Ambulatory Visit: Payer: Self-pay | Admitting: Internal Medicine

## 2022-04-16 MED ORDER — HYDROCORTISONE 2.5 % EX OINT: TOPICAL_OINTMENT | Freq: Two times a day (BID) | CUTANEOUS | 3 refills | Status: AC

## 2022-04-17 DIAGNOSIS — M9905 Segmental and somatic dysfunction of pelvic region: Secondary | ICD-10-CM | POA: Diagnosis not present

## 2022-04-17 DIAGNOSIS — M9907 Segmental and somatic dysfunction of upper extremity: Secondary | ICD-10-CM | POA: Diagnosis not present

## 2022-04-17 DIAGNOSIS — M9902 Segmental and somatic dysfunction of thoracic region: Secondary | ICD-10-CM | POA: Diagnosis not present

## 2022-04-17 DIAGNOSIS — M9903 Segmental and somatic dysfunction of lumbar region: Secondary | ICD-10-CM | POA: Diagnosis not present

## 2022-04-22 DIAGNOSIS — M9907 Segmental and somatic dysfunction of upper extremity: Secondary | ICD-10-CM | POA: Diagnosis not present

## 2022-04-22 DIAGNOSIS — M9903 Segmental and somatic dysfunction of lumbar region: Secondary | ICD-10-CM | POA: Diagnosis not present

## 2022-04-22 DIAGNOSIS — M9905 Segmental and somatic dysfunction of pelvic region: Secondary | ICD-10-CM | POA: Diagnosis not present

## 2022-04-22 DIAGNOSIS — M9902 Segmental and somatic dysfunction of thoracic region: Secondary | ICD-10-CM | POA: Diagnosis not present

## 2022-04-24 ENCOUNTER — Ambulatory Visit (INDEPENDENT_AMBULATORY_CARE_PROVIDER_SITE_OTHER): Payer: BC Managed Care – PPO | Admitting: Mental Health

## 2022-04-24 DIAGNOSIS — F319 Bipolar disorder, unspecified: Secondary | ICD-10-CM | POA: Diagnosis not present

## 2022-04-24 NOTE — Progress Notes (Signed)
Psychotherapy Note  Name: Francisco Morgan Date:  04/24/22 MRN: [id number] DOB: [birth year] PCP: [name], MD  Time spent: 50 minutes  Treatment: Individual therapy  Mental Status Exam: Patient Appearance:    casual  Behavior:   appropriate  Motor:   WNL  Speech/Language:     Clear, coherent  Affect:   Full range  Mood:   euthymic  Thought process:   normal  Thought content:     WNL  Sensory/Perceptual disturbances:     WNL  Orientation:   x4  Attention:   Good  Concentration:   Good  Memory:   WNL  Fund of knowledge:    WNL  Insight:     good  Judgment:    good  Impulse Control:   good   Reported Symptoms:  Decreased sleep intermittently, some anxiety and irritability, ruminations  Risk Assessment: Danger to Self:  No Self-injurious Behavior: No Danger to Others: No Duty to Warn:no Physical Aggression / Violence:No  Access to Firearms a concern: No  Gang Involvement:No  Patient / guardian was educated about steps to take if suicide or homicide risk level increases between visits: yes While future psychiatric events cannot be accurately predicted, the patient does not currently require acute inpatient psychiatric care and does not currently meet [facility] involuntary commitment criteria.  Medications: Current Outpatient Medications  Medication Sig Dispense Refill   lamoTRIgine (LAMICTAL) 200 MG tablet TAKE ONE TABLET BY MOUTH DAILY 60 tablet 0   b complex vitamins tablet Take 1 tablet by mouth daily. 100 tablet 3   celecoxib (CELEBREX) 200 MG capsule Take 1 capsule (200 mg total) by mouth 2 (two) times daily. Follow-up appt due in must see provider for future refills 60 capsule 0   Cholecalciferol (VITAMIN D) 2000 units CAPS 1 daily 100 capsule 3   clonazePAM (KLONOPIN) 0.5 MG tablet TAKE ONE TABLET BY MOUTH THREE TIMES DAILY AS NEEDED FOR ANXIETY. 60 tablet 2   divalproex (DEPAKOTE) 500 MG DR tablet Take 4 tablets (2,000 mg total) by mouth at  bedtime. 360 tablet 1   esomeprazole (NEXIUM) 40 MG capsule Take 1 capsule (40 mg total) by mouth daily before breakfast. 90 capsule 4   HUMIRA PEN 40 MG/0.4ML PNKT Inject 40 mg into the muscle every 14 (fourteen) days.   1   hydrocortisone 2.5 % ointment Apply topically 2 (two) times daily. 60 g 3   methylphenidate (RITALIN) 10 MG tablet TAKE 1 TABLET BY MOUTH 3 TIMES A DAY WITH MEALS 270 tablet 0   methylphenidate 36 MG PO CR tablet Take 1 tablet (36 mg total) by mouth daily. 90 tablet 0   SYRINGE-NEEDLE, DISP, 3 ML (EASY TOUCH SAFETY SYRINGE) 22G X 1" 3 ML MISC As directed for IM injections 50 each 3   Testosterone Enanthate 200 MG/ML SOLN Inject 200 mg into the muscle once a week. 10 mL 3   No current facility-administered medications for this visit.    Allergies  Allergen Reactions   Nsaids Other (See Comments)    Constipation   Subjective:  Patient presents for today's session.  Patient shared recent events and progress since last visit which was about 1 month ago.  He stated that he was able to attend a music event out of town a few weeks ago, went on to detail the experience where overall it was positive and how this is one of his enjoyable pastimes.  Assisted him in refocusing on current issues, needs.  [employer]  on to share a recent interaction with his parents related to advanced care planning in which it resulted in her having a disagreement.  Patient went on to share some details and support was provided along with facilitating his further clarifying some distressful feelings related.  Encouraged him to consider how he responded in the discussion; we plan to further discuss next visit.   Interventions: supportive therapy, problem solving  Diagnoses:    ICD-10-CM   1. Bipolar affective disorder, rapid cycling ([place])  F31.9             Plan: Patient is to use CBT, mindfulness and coping skills to help manage decrease symptoms associated with their diagnosis.   Continue to take  steps toward effective communication in his relationship with is parents to keep stress low.     Long-term goal:   Reduce overall level, frequency, and intensity of the feelings of depression, anxiety evidenced by  decreased irritability, negative self talk, and improved functioning up to 80% of the time as reported by patient for at least 3 consecutive months.    Short-term goal:  Verbally express understanding of the relationship between feelings of depression, anxiety and their impact on thinking patterns and behaviors. Verbalize an understanding of the role that distorted thinking plays in creating fears, excessive worry, and ruminations. 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 Patient to identify and utilize effective communication skills with others       Patient to report any concerns with his medications to his prescribing provider as needed.  Progress: progressing   Anson [place], [facility]

## 2022-04-29 ENCOUNTER — Ambulatory Visit: Payer: BC Managed Care – PPO | Admitting: Family

## 2022-04-29 DIAGNOSIS — M9903 Segmental and somatic dysfunction of lumbar region: Secondary | ICD-10-CM | POA: Diagnosis not present

## 2022-04-29 DIAGNOSIS — M9902 Segmental and somatic dysfunction of thoracic region: Secondary | ICD-10-CM | POA: Diagnosis not present

## 2022-04-29 DIAGNOSIS — M9907 Segmental and somatic dysfunction of upper extremity: Secondary | ICD-10-CM | POA: Diagnosis not present

## 2022-04-29 DIAGNOSIS — M9905 Segmental and somatic dysfunction of pelvic region: Secondary | ICD-10-CM | POA: Diagnosis not present

## 2022-04-30 ENCOUNTER — Ambulatory Visit: Payer: BC Managed Care – PPO | Admitting: Family

## 2022-04-30 DIAGNOSIS — M25871 Other specified joint disorders, right ankle and foot: Secondary | ICD-10-CM | POA: Diagnosis not present

## 2022-05-01 ENCOUNTER — Encounter: Payer: Self-pay | Admitting: Family

## 2022-05-01 DIAGNOSIS — M25871 Other specified joint disorders, right ankle and foot: Secondary | ICD-10-CM | POA: Diagnosis not present

## 2022-05-01 MED ORDER — LIDOCAINE HCL 1 % IJ SOLN
2.0000 mL | INTRAMUSCULAR | Status: AC | PRN
  Administered 2022-05-01: 2 mL

## 2022-05-01 MED ORDER — METHYLPREDNISOLONE ACETATE 40 MG/ML IJ SUSP
40.0000 mg | INTRAMUSCULAR | Status: AC | PRN
  Administered 2022-05-01: 40 mg via INTRA_ARTICULAR
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
## 2022-05-08 ENCOUNTER — Ambulatory Visit (INDEPENDENT_AMBULATORY_CARE_PROVIDER_SITE_OTHER): Payer: BC Managed Care – PPO | Admitting: Psychiatry

## 2022-05-08 ENCOUNTER — Encounter: Payer: Self-pay | Admitting: Psychiatry

## 2022-05-08 ENCOUNTER — Ambulatory Visit (INDEPENDENT_AMBULATORY_CARE_PROVIDER_SITE_OTHER): Payer: BC Managed Care – PPO | Admitting: Mental Health

## 2022-05-08 VITALS — BP 139/85 | HR 112

## 2022-05-08 DIAGNOSIS — F310 Bipolar disorder, current episode hypomanic: Secondary | ICD-10-CM | POA: Diagnosis not present

## 2022-05-08 DIAGNOSIS — F902 Attention-deficit hyperactivity disorder, combined type: Secondary | ICD-10-CM

## 2022-05-08 DIAGNOSIS — F4310 Post-traumatic stress disorder, unspecified: Secondary | ICD-10-CM

## 2022-05-08 DIAGNOSIS — F319 Bipolar disorder, unspecified: Secondary | ICD-10-CM

## 2022-05-08 MED ORDER — DIVALPROEX SODIUM 500 MG PO DR TAB: 2500.0000 mg | DELAYED_RELEASE_TABLET | Freq: Every day | ORAL | 1 refills | Status: DC
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
## 2022-05-08 NOTE — Progress Notes (Signed)
Psychotherapy Note  Name: Francisco Morgan Date:  05/07/22 MRN: [id number] DOB: 1975-11-28 PCP: [name], MD  Time spent: 54 minutes  Treatment: Individual therapy  Mental Status Exam: Patient Appearance:    casual  Behavior:   appropriate  Motor:   WNL  Speech/Language:     Clear, coherent  Affect:   Full range  Mood:   euthymic  Thought process:   normal  Thought content:     WNL  Sensory/Perceptual disturbances:     WNL  Orientation:   x4  Attention:   Good  Concentration:   Good  Memory:   WNL  Fund of knowledge:    WNL  Insight:     good  Judgment:    good  Impulse Control:   good   Reported Symptoms:  Decreased sleep intermittently, some anxiety and irritability, ruminations  Risk Assessment: Danger to Self:  No Self-injurious Behavior: No Danger to Others: No Duty to Warn:no Physical Aggression / Violence:No  Access to Firearms a concern: No  Gang Involvement:No  Patient / guardian was educated about steps to take if suicide or homicide risk level increases between visits: yes While future psychiatric events cannot be accurately predicted, the patient does not currently require acute inpatient psychiatric care and does not currently meet [facility] involuntary commitment criteria.  Medications: Current Outpatient Medications  Medication Sig Dispense Refill   b complex vitamins tablet Take 1 tablet by mouth daily. 100 tablet 3   celecoxib (CELEBREX) 200 MG capsule Take 1 capsule (200 mg total) by mouth 2 (two) times daily. Follow-up appt due in must see provider for future refills 60 capsule 0   Cholecalciferol (VITAMIN D) 2000 units CAPS 1 daily 100 capsule 3   clonazePAM (KLONOPIN) 0.5 MG tablet TAKE ONE TABLET BY MOUTH THREE TIMES DAILY AS NEEDED FOR ANXIETY. (Patient taking differently: 2 daily) 60 tablet 2   divalproex (DEPAKOTE) 500 MG DR tablet Take 5 tablets (2,500 mg total) by mouth at bedtime. 450 tablet 1   esomeprazole (NEXIUM) 40  MG capsule Take 1 capsule (40 mg total) by mouth daily before breakfast. 90 capsule 4   HUMIRA PEN 40 MG/0.4ML PNKT Inject 40 mg into the muscle every 14 (fourteen) days.   1   hydrocortisone 2.5 % ointment Apply topically 2 (two) times daily. 60 g 3   lamoTRIgine (LAMICTAL) 200 MG tablet TAKE ONE TABLET BY MOUTH DAILY 60 tablet 0   methylphenidate (RITALIN) 10 MG tablet TAKE 1 TABLET BY MOUTH 3 TIMES A DAY WITH MEALS 270 tablet 0   methylphenidate 36 MG PO CR tablet Take 1 tablet (36 mg total) by mouth daily. 90 tablet 0   SYRINGE-NEEDLE, DISP, 3 ML (EASY TOUCH SAFETY SYRINGE) 22G X 1" 3 ML MISC As directed for IM injections 50 each 3   Testosterone Enanthate 200 MG/ML SOLN Inject 200 mg into the muscle once a week. 10 mL 3   No current facility-administered medications for this visit.    Allergies  Allergen Reactions   Nsaids Other (See Comments)    Constipation   Subjective:  Patient presents for today's session.  Assessed progress.  He shared how he had a recent tense exchange with his mother related to their having a disagreement regarding a day-to-day, logistical issue.  He went on to share again her chronic patterns of behavior that he feels reflect her tendency to be self-focused and noncompromising; he gave further details related to how this affects relationships examples  with which both he and his father have to cope at times.  Recently, he stated that he was able to receives a small amount of money so he could continue to buy supplies to work on the home remodeling project.  He stated that if his mother learned of this, she would want detailed itemized receipts and justification for the purchases.  Although patient states he understands the need to have a record, he feels her actions can be excessive.  Facilitated how he is continues to cope with this relational stress, ways to reframe some thoughts associated were explored in session.    Interventions: supportive therapy,  CBT  Diagnoses:    ICD-10-CM   1. Bipolar affective disorder, rapid cycling ([place])  F31.9              Plan: Patient is to use CBT, mindfulness and coping skills to help manage decrease symptoms associated with their diagnosis.   Continue to take steps toward effective communication in his relationship with is parents to keep stress low.     Long-term goal:   Reduce overall level, frequency, and intensity of the feelings of depression, anxiety evidenced by  decreased irritability, negative self talk, and improved functioning up to 80% of the time as reported by patient for at least 3 consecutive months.    Short-term goal:  Verbally express understanding of the relationship between feelings of depression, anxiety and their impact on thinking patterns and behaviors. Verbalize an understanding of the role that distorted thinking plays in creating fears, excessive worry, and ruminations. 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 Patient to identify and utilize effective communication skills with others       Patient to report any concerns with his medications to his prescribing provider as needed.  Progress: progressing   Anson [place], [facility]

## 2022-05-08 NOTE — Patient Instructions (Signed)
L-carnitine '1000mg'$  twice daily if needed

## 2022-05-13 DIAGNOSIS — M9902 Segmental and somatic dysfunction of thoracic region: Secondary | ICD-10-CM | POA: Diagnosis not present

## 2022-05-13 DIAGNOSIS — M9903 Segmental and somatic dysfunction of lumbar region: Secondary | ICD-10-CM | POA: Diagnosis not present

## 2022-05-13 DIAGNOSIS — M9905 Segmental and somatic dysfunction of pelvic region: Secondary | ICD-10-CM | POA: Diagnosis not present

## 2022-05-13 DIAGNOSIS — M9907 Segmental and somatic dysfunction of upper extremity: Secondary | ICD-10-CM | POA: Diagnosis not present

## 2022-05-16 DIAGNOSIS — M9905 Segmental and somatic dysfunction of pelvic region: Secondary | ICD-10-CM | POA: Diagnosis not present

## 2022-05-16 DIAGNOSIS — M9903 Segmental and somatic dysfunction of lumbar region: Secondary | ICD-10-CM | POA: Diagnosis not present

## 2022-05-16 DIAGNOSIS — M9907 Segmental and somatic dysfunction of upper extremity: Secondary | ICD-10-CM | POA: Diagnosis not present

## 2022-05-16 DIAGNOSIS — M9902 Segmental and somatic dysfunction of thoracic region: Secondary | ICD-10-CM | POA: Diagnosis not present

## 2022-05-19 ENCOUNTER — Other Ambulatory Visit: Payer: Self-pay | Admitting: Psychiatry

## 2022-05-19 DIAGNOSIS — F310 Bipolar disorder, current episode hypomanic: Secondary | ICD-10-CM

## 2022-05-20 DIAGNOSIS — M9907 Segmental and somatic dysfunction of upper extremity: Secondary | ICD-10-CM | POA: Diagnosis not present

## 2022-05-20 DIAGNOSIS — M9905 Segmental and somatic dysfunction of pelvic region: Secondary | ICD-10-CM | POA: Diagnosis not present

## 2022-05-20 DIAGNOSIS — M9903 Segmental and somatic dysfunction of lumbar region: Secondary | ICD-10-CM | POA: Diagnosis not present

## 2022-05-20 DIAGNOSIS — M9902 Segmental and somatic dysfunction of thoracic region: Secondary | ICD-10-CM | POA: Diagnosis not present

## 2022-05-22 ENCOUNTER — Ambulatory Visit (INDEPENDENT_AMBULATORY_CARE_PROVIDER_SITE_OTHER): Payer: BC Managed Care – PPO | Admitting: Mental Health

## 2022-05-22 DIAGNOSIS — F319 Bipolar disorder, unspecified: Secondary | ICD-10-CM | POA: Diagnosis not present
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
## 2022-05-28 ENCOUNTER — Encounter: Payer: Self-pay | Admitting: Internal Medicine

## 2022-06-04 ENCOUNTER — Other Ambulatory Visit: Payer: Self-pay | Admitting: Internal Medicine

## 2022-06-24 ENCOUNTER — Ambulatory Visit: Payer: BC Managed Care – PPO | Admitting: Mental Health

## 2022-07-03 DIAGNOSIS — M9907 Segmental and somatic dysfunction of upper extremity: Secondary | ICD-10-CM | POA: Diagnosis not present

## 2022-07-03 DIAGNOSIS — M9905 Segmental and somatic dysfunction of pelvic region: Secondary | ICD-10-CM | POA: Diagnosis not present

## 2022-07-03 DIAGNOSIS — M9902 Segmental and somatic dysfunction of thoracic region: Secondary | ICD-10-CM | POA: Diagnosis not present

## 2022-07-03 DIAGNOSIS — M9903 Segmental and somatic dysfunction of lumbar region: Secondary | ICD-10-CM | POA: Diagnosis not present

## 2022-07-03 NOTE — Progress Notes (Signed)
No charge. 

## 2022-07-08 DIAGNOSIS — M9902 Segmental and somatic dysfunction of thoracic region: Secondary | ICD-10-CM | POA: Diagnosis not present

## 2022-07-08 DIAGNOSIS — M9907 Segmental and somatic dysfunction of upper extremity: Secondary | ICD-10-CM | POA: Diagnosis not present

## 2022-07-08 DIAGNOSIS — M9905 Segmental and somatic dysfunction of pelvic region: Secondary | ICD-10-CM | POA: Diagnosis not present

## 2022-07-08 DIAGNOSIS — M9903 Segmental and somatic dysfunction of lumbar region: Secondary | ICD-10-CM | POA: Diagnosis not present

## 2022-07-10 ENCOUNTER — Telehealth: Payer: Self-pay | Admitting: Psychiatry

## 2022-07-10 ENCOUNTER — Ambulatory Visit (INDEPENDENT_AMBULATORY_CARE_PROVIDER_SITE_OTHER): Payer: BC Managed Care – PPO | Admitting: Mental Health

## 2022-07-10 DIAGNOSIS — F319 Bipolar disorder, unspecified: Secondary | ICD-10-CM | POA: Diagnosis not present

## 2022-07-10 DIAGNOSIS — L4059 Other psoriatic arthropathy: Secondary | ICD-10-CM | POA: Diagnosis not present

## 2022-07-10 DIAGNOSIS — Z1589 Genetic susceptibility to other disease: Secondary | ICD-10-CM | POA: Diagnosis not present

## 2022-07-10 DIAGNOSIS — M1A09X0 Idiopathic chronic gout, multiple sites, without tophus (tophi): Secondary | ICD-10-CM | POA: Diagnosis not present

## 2022-07-10 DIAGNOSIS — L400 Psoriasis vulgaris: Secondary | ICD-10-CM | POA: Diagnosis not present

## 2022-07-10 DIAGNOSIS — M9907 Segmental and somatic dysfunction of upper extremity: Secondary | ICD-10-CM | POA: Diagnosis not present

## 2022-07-10 DIAGNOSIS — M9902 Segmental and somatic dysfunction of thoracic region: Secondary | ICD-10-CM | POA: Diagnosis not present

## 2022-07-10 DIAGNOSIS — M9903 Segmental and somatic dysfunction of lumbar region: Secondary | ICD-10-CM | POA: Diagnosis not present

## 2022-07-10 DIAGNOSIS — M9905 Segmental and somatic dysfunction of pelvic region: Secondary | ICD-10-CM | POA: Diagnosis not present

## 2022-07-10 NOTE — Telephone Encounter (Signed)
Patient lvm at 3:12 requesting refill for Concerta '36mg'$  and Ritalin '10mg'$ . States he confirmed both medications are "in inventory." Ph: Wailea Medina [place] Spaniel

## 2022-07-10 NOTE — Telephone Encounter (Signed)
Filled 9/20 for 90 days due 12/19

## 2022-07-10 NOTE — Progress Notes (Signed)
Psychotherapy Note  Name: Francisco Morgan Date:  07/10/22 MRN: [id number] DOB: 12/22/1975 PCP: [name], MD  Time spent: 54 minutes  Treatment: Individual therapy  Mental Status Exam: Patient Appearance:    casual  Behavior:   appropriate  Motor:   WNL  Speech/Language:     Clear, coherent  Affect:   Full range  Mood:   euthymic  Thought process:   normal  Thought content:     WNL  Sensory/Perceptual disturbances:     WNL  Orientation:   x4  Attention:   Good  Concentration:   Good  Memory:   WNL  Fund of knowledge:    WNL  Insight:     good  Judgment:    good  Impulse Control:   good   Reported Symptoms:  Decreased sleep intermittently, some anxiety and irritability, ruminations  Risk Assessment: Danger to Self:  No Self-injurious Behavior: No Danger to Others: No Duty to Warn:no Physical Aggression / Violence:No  Access to Firearms a concern: No  Gang Involvement:No  Patient / guardian was educated about steps to take if suicide or homicide risk level increases between visits: yes While future psychiatric events cannot be accurately predicted, the patient does not currently require acute inpatient psychiatric care and does not currently meet [facility] involuntary commitment criteria.  Medications: Current Outpatient Medications  Medication Sig Dispense Refill   b complex vitamins tablet Take 1 tablet by mouth daily. 100 tablet 3   celecoxib (CELEBREX) 200 MG capsule Take 1 capsule (200 mg total) by mouth 2 (two) times daily. Annual appt due in Jan must see provider for future refills 60 capsule 2   Cholecalciferol (VITAMIN D) 2000 units CAPS 1 daily 100 capsule 3   clonazePAM (KLONOPIN) 0.5 MG tablet TAKE ONE TABLET BY MOUTH THREE TIMES DAILY AS NEEDED FOR ANXIETY. (Patient taking differently: 2 daily) 60 tablet 2   divalproex (DEPAKOTE) 500 MG DR tablet Take 5 tablets (2,500 mg total) by mouth at bedtime. 450 tablet 1   esomeprazole (NEXIUM) 40  MG capsule Take 1 capsule (40 mg total) by mouth daily before breakfast. 90 capsule 4   HUMIRA PEN 40 MG/0.4ML PNKT Inject 40 mg into the muscle every 14 (fourteen) days.   1   hydrocortisone 2.5 % ointment Apply topically 2 (two) times daily. 60 g 3   lamoTRIgine (LAMICTAL) 200 MG tablet TAKE ONE TABLET BY MOUTH DAILY 90 tablet 0   methylphenidate (RITALIN) 10 MG tablet TAKE 1 TABLET BY MOUTH 3 TIMES A DAY WITH MEALS 270 tablet 0   methylphenidate 36 MG PO CR tablet Take 1 tablet (36 mg total) by mouth daily. 90 tablet 0   SYRINGE-NEEDLE, DISP, 3 ML (EASY TOUCH SAFETY SYRINGE) 22G X 1" 3 ML MISC As directed for IM injections 50 each 3   Testosterone Enanthate 200 MG/ML SOLN Inject 200 mg into the muscle once a week. 10 mL 3   No current facility-administered medications for this visit.    Allergies  Allergen Reactions   Nsaids Other (See Comments)    Constipation   Subjective:  Patient presents for today's session.  Assess progress since last visit.  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  He stated that he had a contract and works for the past 2 to 3 months which eventually closed.  He went on to share details in the process, which ultimately led him to the decision to discontinue  wanting to continue this career.  Up to this point, he had been limited in his capacity in real estate, primarily focusing on the property motivation for his parents.  Assess his relationship with his parents, some ongoing challenges in communication with his mother at times.  Explored collaboratively ways to communicate effectively during disagreements, ways he is attempted to set boundaries.  Facilitated his identifying his focus in terms of employment where he plans to continue to focus on the property renovation going forward at this point.  Interventions: supportive therapy, motivational interviewing  Diagnoses:    ICD-10-CM   1. Bipolar affective disorder,  rapid cycling ([place])  F31.9             Plan: Patient is to use CBT, mindfulness and coping skills to help manage decrease symptoms associated with their diagnosis.   Continue to take steps toward effective communication in his relationship with is parents to keep stress low.     Long-term goal:   Reduce overall level, frequency, and intensity of the feelings of depression, anxiety evidenced by  decreased irritability, negative self talk, and improved functioning up to 80% of the time as reported by patient for at least 3 consecutive months.    Short-term goal:  Verbally express understanding of the relationship between feelings of depression, anxiety and their impact on thinking patterns and behaviors. Verbalize an understanding of the role that distorted thinking plays in creating fears, excessive worry, and ruminations. 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 Patient to identify and utilize effective communication skills with others       Patient to report any concerns with his medications to his prescribing provider as needed.  Progress: progressing   Anson [place], [facility]

## 2022-07-14 NOTE — Telephone Encounter (Signed)
Pt called back at 3:10p.  He wants the generic Concerta '36mg'$ s sent to [facility].  They have it in stock there.  The generic Ritalin was in stock at [facility] on Thurs, but I advised him, it wasn't due until tomorrow.  No upcoming appts scheduled.

## 2022-07-14 NOTE — Telephone Encounter (Signed)
Both doses fill 9/20 for #90 days.

## 2022-07-15 ENCOUNTER — Telehealth: Payer: Self-pay

## 2022-07-15 ENCOUNTER — Other Ambulatory Visit: Payer: Self-pay

## 2022-07-15 DIAGNOSIS — F902 Attention-deficit hyperactivity disorder, combined type: Secondary | ICD-10-CM

## 2022-07-15 MED ORDER — METHYLPHENIDATE HCL 10 MG PO TABS: ORAL_TABLET | ORAL | 0 refills | Status: DC

## 2022-07-15 MED ORDER — METHYLPHENIDATE HCL ER (OSM) 36 MG PO TBCR: 36.0000 mg | EXTENDED_RELEASE_TABLET | Freq: Every day | ORAL | 0 refills | Status: DC

## 2022-07-15 NOTE — Telephone Encounter (Signed)
Pt stopped by the office today requesting to see the nurse then the nurse practitioner, Lesle Chris, NP.  He was clearly upset and hyperverbal about an issue with his refills. I wasn't able to speak to him until he had to wait a little bit but he was reporting the issue of trying to get his Rx filled and was told the NP wouldn't send it in to his pharmacy until the due date. Informed him I was not aware of any issue but I reassured him that Lesle Chris, NP did not have anything to do with his refill request and they were all signed by Dr. Clovis Pu. He was upset when he arrived and wanted to report this issue. After speaking with him, he apologized for the accusation to Aaron Edelman about his refills. He felt misinformed about the situation but understands now.  Pt's last refill was on 04/16/22 for Methylphenidate 36 mg #90 and Methylphenidate 10 mg #270. He normally gets a 90 day supply which is hard to find at the pharmacies now. He reported the pharmacies he found them at had them available but now neither one had them at this time. Pt is going to call around today and try to find at least a 30 day supply of medication.  Pt was very appreciative of the time I took to talk to him and figure out things. He was given my # to contact me when he was able to find both RX's so they could be submitted to his pharmacy.   Dr. Clovis Pu is aware of situation. Pt was frustrated about how hard it is to find any controlled stimulant especially a 90 day supply and finding availability worsened things.

## 2022-07-15 NOTE — Telephone Encounter (Signed)
Pended.

## 2022-07-17 ENCOUNTER — Telehealth: Payer: Self-pay | Admitting: Psychiatry

## 2022-07-17 NOTE — Telephone Encounter (Signed)
Pt left message about med refills [facility] will not have meds in hand until 1/2. That's not going to work. Pt out. Please contact  Pt @ [phone]

## 2022-07-18 ENCOUNTER — Other Ambulatory Visit: Payer: Self-pay

## 2022-07-18 DIAGNOSIS — F902 Attention-deficit hyperactivity disorder, combined type: Secondary | ICD-10-CM

## 2022-07-18 MED ORDER — METHYLPHENIDATE HCL 10 MG PO TABS: ORAL_TABLET | ORAL | 0 refills | Status: DC

## 2022-07-18 NOTE — Telephone Encounter (Signed)
The pharmacy that Dr. Clovis Pu sent to has enough 36 mg to fill 90-day, only has enough of 10 mg for 30-day but would have to combine 2 different manufacturers.

## 2022-07-22 DIAGNOSIS — M9905 Segmental and somatic dysfunction of pelvic region: Secondary | ICD-10-CM | POA: Diagnosis not present

## 2022-07-22 DIAGNOSIS — M9902 Segmental and somatic dysfunction of thoracic region: Secondary | ICD-10-CM | POA: Diagnosis not present

## 2022-07-22 DIAGNOSIS — M9907 Segmental and somatic dysfunction of upper extremity: Secondary | ICD-10-CM | POA: Diagnosis not present

## 2022-07-22 DIAGNOSIS — M9903 Segmental and somatic dysfunction of lumbar region: Secondary | ICD-10-CM | POA: Diagnosis not present

## 2022-07-23 DIAGNOSIS — M9905 Segmental and somatic dysfunction of pelvic region: Secondary | ICD-10-CM | POA: Diagnosis not present

## 2022-07-23 DIAGNOSIS — M9902 Segmental and somatic dysfunction of thoracic region: Secondary | ICD-10-CM | POA: Diagnosis not present

## 2022-07-23 DIAGNOSIS — M9903 Segmental and somatic dysfunction of lumbar region: Secondary | ICD-10-CM | POA: Diagnosis not present

## 2022-07-23 DIAGNOSIS — M9907 Segmental and somatic dysfunction of upper extremity: Secondary | ICD-10-CM | POA: Diagnosis not present

## 2022-07-24 ENCOUNTER — Ambulatory Visit (INDEPENDENT_AMBULATORY_CARE_PROVIDER_SITE_OTHER): Payer: BC Managed Care – PPO | Admitting: Mental Health

## 2022-07-24 DIAGNOSIS — M9905 Segmental and somatic dysfunction of pelvic region: Secondary | ICD-10-CM | POA: Diagnosis not present

## 2022-07-24 DIAGNOSIS — M9902 Segmental and somatic dysfunction of thoracic region: Secondary | ICD-10-CM | POA: Diagnosis not present

## 2022-07-24 DIAGNOSIS — M9903 Segmental and somatic dysfunction of lumbar region: Secondary | ICD-10-CM | POA: Diagnosis not present

## 2022-07-24 DIAGNOSIS — M9907 Segmental and somatic dysfunction of upper extremity: Secondary | ICD-10-CM | POA: Diagnosis not present

## 2022-07-24 DIAGNOSIS — F319 Bipolar disorder, unspecified: Secondary | ICD-10-CM

## 2022-07-24 NOTE — Progress Notes (Signed)
Psychotherapy Note  Name: Francisco Morgan Date:  07/24/22 MRN: [id number] DOB: 1976/04/17 PCP: [name], MD  Time spent: 53 minutes  Treatment: Individual therapy  Mental Status Exam: Patient Appearance:    casual  Behavior:   appropriate  Motor:   WNL  Speech/Language:     Clear, coherent  Affect:   Full range  Mood:   euthymic  Thought process:   normal  Thought content:     WNL  Sensory/Perceptual disturbances:     WNL  Orientation:   x4  Attention:   Good  Concentration:   Good  Memory:   WNL  Fund of knowledge:    WNL  Insight:     good  Judgment:    good  Impulse Control:   good   Reported Symptoms:  Decreased sleep intermittently, some anxiety and irritability, ruminations  Risk Assessment: Danger to Self:  No Self-injurious Behavior: No Danger to Others: No Duty to Warn:no Physical Aggression / Violence:No  Access to Firearms a concern: No  Gang Involvement:No  Patient / guardian was educated about steps to take if suicide or homicide risk level increases between visits: yes While future psychiatric events cannot be accurately predicted, the patient does not currently require acute inpatient psychiatric care and does not currently meet [facility] involuntary commitment criteria.  Medications: Current Outpatient Medications  Medication Sig Dispense Refill   b complex vitamins tablet Take 1 tablet by mouth daily. 100 tablet 3   celecoxib (CELEBREX) 200 MG capsule Take 1 capsule (200 mg total) by mouth 2 (two) times daily. Annual appt due in Jan must see provider for future refills 60 capsule 2   Cholecalciferol (VITAMIN D) 2000 units CAPS 1 daily 100 capsule 3   clonazePAM (KLONOPIN) 0.5 MG tablet TAKE ONE TABLET BY MOUTH THREE TIMES DAILY AS NEEDED FOR ANXIETY. (Patient taking differently: 2 daily) 60 tablet 2   divalproex (DEPAKOTE) 500 MG DR tablet Take 5 tablets (2,500 mg total) by mouth at bedtime. 450 tablet 1   esomeprazole (NEXIUM) 40  MG capsule Take 1 capsule (40 mg total) by mouth daily before breakfast. 90 capsule 4   HUMIRA PEN 40 MG/0.4ML PNKT Inject 40 mg into the muscle every 14 (fourteen) days.   1   hydrocortisone 2.5 % ointment Apply topically 2 (two) times daily. 60 g 3   lamoTRIgine (LAMICTAL) 200 MG tablet TAKE ONE TABLET BY MOUTH DAILY 90 tablet 0   methylphenidate (RITALIN) 10 MG tablet TAKE 1 TABLET BY MOUTH 3 TIMES A DAY WITH MEALS 270 tablet 0   methylphenidate 36 MG PO CR tablet Take 1 tablet (36 mg total) by mouth daily. 90 tablet 0   SYRINGE-NEEDLE, DISP, 3 ML (EASY TOUCH SAFETY SYRINGE) 22G X 1" 3 ML MISC As directed for IM injections 50 each 3   Testosterone Enanthate 200 MG/ML SOLN Inject 200 mg into the muscle once a week. 10 mL 3   No current facility-administered medications for this visit.    Allergies  Allergen Reactions   Nsaids Other (See Comments)    Constipation   Subjective:  Patient presents for today's session.  Assessed progress.  Patient shared how he and his mother in arguments recently over Christmas.  He stated that his father was supportive of him, as they discussed potential gifts to give 1 another.  He stated that the argument with his mother consisted of her passing judgment on him for wanting to go to a concert, which is  one of his main outlets for enjoyment.  He stated the argument escalated as she was raising her voice to emphasize her points throughout, it is ultimately leaving.  He stated that tempers called in a day or 2 later, however, that his mother will not admit any mistakes on her part, does not say "I am sorry". 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  He stated he based this decision off most recent transaction which was highly stressful.   Interventions: supportive therapy, motivational interviewing  Diagnoses:    ICD-10-CM   1. Bipolar affective disorder, rapid cycling ([place])  F31.9         Plan: Patient is to use CBT, mindfulness and coping skills to help manage decrease symptoms associated with their diagnosis.   Continue to take steps toward effective communication in his relationship with is parents to keep stress low.     Long-term goal:   Reduce overall level, frequency, and intensity of the feelings of depression, anxiety evidenced by  decreased irritability, negative self talk, and improved functioning up to 80% of the time as reported by patient for at least 3 consecutive months.    Short-term goal:  Verbally express understanding of the relationship between feelings of depression, anxiety and their impact on thinking patterns and behaviors. Verbalize an understanding of the role that distorted thinking plays in creating fears, excessive worry, and ruminations. 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 Patient to identify and utilize effective communication skills with others       Patient to report any concerns with his medications to his prescribing provider as needed.  Progress: progressing   Anson [place], [facility]

## 2022-08-07 ENCOUNTER — Ambulatory Visit (INDEPENDENT_AMBULATORY_CARE_PROVIDER_SITE_OTHER): Payer: BC Managed Care – PPO | Admitting: Mental Health

## 2022-08-07 DIAGNOSIS — F319 Bipolar disorder, unspecified: Secondary | ICD-10-CM
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
## 2022-08-20 ENCOUNTER — Other Ambulatory Visit: Payer: Self-pay | Admitting: Psychiatry

## 2022-08-20 DIAGNOSIS — F310 Bipolar disorder, current episode hypomanic: Secondary | ICD-10-CM

## 2022-08-21 ENCOUNTER — Ambulatory Visit: Payer: BC Managed Care – PPO | Admitting: Mental Health

## 2022-09-02 ENCOUNTER — Ambulatory Visit: Payer: BC Managed Care – PPO | Admitting: Mental Health

## 2022-09-04 ENCOUNTER — Ambulatory Visit (INDEPENDENT_AMBULATORY_CARE_PROVIDER_SITE_OTHER): Payer: BC Managed Care – PPO | Admitting: Mental Health

## 2022-09-04 DIAGNOSIS — M9907 Segmental and somatic dysfunction of upper extremity: Secondary | ICD-10-CM | POA: Diagnosis not present

## 2022-09-04 DIAGNOSIS — M9905 Segmental and somatic dysfunction of pelvic region: Secondary | ICD-10-CM | POA: Diagnosis not present

## 2022-09-04 DIAGNOSIS — F319 Bipolar disorder, unspecified: Secondary | ICD-10-CM | POA: Diagnosis not present

## 2022-09-04 DIAGNOSIS — M9903 Segmental and somatic dysfunction of lumbar region: Secondary | ICD-10-CM | POA: Diagnosis not present

## 2022-09-04 DIAGNOSIS — M9902 Segmental and somatic dysfunction of thoracic region: Secondary | ICD-10-CM | POA: Diagnosis not present

## 2022-09-04 NOTE — Progress Notes (Signed)
Psychotherapy Note  Name: Francisco Morgan Date:  09/04/22 MRN: [id number] DOB: 1976-03-30 PCP: [name], MD  Time spent: 54 minutes  Treatment: Individual therapy  Mental Status Exam: Patient Appearance:    casual  Behavior:   appropriate  Motor:   WNL  Speech/Language:     Clear, coherent  Affect:   Full range  Mood:   euthymic  Thought process:   normal  Thought content:     WNL  Sensory/Perceptual disturbances:     WNL  Orientation:   x4  Attention:   Good  Concentration:   Good  Memory:   WNL  Fund of knowledge:    WNL  Insight:     good  Judgment:    good  Impulse Control:   good   Reported Symptoms:  Decreased sleep intermittently, some anxiety and irritability, ruminations  Risk Assessment: Danger to Self:  No Self-injurious Behavior: No Danger to Others: No Duty to Warn:no Physical Aggression / Violence:No  Access to Firearms a concern: No  Gang Involvement:No  Patient / guardian was educated about steps to take if suicide or homicide risk level increases between visits: yes While future psychiatric events cannot be accurately predicted, the patient does not currently require acute inpatient psychiatric care and does not currently meet [facility] involuntary commitment criteria.  Medications: Current Outpatient Medications  Medication Sig Dispense Refill   b complex vitamins tablet Take 1 tablet by mouth daily. 100 tablet 3   celecoxib (CELEBREX) 200 MG capsule Take 1 capsule (200 mg total) by mouth 2 (two) times daily. Annual appt due in Jan must see provider for future refills 60 capsule 2   Cholecalciferol (VITAMIN D) 2000 units CAPS 1 daily 100 capsule 3   clonazePAM (KLONOPIN) 0.5 MG tablet TAKE ONE TABLET BY MOUTH THREE TIMES DAILY AS NEEDED FOR ANXIETY. (Patient taking differently: 2 daily) 60 tablet 2   divalproex (DEPAKOTE) 500 MG DR tablet Take 5 tablets (2,500 mg total) by mouth at bedtime. 450 tablet 1   esomeprazole (NEXIUM) 40  MG capsule Take 1 capsule (40 mg total) by mouth daily before breakfast. 90 capsule 4   HUMIRA PEN 40 MG/0.4ML PNKT Inject 40 mg into the muscle every 14 (fourteen) days.   1   hydrocortisone 2.5 % ointment Apply topically 2 (two) times daily. 60 g 3   lamoTRIgine (LAMICTAL) 200 MG tablet TAKE ONE TABLET BY MOUTH DAILY 90 tablet 0   methylphenidate (RITALIN) 10 MG tablet TAKE 1 TABLET BY MOUTH 3 TIMES A DAY WITH MEALS 270 tablet 0   methylphenidate 36 MG PO CR tablet Take 1 tablet (36 mg total) by mouth daily. 90 tablet 0   SYRINGE-NEEDLE, DISP, 3 ML (EASY TOUCH SAFETY SYRINGE) 22G X 1" 3 ML MISC As directed for IM injections 50 each 3   Testosterone Enanthate 200 MG/ML SOLN Inject 200 mg into the muscle once a week. 10 mL 3   No current facility-administered medications for this visit.    Allergies  Allergen Reactions   Nsaids Other (See Comments)    Constipation   Subjective:  Patient presents for today's session.  Assessed progress in recent events.  Patient shared how his parents got back from vacation recently, how they had a discussion about the renovation property and how they want to list it in April.  Patient does not fully agree with his parents timeframe but submits to their making this decision.  He went on to share the relational  dynamic he continues to have with his mother.  He continues to identify not feeling fully heard by his mother for various reasons.  He gave more perspective related to their relationship in terms of expressions of sentiments through physical touch where he stated that he hugged around Christmas at a family event, this being where and how she recently seemed to reciprocate this behavior.  He shared how she inquired if he was feeling lonely in his life where he went on to identify how this is not the case, given his personality type, giving examples. Patient was more distracted today than typical but was able to be refocused toward identifying recent stressors  and processing thoughts and feelings related.  Diagnoses:    ICD-10-CM   1. Bipolar affective disorder, rapid cycling ([place])  F31.9          Plan: Patient is to use CBT, mindfulness and coping skills to help manage / decrease symptoms associated with their diagnosis.   Continue to take steps toward effective communication in his relationship with is parents to keep stress low.     Long-term goal:   Reduce overall level, frequency, and intensity of the feelings of depression, anxiety evidenced by  decreased irritability, negative self talk, and improved functioning up to 80% of the time as reported by patient for at least 3 consecutive months.    Short-term goal:  Verbally express understanding of the relationship between feelings of depression, anxiety and their impact on thinking patterns and behaviors. Verbalize an understanding of the role that distorted thinking plays in creating fears, excessive worry, and ruminations. 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 Patient to identify and utilize effective communication skills with others       Patient to report any concerns with his medications to his prescribing provider as needed.  Progress: progressing   Anson [place], [facility]

## 2022-09-11 DIAGNOSIS — M9902 Segmental and somatic dysfunction of thoracic region: Secondary | ICD-10-CM | POA: Diagnosis not present

## 2022-09-11 DIAGNOSIS — M9907 Segmental and somatic dysfunction of upper extremity: Secondary | ICD-10-CM | POA: Diagnosis not present

## 2022-09-11 DIAGNOSIS — M9905 Segmental and somatic dysfunction of pelvic region: Secondary | ICD-10-CM | POA: Diagnosis not present

## 2022-09-11 DIAGNOSIS — M9903 Segmental and somatic dysfunction of lumbar region: Secondary | ICD-10-CM | POA: Diagnosis not present

## 2022-09-18 ENCOUNTER — Ambulatory Visit (INDEPENDENT_AMBULATORY_CARE_PROVIDER_SITE_OTHER): Payer: BC Managed Care – PPO | Admitting: Mental Health

## 2022-09-18 DIAGNOSIS — F319 Bipolar disorder, unspecified: Secondary | ICD-10-CM | POA: Diagnosis not present

## 2022-09-18 NOTE — Progress Notes (Signed)
Psychotherapy Note  Name: Francisco Morgan Date:  09/18/22 MRN: [id number] DOB: Oct 12, 1975 PCP: [name], MD  Time spent: 51 minutes  Treatment: Individual therapy  Mental Status Exam: Patient Appearance:    casual  Behavior:   appropriate  Motor:   WNL  Speech/Language:     Clear, coherent  Affect:   Full range  Mood:   euthymic  Thought process:   normal  Thought content:     WNL  Sensory/Perceptual disturbances:     WNL  Orientation:   x4  Attention:   Good  Concentration:   Good  Memory:   WNL  Fund of knowledge:    WNL  Insight:     good  Judgment:    good  Impulse Control:   good   Reported Symptoms:  Decreased sleep intermittently, some anxiety and irritability, ruminations  Risk Assessment: Danger to Self:  No Self-injurious Behavior: No Danger to Others: No Duty to Warn:no Physical Aggression / Violence:No  Access to Firearms a concern: No  Gang Involvement:No  Patient / guardian was educated about steps to take if suicide or homicide risk level increases between visits: yes While future psychiatric events cannot be accurately predicted, the patient does not currently require acute inpatient psychiatric care and does not currently meet [facility] involuntary commitment criteria.  Medications: Current Outpatient Medications  Medication Sig Dispense Refill   b complex vitamins tablet Take 1 tablet by mouth daily. 100 tablet 3   celecoxib (CELEBREX) 200 MG capsule Take 1 capsule (200 mg total) by mouth 2 (two) times daily. Annual appt due in Jan must see provider for future refills 60 capsule 2   Cholecalciferol (VITAMIN D) 2000 units CAPS 1 daily 100 capsule 3   clonazePAM (KLONOPIN) 0.5 MG tablet TAKE ONE TABLET BY MOUTH THREE TIMES DAILY AS NEEDED FOR ANXIETY. (Patient taking differently: 2 daily) 60 tablet 2   divalproex (DEPAKOTE) 500 MG DR tablet Take 5 tablets (2,500 mg total) by mouth at bedtime. 450 tablet 1   esomeprazole (NEXIUM) 40  MG capsule Take 1 capsule (40 mg total) by mouth daily before breakfast. 90 capsule 4   HUMIRA PEN 40 MG/0.4ML PNKT Inject 40 mg into the muscle every 14 (fourteen) days.   1   hydrocortisone 2.5 % ointment Apply topically 2 (two) times daily. 60 g 3   lamoTRIgine (LAMICTAL) 200 MG tablet TAKE ONE TABLET BY MOUTH DAILY 90 tablet 0   methylphenidate (RITALIN) 10 MG tablet TAKE 1 TABLET BY MOUTH 3 TIMES A DAY WITH MEALS 270 tablet 0   methylphenidate 36 MG PO CR tablet Take 1 tablet (36 mg total) by mouth daily. 90 tablet 0   SYRINGE-NEEDLE, DISP, 3 ML (EASY TOUCH SAFETY SYRINGE) 22G X 1" 3 ML MISC As directed for IM injections 50 each 3   Testosterone Enanthate 200 MG/ML SOLN Inject 200 mg into the muscle once a week. 10 mL 3   No current facility-administered medications for this visit.    Allergies  Allergen Reactions   Nsaids Other (See Comments)    Constipation   Subjective:  Patient presents for today's session.  Assessed progress in recent events.  Patient shared ongoing stress in the relationship with his parents primarily centered around his completing the home renovation.  He stated that his dad is planning to come by the property today to assess progress.  Patient stated that he has struggled over the last few days with motivation to work on the  property.  Reports he continues to make progress intermittently, where he may work for a few days consecutively and then off for a few days.  He expressed motivation to show the considerable work he has accomplished to his father later today when they meet.  Continue to explore the relational dynamic, ways to communicate effectively with his father today was identified as a need and explored collaboratively in session.  Interventions: Motivational interviewing, supportive therapy  Diagnoses:    ICD-10-CM   1. Bipolar affective disorder, rapid cycling ([place])  F31.9           Plan: Patient is to use CBT, mindfulness and coping skills to  help manage / decrease symptoms associated with their diagnosis.   Continue to take steps toward effective communication in his relationship with is parents to keep stress low.     Long-term goal:   Reduce overall level, frequency, and intensity of the feelings of depression, anxiety evidenced by  decreased irritability, negative self talk, and improved functioning up to 80% of the time as reported by patient for at least 3 consecutive months.    Short-term goal:  Verbally express understanding of the relationship between feelings of depression, anxiety and their impact on thinking patterns and behaviors. Verbalize an understanding of the role that distorted thinking plays in creating fears, excessive worry, and ruminations. 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 Patient to identify and utilize effective communication skills with others       Patient to report any concerns with his medications to his prescribing provider as needed.  Progress: progressing   Anson [place], [facility]

## 2022-10-02 ENCOUNTER — Ambulatory Visit (INDEPENDENT_AMBULATORY_CARE_PROVIDER_SITE_OTHER): Payer: BC Managed Care – PPO | Admitting: Mental Health

## 2022-10-02 DIAGNOSIS — F319 Bipolar disorder, unspecified: Secondary | ICD-10-CM

## 2022-10-02 NOTE — Progress Notes (Signed)
Psychotherapy Note  Name: ADAN DEWAARD Date:  10/02/22 MRN: [id number] DOB: [birth year] PCP: [name], MD  Time spent: 50 minutes  Treatment: Individual therapy  Mental Status Exam: Patient Appearance:    casual  Behavior:   appropriate  Motor:   WNL  Speech/Language:     Clear, coherent  Affect:   Full range  Mood:   euthymic  Thought process:   normal  Thought content:     WNL  Sensory/Perceptual disturbances:     WNL  Orientation:   x4  Attention:   Good  Concentration:   Good  Memory:   WNL  Fund of knowledge:    WNL  Insight:     good  Judgment:    good  Impulse Control:   good   Reported Symptoms:  Decreased sleep intermittently, some anxiety and irritability, ruminations  Risk Assessment: Danger to Self:  No Self-injurious Behavior: No Danger to Others: No Duty to Warn:no Physical Aggression / Violence:No  Access to Firearms a concern: No  Gang Involvement:No  Patient / guardian was educated about steps to take if suicide or homicide risk level increases between visits: yes While future psychiatric events cannot be accurately predicted, the patient does not currently require acute inpatient psychiatric care and does not currently meet [facility] involuntary commitment criteria.  Medications: Current Outpatient Medications  Medication Sig Dispense Refill   b complex vitamins tablet Take 1 tablet by mouth daily. 100 tablet 3   celecoxib (CELEBREX) 200 MG capsule Take 1 capsule (200 mg total) by mouth 2 (two) times daily. Annual appt due in Jan must see provider for future refills 60 capsule 2   Cholecalciferol (VITAMIN D) 2000 units CAPS 1 daily 100 capsule 3   clonazePAM (KLONOPIN) 0.5 MG tablet TAKE ONE TABLET BY MOUTH THREE TIMES DAILY AS NEEDED FOR ANXIETY. (Patient taking differently: 2 daily) 60 tablet 2   divalproex (DEPAKOTE) 500 MG DR tablet Take 5 tablets (2,500 mg total) by mouth at bedtime. 450 tablet 1   esomeprazole (NEXIUM) 40  MG capsule Take 1 capsule (40 mg total) by mouth daily before breakfast. 90 capsule 4   HUMIRA PEN 40 MG/0.4ML PNKT Inject 40 mg into the muscle every 14 (fourteen) days.   1   hydrocortisone 2.5 % ointment Apply topically 2 (two) times daily. 60 g 3   lamoTRIgine (LAMICTAL) 200 MG tablet TAKE ONE TABLET BY MOUTH DAILY 90 tablet 0   methylphenidate (RITALIN) 10 MG tablet TAKE 1 TABLET BY MOUTH 3 TIMES A DAY WITH MEALS 270 tablet 0   methylphenidate 36 MG PO CR tablet Take 1 tablet (36 mg total) by mouth daily. 90 tablet 0   SYRINGE-NEEDLE, DISP, 3 ML (EASY TOUCH SAFETY SYRINGE) 22G X 1" 3 ML MISC As directed for IM injections 50 each 3   Testosterone Enanthate 200 MG/ML SOLN Inject 200 mg into the muscle once a week. 10 mL 3   No current facility-administered medications for this visit.    Allergies  Allergen Reactions   Nsaids Other (See Comments)    Constipation   Subjective:  Patient presents for today's session.  Assessed progress.  He shared ongoing challenges family related to completing the home remodeling project he continues to work on.  Reviewed how he learned last session that his father was going to the property later that day where patient went on to share the exchanges they had which he stated was conflictual.  He stated his father raised  his voice several times being critical of a lack of progress patient was making on the property.  Patient stated he went on to share with him specifics of what he had gone down over the last couple of months.  Explored if he feels he handled the situation effectively where patient stated that he feels like he did due to his not being managing his father's energy, getting upset excetra.  Patient stated he stayed calm and matter of fact.  Through guided discovery, he identified wanting to continue to handle these types of situations with his parents the same way, albeit he knows that they are often on the same page with progress largely due to his  stating their lack of knowledge with construction in which she has considerable.  Facilitated his identifying ways to continue to be consistent with his sleep patterns which impact his ability to make the progress he feels he needs to with the property to ultimately get on the market.  Interventions: Motivational interviewing, problem solving  Diagnoses:    ICD-10-CM   1. Bipolar affective disorder, rapid cycling ([place])  F31.9            Plan: Patient is to use CBT, mindfulness and coping skills to help manage / decrease symptoms associated with their diagnosis.   Continue to take steps toward effective communication in his relationship with is parents to keep stress low.     Long-term goal:   Reduce overall level, frequency, and intensity of the feelings of depression, anxiety for at least 3 consecutive months per patient.   Short-term goal:  Verbally express understanding of the relationship between feelings of depression, anxiety and their impact on thinking patterns and behaviors. Verbalize an understanding of the role that distorted thinking plays in creating fears, excessive worry, and ruminations. 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 Patient to identify and utilize effective communication skills with others       Patient to report any concerns with his medications to his prescribing provider as needed.  Progress: progressing   Anson [place], [facility]

## 2022-10-09 NOTE — Progress Notes (Signed)
This encounter was created in error - please disregard.

## 2022-10-14 ENCOUNTER — Telehealth: Payer: Self-pay | Admitting: Psychiatry

## 2022-10-14 NOTE — Telephone Encounter (Signed)
Patient lvm today at 3:25 requesting refills on the Concerta and the Ritalin. Send to the [facility] on [address]. A 30 day supply for each is requested, and available per patient.  Last seen 05/08/22, no follow up scheduled

## 2022-10-15 ENCOUNTER — Other Ambulatory Visit: Payer: Self-pay | Admitting: Psychiatry

## 2022-10-15 DIAGNOSIS — F902 Attention-deficit hyperactivity disorder, combined type: Secondary | ICD-10-CM

## 2022-10-15 MED ORDER — METHYLPHENIDATE HCL 10 MG PO TABS: ORAL_TABLET | ORAL | 0 refills | Status: DC

## 2022-10-15 MED ORDER — METHYLPHENIDATE HCL ER (OSM) 36 MG PO TBCR: 36.0000 mg | EXTENDED_RELEASE_TABLET | Freq: Every day | ORAL | 0 refills | Status: DC

## 2022-10-16 ENCOUNTER — Ambulatory Visit (INDEPENDENT_AMBULATORY_CARE_PROVIDER_SITE_OTHER): Payer: BC Managed Care – PPO | Admitting: Mental Health

## 2022-10-16 DIAGNOSIS — F319 Bipolar disorder, unspecified: Secondary | ICD-10-CM
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
## 2022-10-21 DIAGNOSIS — R2689 Other abnormalities of gait and mobility: Secondary | ICD-10-CM | POA: Diagnosis not present

## 2022-10-21 DIAGNOSIS — M21621 Bunionette of right foot: Secondary | ICD-10-CM | POA: Diagnosis not present

## 2022-11-06 ENCOUNTER — Ambulatory Visit (INDEPENDENT_AMBULATORY_CARE_PROVIDER_SITE_OTHER): Payer: BC Managed Care – PPO | Admitting: Orthopedic Surgery

## 2022-11-06 DIAGNOSIS — M25871 Other specified joint disorders, right ankle and foot: Secondary | ICD-10-CM | POA: Diagnosis not present

## 2022-11-06 DIAGNOSIS — M1A00X0 Idiopathic chronic gout, unspecified site, without tophus (tophi): Secondary | ICD-10-CM

## 2022-11-08 ENCOUNTER — Encounter: Payer: Self-pay | Admitting: Orthopedic Surgery

## 2022-11-08 DIAGNOSIS — M1A00X0 Idiopathic chronic gout, unspecified site, without tophus (tophi): Secondary | ICD-10-CM

## 2022-11-08 DIAGNOSIS — M25871 Other specified joint disorders, right ankle and foot: Secondary | ICD-10-CM

## 2022-11-08 MED ORDER — LIDOCAINE HCL 1 % IJ SOLN
2.0000 mL | INTRAMUSCULAR | Status: AC | PRN
Start: 2022-11-08 — End: 2022-11-08
  Administered 2022-11-08: 2 mL

## 2022-11-08 MED ORDER — METHYLPREDNISOLONE ACETATE 40 MG/ML IJ SUSP
40.0000 mg | INTRAMUSCULAR | Status: AC | PRN
Start: 2022-11-08 — End: 2022-11-08
  Administered 2022-11-08: 40 mg via INTRA_ARTICULAR
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
## 2022-11-10 ENCOUNTER — Ambulatory Visit: Payer: BC Managed Care – PPO | Admitting: Orthopedic Surgery

## 2022-11-21 ENCOUNTER — Other Ambulatory Visit: Payer: Self-pay | Admitting: Psychiatry

## 2022-11-21 ENCOUNTER — Other Ambulatory Visit: Payer: Self-pay | Admitting: Internal Medicine

## 2022-11-21 DIAGNOSIS — F319 Bipolar disorder, unspecified: Secondary | ICD-10-CM

## 2022-11-21 DIAGNOSIS — F4310 Post-traumatic stress disorder, unspecified: Secondary | ICD-10-CM

## 2022-11-21 NOTE — Telephone Encounter (Signed)
Please call to schedule an appt, was due in January.  

## 2022-11-27 ENCOUNTER — Ambulatory Visit: Payer: BC Managed Care – PPO | Admitting: Mental Health

## 2022-12-09 ENCOUNTER — Other Ambulatory Visit: Payer: Self-pay | Admitting: Psychiatry

## 2022-12-09 DIAGNOSIS — F310 Bipolar disorder, current episode hypomanic: Secondary | ICD-10-CM

## 2022-12-11 ENCOUNTER — Ambulatory Visit: Payer: BC Managed Care – PPO | Admitting: Mental Health

## 2022-12-11 NOTE — Progress Notes (Signed)
No charge for no show 12/11/22.  [name], [facility]

## 2022-12-19 NOTE — Telephone Encounter (Signed)
Scheduled 5/30  °

## 2022-12-25 ENCOUNTER — Ambulatory Visit (INDEPENDENT_AMBULATORY_CARE_PROVIDER_SITE_OTHER): Payer: BC Managed Care – PPO | Admitting: Mental Health

## 2022-12-25 DIAGNOSIS — F319 Bipolar disorder, unspecified: Secondary | ICD-10-CM
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
## 2023-01-01 DIAGNOSIS — Z111 Encounter for screening for respiratory tuberculosis: Secondary | ICD-10-CM | POA: Diagnosis not present

## 2023-01-01 DIAGNOSIS — Z1589 Genetic susceptibility to other disease: Secondary | ICD-10-CM | POA: Diagnosis not present

## 2023-01-01 DIAGNOSIS — L400 Psoriasis vulgaris: Secondary | ICD-10-CM | POA: Diagnosis not present

## 2023-01-01 DIAGNOSIS — M1A09X0 Idiopathic chronic gout, multiple sites, without tophus (tophi): Secondary | ICD-10-CM | POA: Diagnosis not present

## 2023-01-01 DIAGNOSIS — L4059 Other psoriatic arthropathy: Secondary | ICD-10-CM | POA: Diagnosis not present

## 2023-01-08 ENCOUNTER — Ambulatory Visit (INDEPENDENT_AMBULATORY_CARE_PROVIDER_SITE_OTHER): Payer: BC Managed Care – PPO | Admitting: Mental Health

## 2023-01-08 DIAGNOSIS — F319 Bipolar disorder, unspecified: Secondary | ICD-10-CM

## 2023-01-08 NOTE — Progress Notes (Signed)
Psychotherapy Note  Name: Francisco Morgan Date:  01/08/23 MRN: [id number] DOB: 12/15/1975 PCP: Tresa Garter, MD  Time in: 1:00pm Time out:  1:52pm  Treatment: Individual therapy  Mental Status Exam: Patient Appearance:    casual  Behavior:   appropriate  Motor:   WNL  Speech/Language:     Clear, coherent  Affect:   Full range  Mood:   euthymic  Thought process:   normal  Thought content:     WNL  Sensory/Perceptual disturbances:     WNL  Orientation:   x4  Attention:   Good  Concentration:   Good  Memory:   WNL  Fund of knowledge:    WNL  Insight:     good  Judgment:    good  Impulse Control:   good   Reported Symptoms:  Decreased sleep intermittently, some anxiety and irritability, ruminations  Risk Assessment: Danger to Self:  No Self-injurious Behavior: No Danger to Others: No Duty to Warn:no Physical Aggression / Violence:No  Access to Firearms a concern: No  Gang Involvement:No  Patient / guardian was educated about steps to take if suicide or homicide risk level increases between visits: yes While future psychiatric events cannot be accurately predicted, the patient does not currently require acute inpatient psychiatric care and does not currently meet [facility] involuntary commitment criteria.  Medications: Current Outpatient Medications  Medication Sig Dispense Refill   b complex vitamins tablet Take 1 tablet by mouth daily. 100 tablet 3   celecoxib (CELEBREX) 200 MG capsule Take 1 capsule (200 mg total) by mouth 2 (two) times daily. Annual appt due is due must see provider for future refills 60 capsule 0   Cholecalciferol (VITAMIN D) 2000 units CAPS 1 daily 100 capsule 3   clonazePAM (KLONOPIN) 0.5 MG tablet TAKE ONE TABLET BY MOUTH THREE TIMES DAILY AS NEEDED FOR ANXIETY. 60 tablet 2   divalproex (DEPAKOTE) 500 MG DR tablet Take 5 tablets (2,500 mg total) by mouth at bedtime. 450 tablet 0   esomeprazole (NEXIUM) 40 MG capsule Take 1 capsule  (40 mg total) by mouth daily before breakfast. 90 capsule 4   HUMIRA PEN 40 MG/0.4ML PNKT Inject 40 mg into the muscle every 14 (fourteen) days.   1   hydrocortisone 2.5 % ointment Apply topically 2 (two) times daily. 60 g 3   lamoTRIgine (LAMICTAL) 200 MG tablet TAKE ONE TABLET BY MOUTH DAILY 90 tablet 0   methylphenidate (RITALIN) 10 MG tablet TAKE 1 TABLET BY MOUTH 3 TIMES A DAY WITH MEALS 270 tablet 0   methylphenidate 36 MG PO CR tablet Take 1 tablet (36 mg total) by mouth daily. 90 tablet 0   SYRINGE-NEEDLE, DISP, 3 ML (EASY TOUCH SAFETY SYRINGE) 22G X 1" 3 ML MISC As directed for IM injections 50 each 3   Testosterone Enanthate 200 MG/ML SOLN Inject 200 mg into the muscle once a week. 10 mL 3   No current facility-administered medications for this visit.    Allergies  Allergen Reactions   Nsaids Other (See Comments)    Constipation   Subjective:  Patient presents for today's session. Patient continues to experience challenges having ongoing disagreements with his parents regarding the renovation of their property. These disagreements see facilitating the his identification of his needs and exploring communication strategies that he feels would be most effective in navigating his relationship with his parents. He was able to identify the importance of clear communication and setting boundaries however feel frustrated by  their lack of understanding of some aspects of the construction/renovation and then make demands.  Provide support and understanding as he identified how he plans to try to continue to communicate with them albeit differently based on the relationship differences.     Interventions: Motivational interviewing, supportive therapy  Diagnoses:    ICD-10-CM   1. Bipolar affective disorder, rapid cycling (HCC)  F31.9         Plan: Patient is to use CBT, mindfulness and coping skills to help manage / decrease symptoms associated with their diagnosis.   Continue to  take steps toward effective communication in his relationship with is parents to keep stress low.     Long-term goal:   Reduce overall level, frequency, and intensity of the feelings of depression, anxiety for at least 3 consecutive months per patient.   Short-term goal:  Verbally express understanding of the relationship between feelings of depression, anxiety and their impact on thinking patterns and behaviors. Verbalize an understanding of the role that distorted thinking plays in creating fears, excessive worry, and ruminations. 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 Patient to identify and utilize effective communication skills with others       Patient to report any concerns with his medications to his prescribing provider as needed.  Progress: progressing   [name], [facility]

## 2023-01-09 ENCOUNTER — Telehealth: Payer: Self-pay | Admitting: Psychiatry

## 2023-01-09 ENCOUNTER — Other Ambulatory Visit: Payer: Self-pay

## 2023-01-09 DIAGNOSIS — F902 Attention-deficit hyperactivity disorder, combined type: Secondary | ICD-10-CM

## 2023-01-09 MED ORDER — METHYLPHENIDATE HCL ER (OSM) 36 MG PO TBCR
36.0000 mg | EXTENDED_RELEASE_TABLET | Freq: Every day | ORAL | 0 refills | Status: DC
Start: 2023-01-09 — End: 2023-01-12

## 2023-01-09 MED ORDER — METHYLPHENIDATE HCL 10 MG PO TABS
ORAL_TABLET | ORAL | 0 refills | Status: DC
Start: 2023-01-09 — End: 2023-01-12

## 2023-01-09 NOTE — Telephone Encounter (Signed)
sent 

## 2023-01-09 NOTE — Telephone Encounter (Signed)
Francisco Morgan called at 9:15 to request refills of his Ritalin 10mg  #270 and Concerta 36mg  #90.  Appt 8/14.  Send of [facility] at [address] [place].  He said they have the supply.

## 2023-01-09 NOTE — Telephone Encounter (Signed)
Pt called back and said that [facility] is [facility]. He now wants everything sent [facility] since they do have it in stock

## 2023-01-12 ENCOUNTER — Other Ambulatory Visit: Payer: Self-pay

## 2023-01-12 DIAGNOSIS — F902 Attention-deficit hyperactivity disorder, combined type: Secondary | ICD-10-CM

## 2023-01-12 MED ORDER — METHYLPHENIDATE HCL 10 MG PO TABS: ORAL_TABLET | ORAL | 0 refills | Status: DC

## 2023-01-12 MED ORDER — METHYLPHENIDATE HCL ER (OSM) 36 MG PO TBCR
36.0000 mg | EXTENDED_RELEASE_TABLET | Freq: Every day | ORAL | 0 refills | Status: DC
Start: 2023-01-12 — End: 2023-04-13

## 2023-01-12 NOTE — Telephone Encounter (Signed)
Pt left message to cancel  Rx sent for Ritalin and Concerta to [facility] 6/14 and send to [facility]. Please rush while [facility] still has quantity needed.

## 2023-01-16 DIAGNOSIS — M9902 Segmental and somatic dysfunction of thoracic region: Secondary | ICD-10-CM | POA: Diagnosis not present

## 2023-01-16 DIAGNOSIS — M9901 Segmental and somatic dysfunction of cervical region: Secondary | ICD-10-CM | POA: Diagnosis not present

## 2023-01-16 DIAGNOSIS — M9905 Segmental and somatic dysfunction of pelvic region: Secondary | ICD-10-CM | POA: Diagnosis not present

## 2023-01-16 DIAGNOSIS — M9903 Segmental and somatic dysfunction of lumbar region: Secondary | ICD-10-CM | POA: Diagnosis not present

## 2023-01-22 ENCOUNTER — Ambulatory Visit: Payer: BC Managed Care – PPO | Admitting: Mental Health

## 2023-01-26 ENCOUNTER — Other Ambulatory Visit: Payer: Self-pay | Admitting: Internal Medicine

## 2023-02-03 DIAGNOSIS — M9903 Segmental and somatic dysfunction of lumbar region: Secondary | ICD-10-CM | POA: Diagnosis not present

## 2023-02-03 DIAGNOSIS — M9905 Segmental and somatic dysfunction of pelvic region: Secondary | ICD-10-CM | POA: Diagnosis not present

## 2023-02-03 DIAGNOSIS — M9902 Segmental and somatic dysfunction of thoracic region: Secondary | ICD-10-CM | POA: Diagnosis not present

## 2023-02-05 ENCOUNTER — Ambulatory Visit (INDEPENDENT_AMBULATORY_CARE_PROVIDER_SITE_OTHER): Payer: BC Managed Care – PPO | Admitting: Orthopedic Surgery

## 2023-02-05 DIAGNOSIS — M9903 Segmental and somatic dysfunction of lumbar region: Secondary | ICD-10-CM | POA: Diagnosis not present

## 2023-02-05 DIAGNOSIS — M9902 Segmental and somatic dysfunction of thoracic region: Secondary | ICD-10-CM | POA: Diagnosis not present

## 2023-02-05 DIAGNOSIS — M25871 Other specified joint disorders, right ankle and foot: Secondary | ICD-10-CM

## 2023-02-05 DIAGNOSIS — M1A00X0 Idiopathic chronic gout, unspecified site, without tophus (tophi): Secondary | ICD-10-CM | POA: Diagnosis not present

## 2023-02-05 DIAGNOSIS — M9905 Segmental and somatic dysfunction of pelvic region: Secondary | ICD-10-CM | POA: Diagnosis not present

## 2023-02-09 DIAGNOSIS — M9903 Segmental and somatic dysfunction of lumbar region: Secondary | ICD-10-CM | POA: Diagnosis not present

## 2023-02-09 DIAGNOSIS — M9902 Segmental and somatic dysfunction of thoracic region: Secondary | ICD-10-CM | POA: Diagnosis not present

## 2023-02-09 DIAGNOSIS — M9905 Segmental and somatic dysfunction of pelvic region: Secondary | ICD-10-CM | POA: Diagnosis not present

## 2023-02-11 ENCOUNTER — Encounter: Payer: Self-pay | Admitting: Internal Medicine

## 2023-02-11 ENCOUNTER — Ambulatory Visit (INDEPENDENT_AMBULATORY_CARE_PROVIDER_SITE_OTHER): Payer: BC Managed Care – PPO | Admitting: Internal Medicine

## 2023-02-11 VITALS — BP 130/78 | HR 91 | Temp 98.6°F | Ht 68.0 in | Wt 183.0 lb

## 2023-02-11 DIAGNOSIS — E291 Testicular hypofunction: Secondary | ICD-10-CM | POA: Diagnosis not present

## 2023-02-11 DIAGNOSIS — L4050 Arthropathic psoriasis, unspecified: Secondary | ICD-10-CM | POA: Diagnosis not present

## 2023-02-11 DIAGNOSIS — M5440 Lumbago with sciatica, unspecified side: Secondary | ICD-10-CM

## 2023-02-11 DIAGNOSIS — R739 Hyperglycemia, unspecified: Secondary | ICD-10-CM

## 2023-02-11 DIAGNOSIS — F3177 Bipolar disorder, in partial remission, most recent episode mixed: Secondary | ICD-10-CM | POA: Diagnosis not present

## 2023-02-11 DIAGNOSIS — Z0000 Encounter for general adult medical examination without abnormal findings: Secondary | ICD-10-CM | POA: Diagnosis not present

## 2023-02-11 LAB — CBC WITH DIFFERENTIAL/PLATELET
Basophils Absolute: 0 10*3/uL (ref 0.0–0.1)
Basophils Relative: 0.3 % (ref 0.0–3.0)
Eosinophils Absolute: 0.1 10*3/uL (ref 0.0–0.7)
Eosinophils Relative: 1.4 % (ref 0.0–5.0)
HCT: 46.8 % (ref 39.0–52.0)
Hemoglobin: 15.7 g/dL (ref 13.0–17.0)
Lymphocytes Relative: 51.8 % — ABNORMAL HIGH (ref 12.0–46.0)
Lymphs Abs: 4.3 10*3/uL — ABNORMAL HIGH (ref 0.7–4.0)
MCHC: 33.6 g/dL (ref 30.0–36.0)
MCV: 96.7 fl (ref 78.0–100.0)
Monocytes Absolute: 0.5 10*3/uL (ref 0.1–1.0)
Monocytes Relative: 6.3 % (ref 3.0–12.0)
Neutro Abs: 3.3 10*3/uL (ref 1.4–7.7)
Neutrophils Relative %: 40.2 % — ABNORMAL LOW (ref 43.0–77.0)
Platelets: 245 10*3/uL (ref 150.0–400.0)
RBC: 4.84 Mil/uL (ref 4.22–5.81)
RDW: 13.3 % (ref 11.5–15.5)
WBC: 8.3 10*3/uL (ref 4.0–10.5)

## 2023-02-11 LAB — COMPREHENSIVE METABOLIC PANEL
ALT: 23 U/L (ref 0–53)
AST: 21 U/L (ref 0–37)
Albumin: 4.6 g/dL (ref 3.5–5.2)
Alkaline Phosphatase: 52 U/L (ref 39–117)
BUN: 17 mg/dL (ref 6–23)
CO2: 31 mEq/L (ref 19–32)
Calcium: 10.2 mg/dL (ref 8.4–10.5)
Chloride: 98 mEq/L (ref 96–112)
Creatinine, Ser: 1.09 mg/dL (ref 0.40–1.50)
GFR: 81.6 mL/min (ref >60.00)
Glucose, Bld: 121 mg/dL — ABNORMAL HIGH (ref 70–99)
Potassium: 4.7 mEq/L (ref 3.5–5.1)
Sodium: 139 mEq/L (ref 135–145)
Total Bilirubin: 1.5 mg/dL — ABNORMAL HIGH (ref 0.2–1.2)
Total Protein: 7.1 g/dL (ref 6.0–8.3)

## 2023-02-11 LAB — URINALYSIS
Bilirubin Urine: NEGATIVE
Leukocytes,Ua: NEGATIVE
Nitrite: NEGATIVE
Specific Gravity, Urine: >=1.03 — AB (ref 1.000–1.030)
Total Protein, Urine: NEGATIVE
Urine Glucose: NEGATIVE
Urobilinogen, UA: 0.2 (ref 0.0–1.0)
pH: 6 (ref 5.0–8.0)

## 2023-02-11 LAB — LIPID PANEL
Cholesterol: 211 mg/dL — ABNORMAL HIGH (ref 0–200)
HDL: 70.4 mg/dL (ref >39.00)
LDL Cholesterol: 123 mg/dL — ABNORMAL HIGH (ref 0–99)
NonHDL: 140.49
Total CHOL/HDL Ratio: 3
Triglycerides: 87 mg/dL (ref 0.0–149.0)
VLDL: 17.4 mg/dL (ref 0.0–40.0)

## 2023-02-11 LAB — TESTOSTERONE: Testosterone: 142.36 ng/dL — ABNORMAL LOW (ref 300.00–890.00)

## 2023-02-11 LAB — HEMOGLOBIN A1C: Hgb A1c MFr Bld: 5.3 % (ref 4.6–6.5)

## 2023-02-11 LAB — PSA: PSA: 0.38 ng/mL (ref 0.10–4.00)

## 2023-02-11 LAB — TSH: TSH: 2.93 u[IU]/mL (ref 0.35–5.50)

## 2023-02-11 MED ORDER — TESTOSTERONE CYPIONATE 200 MG/ML IM SOLN: 200.0000 mg | INTRAMUSCULAR | 5 refills | Status: DC

## 2023-02-11 MED ORDER — METHYLPREDNISOLONE ACETATE 80 MG/ML IJ SUSP
80.0000 mg | Freq: Once | INTRAMUSCULAR | Status: AC
Start: 2023-02-11 — End: 2023-02-11
  Administered 2023-02-11: 80 mg via INTRAMUSCULAR

## 2023-02-11 MED ORDER — CELECOXIB 200 MG PO CAPS: 200.0000 mg | ORAL_CAPSULE | Freq: Two times a day (BID) | ORAL | 5 refills | Status: DC

## 2023-02-11 NOTE — Patient Instructions (Signed)
 Blue-Emu cream -- use 2-3 times a day ? ?

## 2023-02-11 NOTE — Assessment & Plan Note (Addendum)
MSK vs psoriatic arthritis - on Humira Worse - out of Celebrex x 2-3 wks Depo-medrol 80 mg IM
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
## 2023-02-11 NOTE — Assessment & Plan Note (Signed)
Stable

## 2023-02-11 NOTE — Addendum Note (Signed)
Addended by: Delsa Grana R on: 02/11/2023 09:40 AM   Modules accepted: Orders

## 2023-02-11 NOTE — Assessment & Plan Note (Signed)
Pt stopped Rx in 2023

## 2023-02-11 NOTE — Assessment & Plan Note (Signed)
Psoriatic arthritis - on Humira

## 2023-02-13 ENCOUNTER — Encounter: Payer: Self-pay | Admitting: Orthopedic Surgery

## 2023-02-13 DIAGNOSIS — M9905 Segmental and somatic dysfunction of pelvic region: Secondary | ICD-10-CM | POA: Diagnosis not present

## 2023-02-13 DIAGNOSIS — M1A00X0 Idiopathic chronic gout, unspecified site, without tophus (tophi): Secondary | ICD-10-CM

## 2023-02-13 DIAGNOSIS — M25871 Other specified joint disorders, right ankle and foot: Secondary | ICD-10-CM | POA: Diagnosis not present

## 2023-02-13 DIAGNOSIS — M9903 Segmental and somatic dysfunction of lumbar region: Secondary | ICD-10-CM | POA: Diagnosis not present

## 2023-02-13 DIAGNOSIS — M9902 Segmental and somatic dysfunction of thoracic region: Secondary | ICD-10-CM | POA: Diagnosis not present

## 2023-02-13 MED ORDER — METHYLPREDNISOLONE ACETATE 40 MG/ML IJ SUSP
40.0000 mg | INTRAMUSCULAR | Status: AC | PRN
Start: 2023-02-13 — End: 2023-02-13
  Administered 2023-02-13: 40 mg via INTRA_ARTICULAR

## 2023-02-13 MED ORDER — LIDOCAINE HCL 1 % IJ SOLN
2.0000 mL | INTRAMUSCULAR | Status: AC | PRN
Start: 2023-02-13 — End: 2023-02-13
  Administered 2023-02-13: 2 mL
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
## 2023-02-17 NOTE — Addendum Note (Signed)
Addended by: Tresa Garter on: 02/17/2023 07:06 AM   Modules accepted: Orders

## 2023-02-24 DIAGNOSIS — M25372 Other instability, left ankle: Secondary | ICD-10-CM | POA: Diagnosis not present

## 2023-02-24 DIAGNOSIS — S93402A Sprain of unspecified ligament of left ankle, initial encounter: Secondary | ICD-10-CM | POA: Diagnosis not present

## 2023-02-24 DIAGNOSIS — L4052 Psoriatic arthritis mutilans: Secondary | ICD-10-CM | POA: Diagnosis not present

## 2023-02-24 DIAGNOSIS — M25572 Pain in left ankle and joints of left foot: Secondary | ICD-10-CM | POA: Diagnosis not present

## 2023-02-25 ENCOUNTER — Encounter: Payer: Self-pay | Admitting: Orthopedic Surgery

## 2023-02-25 DIAGNOSIS — G8929 Other chronic pain: Secondary | ICD-10-CM

## 2023-02-26 ENCOUNTER — Ambulatory Visit: Payer: BC Managed Care – PPO | Admitting: Mental Health

## 2023-03-11 ENCOUNTER — Other Ambulatory Visit: Payer: Self-pay | Admitting: Psychiatry

## 2023-03-11 ENCOUNTER — Ambulatory Visit (INDEPENDENT_AMBULATORY_CARE_PROVIDER_SITE_OTHER): Payer: BC Managed Care – PPO | Admitting: Psychiatry

## 2023-03-11 ENCOUNTER — Encounter: Payer: Self-pay | Admitting: Psychiatry

## 2023-03-11 DIAGNOSIS — F902 Attention-deficit hyperactivity disorder, combined type: Secondary | ICD-10-CM | POA: Diagnosis not present

## 2023-03-11 DIAGNOSIS — F319 Bipolar disorder, unspecified: Secondary | ICD-10-CM | POA: Diagnosis not present

## 2023-03-11 DIAGNOSIS — F310 Bipolar disorder, current episode hypomanic: Secondary | ICD-10-CM

## 2023-03-11 DIAGNOSIS — F4310 Post-traumatic stress disorder, unspecified: Secondary | ICD-10-CM | POA: Diagnosis not present

## 2023-03-11 MED ORDER — CLONAZEPAM 0.5 MG PO TABS
0.5000 mg | ORAL_TABLET | Freq: Three times a day (TID) | ORAL | 2 refills | Status: DC | PRN
Start: 2023-03-11 — End: 2023-07-13

## 2023-03-11 MED ORDER — LAMOTRIGINE 200 MG PO TABS
200.0000 mg | ORAL_TABLET | Freq: Every day | ORAL | 0 refills | Status: DC
Start: 2023-03-11 — End: 2023-05-31

## 2023-03-11 MED ORDER — DIVALPROEX SODIUM 500 MG PO DR TAB
2500.0000 mg | DELAYED_RELEASE_TABLET | Freq: Every day | ORAL | 0 refills | Status: DC
Start: 2023-03-11 — End: 2023-06-08
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
## 2023-03-12 ENCOUNTER — Ambulatory Visit: Payer: BC Managed Care – PPO | Admitting: Mental Health

## 2023-03-12 DIAGNOSIS — F319 Bipolar disorder, unspecified: Secondary | ICD-10-CM | POA: Diagnosis not present

## 2023-03-12 NOTE — Progress Notes (Signed)
Psychotherapy Note  Name: Francisco Morgan Date:  03/12/23 MRN: [id number] DOB: 05/04/1976 PCP: Tresa Garter, MD  Time in: 1:00pm Time out:  1:54pm  Treatment: Individual therapy  Mental Status Exam: Patient Appearance:    casual  Behavior:   appropriate  Motor:   WNL  Speech/Language:     Clear, coherent  Affect:   Full range  Mood:   euthymic  Thought process:   normal  Thought content:     WNL  Sensory/Perceptual disturbances:     WNL  Orientation:   x4  Attention:   Good  Concentration:   Good  Memory:   WNL  Fund of knowledge:    WNL  Insight:     good  Judgment:    good  Impulse Control:   good   Reported Symptoms:  Decreased sleep intermittently, some anxiety and irritability, ruminations  Risk Assessment: Danger to Self:  No Self-injurious Behavior: No Danger to Others: No Duty to Warn:no Physical Aggression / Violence:No  Access to Firearms a concern: No  Gang Involvement:No  Patient / guardian was educated about steps to take if suicide or homicide risk level increases between visits: yes While future psychiatric events cannot be accurately predicted, the patient does not currently require acute inpatient psychiatric care and does not currently meet [facility] involuntary commitment criteria.  Medications: Current Outpatient Medications  Medication Sig Dispense Refill   Acetylcysteine 600 MG CAPS Take 600 mg by mouth daily.     b complex vitamins tablet Take 1 tablet by mouth daily. 100 tablet 3   celecoxib (CELEBREX) 200 MG capsule Take 1 capsule (200 mg total) by mouth 2 (two) times daily. Annual appt due is due must see provider for future refills 60 capsule 5   Cholecalciferol (VITAMIN D) 2000 units CAPS 1 daily 100 capsule 3   clonazePAM (KLONOPIN) 0.5 MG tablet Take 1 tablet (0.5 mg total) by mouth 3 (three) times daily as needed. for anxiety 60 tablet 2   divalproex (DEPAKOTE) 500 MG DR tablet Take 5 tablets (2,500 mg total) by mouth at  bedtime. 450 tablet 0   esomeprazole (NEXIUM) 40 MG capsule Take 1 capsule (40 mg total) by mouth daily before breakfast. 90 capsule 4   HUMIRA PEN 40 MG/0.4ML PNKT Inject 40 mg into the muscle every 14 (fourteen) days.   1   hydrocortisone 2.5 % ointment Apply topically 2 (two) times daily. 60 g 3   lamoTRIgine (LAMICTAL) 200 MG tablet Take 1 tablet (200 mg total) by mouth daily. 90 tablet 0   methylphenidate (RITALIN) 10 MG tablet TAKE 1 TABLET BY MOUTH 3 TIMES A DAY WITH MEALS 270 tablet 0   methylphenidate 36 MG PO CR tablet Take 1 tablet (36 mg total) by mouth daily. 90 tablet 0   SYRINGE-NEEDLE, DISP, 3 ML (EASY TOUCH SAFETY SYRINGE) 22G X 1" 3 ML MISC As directed for IM injections 50 each 3   testosterone cypionate (DEPOTESTOSTERONE CYPIONATE) 200 MG/ML injection Inject 1 mL (200 mg total) into the muscle every 14 (fourteen) days. 10 mL 5   No current facility-administered medications for this visit.    Allergies  Allergen Reactions   Nsaids Other (See Comments)    Constipation   Subjective:  Patient presents for today's session.  Assessed progress, recent events since last session which was approximately 2 months ago.  He shared some ongoing challenges, suffering a right ankle sprain wearing a support boot.  Reports getting this injury while  on vacation a couple of weeks ago.  Some ongoing challenges in the relationship with his parents, primarily centered around completing the renovation project.  Patient went on to share his concerns re; feeling despondent, unsatisfied with how his life had been for the the last several years, he feels he has challenges with executive functioning concerns that may be related to a TBI that he suffered about 10 years ago. He plans to engage in neuropsych testing neuropsych testing. Facilitated his identifying stressors and associated feelings.  Provided support throughout.    Interventions: Motivational interviewing, supportive therapy  Diagnoses:     ICD-10-CM   1. Bipolar affective disorder, rapid cycling (HCC)  F31.9          Plan: Patient is to use CBT, mindfulness and coping skills to help manage / decrease symptoms associated with their diagnosis.   Continue to take steps toward effective communication in his relationship with is parents to keep stress low.     Long-term goal:   Reduce overall level, frequency, and intensity of the feelings of depression, anxiety for at least 3 consecutive months per patient.   Short-term goal:  Verbally express understanding of the relationship between feelings of depression, anxiety and their impact on thinking patterns and behaviors. Verbalize an understanding of the role that distorted thinking plays in creating fears, excessive worry, and ruminations. 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 Patient to identify and utilize effective communication skills with others       Patient to report any concerns with his medications to his prescribing provider as needed.  Progress: progressing   [name], [facility]

## 2023-03-26 ENCOUNTER — Ambulatory Visit (INDEPENDENT_AMBULATORY_CARE_PROVIDER_SITE_OTHER): Payer: BC Managed Care – PPO | Admitting: Mental Health

## 2023-03-26 DIAGNOSIS — F319 Bipolar disorder, unspecified: Secondary | ICD-10-CM | POA: Diagnosis not present
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
## 2023-04-09 ENCOUNTER — Ambulatory Visit (INDEPENDENT_AMBULATORY_CARE_PROVIDER_SITE_OTHER): Payer: BC Managed Care – PPO | Admitting: Mental Health

## 2023-04-09 DIAGNOSIS — F319 Bipolar disorder, unspecified: Secondary | ICD-10-CM | POA: Diagnosis not present

## 2023-04-09 NOTE — Progress Notes (Signed)
Psychotherapy Note  Name: Francisco Morgan Date:  04/09/23 MRN: [id number] DOB: May 13, 1976 PCP: Tresa Garter, MD  Time in: 1:00pm Time out:  1:55pm  Session time: 55 minutes  Treatment: Individual therapy  Mental Status Exam: Patient Appearance:    casual  Behavior:   appropriate  Motor:   WNL  Speech/Language:     Clear, coherent  Affect:   Full range  Mood:   euthymic  Thought process:   normal  Thought content:     WNL  Sensory/Perceptual disturbances:     WNL  Orientation:   x4  Attention:   Good  Concentration:   Good  Memory:   WNL  Fund of knowledge:    WNL  Insight:     good  Judgment:    good  Impulse Control:   good   Reported Symptoms:  Decreased sleep intermittently, some anxiety and irritability, ruminations  Risk Assessment: Danger to Self:  No Self-injurious Behavior: No Danger to Others: No Duty to Warn:no Physical Aggression / Violence:No  Access to Firearms a concern: No  Gang Involvement:No  Patient / guardian was educated about steps to take if suicide or homicide risk level increases between visits: yes While future psychiatric events cannot be accurately predicted, the patient does not currently require acute inpatient psychiatric care and does not currently meet [facility] involuntary commitment criteria.  Medications: Current Outpatient Medications  Medication Sig Dispense Refill   Acetylcysteine 600 MG CAPS Take 600 mg by mouth daily.     b complex vitamins tablet Take 1 tablet by mouth daily. 100 tablet 3   celecoxib (CELEBREX) 200 MG capsule Take 1 capsule (200 mg total) by mouth 2 (two) times daily. Annual appt due is due must see provider for future refills 60 capsule 5   Cholecalciferol (VITAMIN D) 2000 units CAPS 1 daily 100 capsule 3   clonazePAM (KLONOPIN) 0.5 MG tablet Take 1 tablet (0.5 mg total) by mouth 3 (three) times daily as needed. for anxiety 60 tablet 2   divalproex (DEPAKOTE) 500 MG DR tablet Take 5 tablets  (2,500 mg total) by mouth at bedtime. 450 tablet 0   esomeprazole (NEXIUM) 40 MG capsule Take 1 capsule (40 mg total) by mouth daily before breakfast. 90 capsule 4   HUMIRA PEN 40 MG/0.4ML PNKT Inject 40 mg into the muscle every 14 (fourteen) days.   1   hydrocortisone 2.5 % ointment Apply topically 2 (two) times daily. 60 g 3   lamoTRIgine (LAMICTAL) 200 MG tablet Take 1 tablet (200 mg total) by mouth daily. 90 tablet 0   methylphenidate (RITALIN) 10 MG tablet TAKE 1 TABLET BY MOUTH 3 TIMES A DAY WITH MEALS 270 tablet 0   methylphenidate 36 MG PO CR tablet Take 1 tablet (36 mg total) by mouth daily. 90 tablet 0   SYRINGE-NEEDLE, DISP, 3 ML (EASY TOUCH SAFETY SYRINGE) 22G X 1" 3 ML MISC As directed for IM injections 50 each 3   testosterone cypionate (DEPOTESTOSTERONE CYPIONATE) 200 MG/ML injection Inject 1 mL (200 mg total) into the muscle every 14 (fourteen) days. 10 mL 5   No current facility-administered medications for this visit.    Allergies  Allergen Reactions   Nsaids Other (See Comments)    Constipation   Subjective:  Patient presents for today's session.  Assessed progress, recent events since last session.  He shared the ongoing stress related to family.  He stated that he had discussion that was difficult this morning with  his parents where he needed an payment advance for his work on the renovation project due to his having mounting financial stress.  Patient went on to share details regarding the stress financially and interactions with his parents, the difficulty of communication specifically with his mother.  Patient went on to share how he feels he is treated differently than his sister and her family going on to share examples.  Provide support and space for him to identify feelings related to the stress of communicating with his mother.  He stated that he was able to be effective with his communication therefore avoiding and argument.  He stated that he looks forward to this  project being completed continues to be unsure of when this will occur however, notes that he will be somewhat relieved when it is and plans to look for full-time work after that time frame.    Interventions: Motivational interviewing, supportive therapy  Diagnoses:    ICD-10-CM   1. Bipolar affective disorder, rapid cycling (HCC)  F31.9            Plan: Patient is to use CBT, mindfulness and coping skills to help manage / decrease symptoms associated with their diagnosis.   Continue to take steps toward effective communication in his relationship with is parents to keep stress low.     Long-term goal:   Reduce overall level, frequency, and intensity of the feelings of depression, anxiety for at least 3 consecutive months per patient.   Short-term goal:  Verbally express understanding of the relationship between feelings of depression, anxiety and their impact on thinking patterns and behaviors. Verbalize an understanding of the role that distorted thinking plays in creating fears, excessive worry, and ruminations. 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 Patient to identify and utilize effective communication skills with others       Patient to report any concerns with his medications to his prescribing provider as needed.  Progress: progressing   [name], [facility]

## 2023-04-13 ENCOUNTER — Other Ambulatory Visit: Payer: Self-pay

## 2023-04-13 ENCOUNTER — Telehealth: Payer: Self-pay | Admitting: Psychiatry

## 2023-04-13 DIAGNOSIS — F902 Attention-deficit hyperactivity disorder, combined type: Secondary | ICD-10-CM

## 2023-04-13 MED ORDER — METHYLPHENIDATE HCL ER (OSM) 36 MG PO TBCR: 36.0000 mg | EXTENDED_RELEASE_TABLET | Freq: Every day | ORAL | 0 refills | Status: DC

## 2023-04-13 MED ORDER — METHYLPHENIDATE HCL 10 MG PO TABS: ORAL_TABLET | ORAL | 0 refills | Status: DC

## 2023-04-13 NOTE — Telephone Encounter (Signed)
Patient lvm requesting refills on the Concerta 10 mg a 90 day supply, and Ritalin CR 36 mg a 30 day supply, both generic. Fill at the [facility] on [address] per his request.  Appointment 05/13/23

## 2023-04-13 NOTE — Telephone Encounter (Signed)
Pended.

## 2023-04-17 DIAGNOSIS — M9903 Segmental and somatic dysfunction of lumbar region: Secondary | ICD-10-CM | POA: Diagnosis not present

## 2023-04-17 DIAGNOSIS — M9905 Segmental and somatic dysfunction of pelvic region: Secondary | ICD-10-CM | POA: Diagnosis not present

## 2023-04-17 DIAGNOSIS — M9902 Segmental and somatic dysfunction of thoracic region: Secondary | ICD-10-CM | POA: Diagnosis not present

## 2023-04-20 DIAGNOSIS — M9905 Segmental and somatic dysfunction of pelvic region: Secondary | ICD-10-CM | POA: Diagnosis not present

## 2023-04-20 DIAGNOSIS — M9903 Segmental and somatic dysfunction of lumbar region: Secondary | ICD-10-CM | POA: Diagnosis not present

## 2023-04-20 DIAGNOSIS — M9902 Segmental and somatic dysfunction of thoracic region: Secondary | ICD-10-CM | POA: Diagnosis not present

## 2023-04-23 ENCOUNTER — Telehealth: Payer: Self-pay | Admitting: Psychiatry

## 2023-04-23 ENCOUNTER — Ambulatory Visit: Payer: BC Managed Care – PPO | Admitting: Mental Health

## 2023-04-23 ENCOUNTER — Other Ambulatory Visit: Payer: BC Managed Care – PPO

## 2023-04-23 DIAGNOSIS — E291 Testicular hypofunction: Secondary | ICD-10-CM | POA: Diagnosis not present

## 2023-04-23 DIAGNOSIS — R739 Hyperglycemia, unspecified: Secondary | ICD-10-CM

## 2023-04-23 DIAGNOSIS — F319 Bipolar disorder, unspecified: Secondary | ICD-10-CM

## 2023-04-23 LAB — COMPREHENSIVE METABOLIC PANEL
ALT: 13 U/L (ref 0–53)
AST: 15 U/L (ref 0–37)
Albumin: 4.5 g/dL (ref 3.5–5.2)
Alkaline Phosphatase: 43 U/L (ref 39–117)
BUN: 13 mg/dL (ref 6–23)
CO2: 26 mEq/L (ref 19–32)
Calcium: 10 mg/dL (ref 8.4–10.5)
Chloride: 100 mEq/L (ref 96–112)
Creatinine, Ser: 0.94 mg/dL (ref 0.40–1.50)
GFR: 97.33 mL/min (ref >60.00)
Glucose, Bld: 109 mg/dL — ABNORMAL HIGH (ref 70–99)
Potassium: 3.8 mEq/L (ref 3.5–5.1)
Sodium: 138 mEq/L (ref 135–145)
Total Bilirubin: 0.9 mg/dL (ref 0.2–1.2)
Total Protein: 7.5 g/dL (ref 6.0–8.3)

## 2023-04-23 LAB — TESTOSTERONE: Testosterone: 284.46 ng/dL — ABNORMAL LOW (ref 300.00–890.00)

## 2023-04-23 NOTE — Telephone Encounter (Signed)
Patient lvm at 2:31 regarding lab orders states that he would like to know where [facility] sent them to. He went to [facility] and they informed him that they don't accept labs from [facility]. Pls rtc [phone]

## 2023-04-23 NOTE — Telephone Encounter (Signed)
Valproic acid level   Lamotrigine level    I see the above from last note, but don't see that an order was placed. LVM to [facility].

## 2023-04-23 NOTE — Progress Notes (Signed)
Psychotherapy Note  Name: Francisco Morgan Date:  04/09/23 MRN: [id number] DOB: 1975/07/29 PCP: Francisco Garter, MD  Time in: 1:00pm Time out:  1:55pm  Session time: 55 minutes  Treatment: Individual therapy  Mental Status Exam: Patient Appearance:    casual  Behavior:   appropriate  Motor:   WNL  Speech/Language:     Clear, coherent  Affect:   Full range  Mood:   euthymic  Thought process:   normal  Thought content:     WNL  Sensory/Perceptual disturbances:     WNL  Orientation:   x4  Attention:   Good  Concentration:   Good  Memory:   WNL  Fund of knowledge:    WNL  Insight:     good  Judgment:    good  Impulse Control:   good   Reported Symptoms:  Decreased sleep intermittently, some anxiety and irritability, ruminations  Risk Assessment: Danger to Self:  No Self-injurious Behavior: No Danger to Others: No Duty to Warn:no Physical Aggression / Violence:No  Access to Firearms a concern: No  Gang Involvement:No  Patient / guardian was educated about steps to take if suicide or homicide risk level increases between visits: yes While future psychiatric events cannot be accurately predicted, the patient does not currently require acute inpatient psychiatric care and does not currently meet Francisco Morgan involuntary commitment criteria.  Medications: Current Outpatient Medications  Medication Sig Dispense Refill   Acetylcysteine 600 MG CAPS Take 600 mg by mouth daily.     b complex vitamins tablet Take 1 tablet by mouth daily. 100 tablet 3   celecoxib (CELEBREX) 200 MG capsule Take 1 capsule (200 mg total) by mouth 2 (two) times daily. Annual appt due is due must see provider for future refills 60 capsule 5   Cholecalciferol (VITAMIN D) 2000 units CAPS 1 daily 100 capsule 3   clonazePAM (KLONOPIN) 0.5 MG tablet Take 1 tablet (0.5 mg total) by mouth 3 (three) times daily as needed. for anxiety 60 tablet 2   divalproex (DEPAKOTE) 500 MG DR tablet Take 5 tablets  (2,500 mg total) by mouth at bedtime. 450 tablet 0   esomeprazole (NEXIUM) 40 MG capsule Take 1 capsule (40 mg total) by mouth daily before breakfast. 90 capsule 4   HUMIRA PEN 40 MG/0.4ML PNKT Inject 40 mg into the muscle every 14 (fourteen) days.   1   hydrocortisone 2.5 % ointment Apply topically 2 (two) times daily. 60 g 3   lamoTRIgine (LAMICTAL) 200 MG tablet Take 1 tablet (200 mg total) by mouth daily. 90 tablet 0   methylphenidate (RITALIN) 10 MG tablet TAKE 1 TABLET BY MOUTH 3 TIMES A DAY WITH MEALS 270 tablet 0   methylphenidate 36 MG PO CR tablet Take 1 tablet (36 mg total) by mouth daily. 90 tablet 0   SYRINGE-NEEDLE, DISP, 3 ML (EASY TOUCH SAFETY SYRINGE) 22G X 1" 3 ML MISC As directed for IM injections 50 each 3   testosterone cypionate (DEPOTESTOSTERONE CYPIONATE) 200 MG/ML injection Inject 1 mL (200 mg total) into the muscle every 14 (fourteen) days. 10 mL 5   No current facility-administered medications for this visit.    Allergies  Allergen Reactions   Nsaids Other (See Comments)    Constipation   Subjective:  Patient presents for today's session.  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  This being more challenging due to an injury to his left foot where he  remains in recovery with a boot.  He stated this is hampered some of his progress, ability to work as frequently or as long as he could typically.  Family interactions were further explored where he stated that he is not received as much pressure from his parents recently, sharing experiences of both his mother and father.  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  Patient was encouraged to recognize his taking steps towards self-care, and is making progress with the family project.   Interventions: Motivational  interviewing, supportive therapy  Diagnoses:    ICD-10-CM   1. Bipolar affective disorder, rapid cycling (HCC)  F31.9             Plan: Patient is to use CBT, mindfulness and coping skills to help manage / decrease symptoms associated with their diagnosis.   Continue to take steps toward effective communication in his relationship with is parents to keep stress low.     Long-term goal:   Reduce overall level, frequency, and intensity of the feelings of depression, anxiety for at least 3 consecutive months per patient.   Short-term goal:  Verbally express understanding of the relationship between feelings of depression, anxiety and their impact on thinking patterns and behaviors. Verbalize an understanding of the role that distorted thinking plays in creating fears, excessive worry, and ruminations. Patient to utilize coping skills as discussed in session. Patient to identify and utilize effective communication skills with others       Patient to report any concerns with his medications to his prescribing provider as needed.  Progress: progressing   [name], [facility]

## 2023-04-24 ENCOUNTER — Other Ambulatory Visit: Payer: Self-pay | Admitting: Psychiatry

## 2023-04-24 DIAGNOSIS — F319 Bipolar disorder, unspecified: Secondary | ICD-10-CM

## 2023-04-24 NOTE — Telephone Encounter (Signed)
Patient is asking about labs. I don't see any active labs. Your last note mentions a valproic acid level and Lamotrigine level.

## 2023-04-24 NOTE — Telephone Encounter (Signed)
LVM with info per DPR.  

## 2023-04-24 NOTE — Telephone Encounter (Signed)
[facility] to [facility]  

## 2023-05-02 ENCOUNTER — Encounter: Payer: Self-pay | Admitting: Internal Medicine

## 2023-05-07 ENCOUNTER — Ambulatory Visit: Payer: BC Managed Care – PPO | Admitting: Mental Health

## 2023-05-07 DIAGNOSIS — F319 Bipolar disorder, unspecified: Secondary | ICD-10-CM

## 2023-05-07 NOTE — Progress Notes (Signed)
Psychotherapy Note  Name: Francisco Morgan Date:  05/07/23 MRN: [id number] DOB: [birth year] PCP: Tresa Garter, MD  Time in: 1:00pm Time out:  1:54pm  Session time: 54 minutes  Treatment: Individual therapy  Mental Status Exam: Patient Appearance:    casual  Behavior:   appropriate  Motor:   WNL  Speech/Language:     Clear, coherent  Affect:   Full range  Mood:   euthymic  Thought process:   normal  Thought content:     WNL  Sensory/Perceptual disturbances:     WNL  Orientation:   x4  Attention:   Good  Concentration:   Good  Memory:   WNL  Fund of knowledge:    WNL  Insight:     good  Judgment:    good  Impulse Control:   good   Reported Symptoms:  Decreased sleep intermittently, some anxiety and irritability, ruminations  Risk Assessment: Danger to Self:  No Self-injurious Behavior: No Danger to Others: No Duty to Warn:no Physical Aggression / Violence:No  Access to Firearms a concern: No  Gang Involvement:No  Patient / guardian was educated about steps to take if suicide or homicide risk level increases between visits: yes While future psychiatric events cannot be accurately predicted, the patient does not currently require acute inpatient psychiatric care and does not currently meet [facility] involuntary commitment criteria.  Medications: Current Outpatient Medications  Medication Sig Dispense Refill   Acetylcysteine 600 MG CAPS Take 600 mg by mouth daily.     b complex vitamins tablet Take 1 tablet by mouth daily. 100 tablet 3   celecoxib (CELEBREX) 200 MG capsule Take 1 capsule (200 mg total) by mouth 2 (two) times daily. Annual appt due is due must see provider for future refills 60 capsule 5   Cholecalciferol (VITAMIN D) 2000 units CAPS 1 daily 100 capsule 3   clonazePAM (KLONOPIN) 0.5 MG tablet Take 1 tablet (0.5 mg total) by mouth 3 (three) times daily as needed. for anxiety 60 tablet 2   divalproex (DEPAKOTE) 500 MG DR tablet Take 5 tablets  (2,500 mg total) by mouth at bedtime. 450 tablet 0   esomeprazole (NEXIUM) 40 MG capsule Take 1 capsule (40 mg total) by mouth daily before breakfast. 90 capsule 4   HUMIRA PEN 40 MG/0.4ML PNKT Inject 40 mg into the muscle every 14 (fourteen) days.   1   hydrocortisone 2.5 % ointment Apply topically 2 (two) times daily. 60 g 3   lamoTRIgine (LAMICTAL) 200 MG tablet Take 1 tablet (200 mg total) by mouth daily. 90 tablet 0   methylphenidate (RITALIN) 10 MG tablet TAKE 1 TABLET BY MOUTH 3 TIMES A DAY WITH MEALS 270 tablet 0   methylphenidate 36 MG PO CR tablet Take 1 tablet (36 mg total) by mouth daily. 90 tablet 0   SYRINGE-NEEDLE, DISP, 3 ML (EASY TOUCH SAFETY SYRINGE) 22G X 1" 3 ML MISC As directed for IM injections 50 each 3   testosterone cypionate (DEPOTESTOSTERONE CYPIONATE) 200 MG/ML injection Inject 1 mL (200 mg total) into the muscle every 14 (fourteen) days. 10 mL 5   No current facility-administered medications for this visit.    Allergies  Allergen Reactions   Nsaids Other (See Comments)    Constipation   Subjective:  Patient presents for today's session.  Patient shared challenges related to family.  He said his parents have been more recently arguments and some mention of divorce has ensued.  He stated this has occurred  before where he stated he recently tried to intervene in their arguments to assist in de-escalation.  Differences in a relationship with his father versus with his mother where she further shared.  He stated that his mother can have a more rigid thinking style, how he typically can get along with his father and have an easier level of communication typically.  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  He went on to share past relationships, 2 of which he was in that were more serious years ago that ultimately ended and leaving him  feeling a sense of hopelessness due to how he was treated.  Provide space for patient to further identify feelings related, experiences while providing support and acknowledgment. Patient was encouraged to recognize presently his continuing to make notable progress with his work project, and how he has been able to persevere.  Interventions: Motivational interviewing, supportive therapy  Diagnoses:    ICD-10-CM   1. Bipolar affective disorder, rapid cycling (HCC)  F31.9              Plan: Patient is to use CBT, mindfulness and coping skills to help manage / decrease symptoms associated with their diagnosis.   Continue to take steps toward effective communication in his relationship with is parents to keep stress low.     Long-term goal:   Reduce overall level, frequency, and intensity of the feelings of depression, anxiety for at least 3 consecutive months per patient.   Short-term goal:  Verbally express understanding of the relationship between feelings of depression, anxiety and their impact on thinking patterns and behaviors. Verbalize an understanding of the role that distorted thinking plays in creating fears, excessive worry, and ruminations. Patient to utilize coping skills as discussed in session. Patient to identify and utilize effective communication skills with others       Patient to report any concerns with his medications to his prescribing provider as needed.  Progress: progressing   [name], [facility]

## 2023-05-11 ENCOUNTER — Other Ambulatory Visit: Payer: Self-pay

## 2023-05-11 ENCOUNTER — Telehealth: Payer: Self-pay | Admitting: Psychiatry

## 2023-05-11 DIAGNOSIS — F902 Attention-deficit hyperactivity disorder, combined type: Secondary | ICD-10-CM

## 2023-05-11 MED ORDER — METHYLPHENIDATE HCL 10 MG PO TABS: ORAL_TABLET | ORAL | 0 refills | Status: DC

## 2023-05-11 NOTE — Telephone Encounter (Signed)
Pended.

## 2023-05-11 NOTE — Telephone Encounter (Signed)
Pt Lvm @ 7:57a requesting refill Methylphenidate 10mg  90# to   [id number] [place], [place] - [address] [place], [place] [address] Phone: [phone]  Fax: [phone]    Next appt 10/16

## 2023-05-13 ENCOUNTER — Encounter: Payer: Self-pay | Admitting: Psychiatry

## 2023-05-13 ENCOUNTER — Ambulatory Visit: Payer: BC Managed Care – PPO | Admitting: Psychiatry

## 2023-05-13 DIAGNOSIS — F4310 Post-traumatic stress disorder, unspecified: Secondary | ICD-10-CM

## 2023-05-13 DIAGNOSIS — F319 Bipolar disorder, unspecified: Secondary | ICD-10-CM | POA: Diagnosis not present

## 2023-05-13 DIAGNOSIS — F902 Attention-deficit hyperactivity disorder, combined type: Secondary | ICD-10-CM | POA: Diagnosis not present

## 2023-05-13 DIAGNOSIS — F5105 Insomnia due to other mental disorder: Secondary | ICD-10-CM | POA: Diagnosis not present

## 2023-05-13 MED ORDER — TRAZODONE HCL 50 MG PO TABS
ORAL_TABLET | ORAL | 0 refills | Status: DC
Start: 2023-05-13 — End: 2023-07-01
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
## 2023-05-18 ENCOUNTER — Encounter: Payer: BC Managed Care – PPO | Admitting: Internal Medicine

## 2023-05-18 ENCOUNTER — Ambulatory Visit: Payer: BC Managed Care – PPO | Admitting: Physical Therapy
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
## 2023-05-21 ENCOUNTER — Ambulatory Visit: Payer: BC Managed Care – PPO | Admitting: Mental Health

## 2023-05-26 DIAGNOSIS — M9902 Segmental and somatic dysfunction of thoracic region: Secondary | ICD-10-CM | POA: Diagnosis not present

## 2023-05-26 DIAGNOSIS — M9903 Segmental and somatic dysfunction of lumbar region: Secondary | ICD-10-CM | POA: Diagnosis not present

## 2023-05-26 DIAGNOSIS — M9905 Segmental and somatic dysfunction of pelvic region: Secondary | ICD-10-CM | POA: Diagnosis not present

## 2023-05-28 DIAGNOSIS — M9905 Segmental and somatic dysfunction of pelvic region: Secondary | ICD-10-CM | POA: Diagnosis not present

## 2023-05-28 DIAGNOSIS — M9903 Segmental and somatic dysfunction of lumbar region: Secondary | ICD-10-CM | POA: Diagnosis not present

## 2023-05-28 DIAGNOSIS — M9902 Segmental and somatic dysfunction of thoracic region: Secondary | ICD-10-CM | POA: Diagnosis not present

## 2023-05-30 ENCOUNTER — Other Ambulatory Visit: Payer: Self-pay | Admitting: Psychiatry

## 2023-05-30 DIAGNOSIS — F310 Bipolar disorder, current episode hypomanic: Secondary | ICD-10-CM

## 2023-06-04 ENCOUNTER — Ambulatory Visit: Payer: BC Managed Care – PPO | Admitting: Mental Health

## 2023-06-04 DIAGNOSIS — F319 Bipolar disorder, unspecified: Secondary | ICD-10-CM

## 2023-06-04 NOTE — Progress Notes (Signed)
Psychotherapy Note  Name: Francisco Morgan Date:  06/04/23 MRN: [id number] DOB: 1975-11-05 PCP: Tresa Garter, MD  Time in: 1:00pm Time out:  1:52pm  Session time: 52 minutes  Treatment: Individual therapy  Mental Status Exam: Patient Appearance:    casual  Behavior:   appropriate  Motor:   WNL  Speech/Language:     Clear, coherent  Affect:   Full range  Mood:   euthymic  Thought process:   normal  Thought content:     WNL  Sensory/Perceptual disturbances:     WNL  Orientation:   x4  Attention:   Good  Concentration:   Good  Memory:   WNL  Fund of knowledge:    WNL  Insight:     good  Judgment:    good  Impulse Control:   good   Reported Symptoms:  Decreased sleep intermittently, some anxiety and irritability, ruminations  Risk Assessment: Danger to Self:  No Self-injurious Behavior: No Danger to Others: No Duty to Warn:no Physical Aggression / Violence:No  Access to Firearms a concern: No  Gang Involvement:No  Patient / guardian was educated about steps to take if suicide or homicide risk level increases between visits: yes While future psychiatric events cannot be accurately predicted, the patient does not currently require acute inpatient psychiatric care and does not currently meet [facility] involuntary commitment criteria.  Medications: Current Outpatient Medications  Medication Sig Dispense Refill   Acetylcysteine 600 MG CAPS Take 600 mg by mouth daily.     b complex vitamins tablet Take 1 tablet by mouth daily. 100 tablet 3   celecoxib (CELEBREX) 200 MG capsule Take 1 capsule (200 mg total) by mouth 2 (two) times daily. Annual appt due is due must see provider for future refills 60 capsule 5   Cholecalciferol (VITAMIN D) 2000 units CAPS 1 daily 100 capsule 3   clonazePAM (KLONOPIN) 0.5 MG tablet Take 1 tablet (0.5 mg total) by mouth 3 (three) times daily as needed. for anxiety 60 tablet 2   divalproex (DEPAKOTE) 500 MG DR tablet Take 5 tablets  (2,500 mg total) by mouth at bedtime. 450 tablet 0   esomeprazole (NEXIUM) 40 MG capsule Take 1 capsule (40 mg total) by mouth daily before breakfast. 90 capsule 4   HUMIRA PEN 40 MG/0.4ML PNKT Inject 40 mg into the muscle every 14 (fourteen) days.   1   hydrocortisone 2.5 % ointment Apply topically 2 (two) times daily. 60 g 3   lamoTRIgine (LAMICTAL) 200 MG tablet Take 1 tablet (200 mg total) by mouth daily. 60 tablet 0   methylphenidate (RITALIN) 10 MG tablet TAKE 1 TABLET BY MOUTH 3 TIMES A DAY WITH MEALS 90 tablet 0   methylphenidate 36 MG PO CR tablet Take 1 tablet (36 mg total) by mouth daily. 90 tablet 0   SYRINGE-NEEDLE, DISP, 3 ML (EASY TOUCH SAFETY SYRINGE) 22G X 1" 3 ML MISC As directed for IM injections 50 each 3   testosterone cypionate (DEPOTESTOSTERONE CYPIONATE) 200 MG/ML injection Inject 1 mL (200 mg total) into the muscle every 14 (fourteen) days. 10 mL 5   traZODone (DESYREL) 50 MG tablet 1-2 tablets at night for sleep 60 tablet 0   No current facility-administered medications for this visit.    Allergies  Allergen Reactions   Nsaids Other (See Comments)    Constipation   Subjective:  Patient presents for today's session.  Assessed progress.  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  He stated that he has turned in a biweekly task [place] to give his parents as requested to assure them that he is working on the house and making enough progress.  He is went on to share how his fast knowledge of this type of work surpasses theirs, which at times can be a barrier in terms of their fully understanding what work needs to be done and the time frame itself.  Ways he plans to try and continue to communicate with them effectively to express these facts, his feelings  was shared.  Interventions: Motivational interviewing, supportive therapy  Diagnoses:    ICD-10-CM   1. Bipolar affective disorder, rapid cycling (HCC)  F31.9        Plan: Patient is to use CBT, mindfulness and coping skills to help manage / decrease symptoms associated with their diagnosis.   Continue to take steps toward effective communication in his relationship with is parents to keep stress low.     Long-term goal:   Reduce overall level, frequency, and intensity of the feelings of depression, anxiety for at least 3 consecutive months per patient.   Short-term goal:  Verbally express understanding of the relationship between feelings of depression, anxiety and their impact on thinking patterns and behaviors. Verbalize an understanding of the role that distorted thinking plays in creating fears, excessive worry, and ruminations. Patient to utilize coping skills as discussed in session. Patient to identify and utilize effective communication skills with others       Patient to report any concerns with his medications to his prescribing provider as needed.  Progress: progressing   [name], [facility]

## 2023-06-05 ENCOUNTER — Telehealth: Payer: Self-pay | Admitting: Psychiatry

## 2023-06-05 NOTE — Telephone Encounter (Signed)
Pt lvm for a refill on his ritalin ritalin 10 mg 90 quantity. Pharmacy is [facility] on [address]

## 2023-06-05 NOTE — Telephone Encounter (Signed)
LF 10/14, due 11/11

## 2023-06-05 NOTE — Telephone Encounter (Signed)
Notified patient and he said he was aware.

## 2023-06-08 ENCOUNTER — Other Ambulatory Visit: Payer: Self-pay

## 2023-06-08 ENCOUNTER — Other Ambulatory Visit: Payer: Self-pay | Admitting: Psychiatry

## 2023-06-08 ENCOUNTER — Ambulatory Visit: Payer: BC Managed Care – PPO | Admitting: Internal Medicine

## 2023-06-08 ENCOUNTER — Encounter: Payer: Self-pay | Admitting: Internal Medicine

## 2023-06-08 VITALS — BP 120/82 | HR 86 | Temp 98.6°F | Ht 68.0 in

## 2023-06-08 DIAGNOSIS — E291 Testicular hypofunction: Secondary | ICD-10-CM

## 2023-06-08 DIAGNOSIS — F310 Bipolar disorder, current episode hypomanic: Secondary | ICD-10-CM

## 2023-06-08 DIAGNOSIS — F319 Bipolar disorder, unspecified: Secondary | ICD-10-CM

## 2023-06-08 DIAGNOSIS — E559 Vitamin D deficiency, unspecified: Secondary | ICD-10-CM

## 2023-06-08 DIAGNOSIS — L409 Psoriasis, unspecified: Secondary | ICD-10-CM | POA: Diagnosis not present

## 2023-06-08 DIAGNOSIS — F902 Attention-deficit hyperactivity disorder, combined type: Secondary | ICD-10-CM

## 2023-06-08 DIAGNOSIS — L4050 Arthropathic psoriasis, unspecified: Secondary | ICD-10-CM

## 2023-06-08 DIAGNOSIS — F909 Attention-deficit hyperactivity disorder, unspecified type: Secondary | ICD-10-CM

## 2023-06-08 MED ORDER — TESTOSTERONE CYPIONATE 200 MG/ML IM SOLN: 300.0000 mg | INTRAMUSCULAR | 5 refills | Status: DC

## 2023-06-08 MED ORDER — METHYLPHENIDATE HCL 10 MG PO TABS
ORAL_TABLET | ORAL | 0 refills | Status: DC
Start: 2023-06-08 — End: 2023-07-06

## 2023-06-08 NOTE — Telephone Encounter (Signed)
Pt called to check the status of this refill.

## 2023-06-08 NOTE — Telephone Encounter (Signed)
LF 08/14; LV 10/16;

## 2023-06-08 NOTE — Assessment & Plan Note (Signed)
On Concerta

## 2023-06-08 NOTE — Telephone Encounter (Signed)
Pended.

## 2023-06-08 NOTE — Assessment & Plan Note (Signed)
Psoriatic arthritis - on Humira

## 2023-06-08 NOTE — Assessment & Plan Note (Signed)
On Vit D 
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
## 2023-06-08 NOTE — Assessment & Plan Note (Addendum)
F/u w/Dr Jennelle Human Stable See meds on the med list

## 2023-06-17 DIAGNOSIS — M9905 Segmental and somatic dysfunction of pelvic region: Secondary | ICD-10-CM | POA: Diagnosis not present

## 2023-06-17 DIAGNOSIS — M9902 Segmental and somatic dysfunction of thoracic region: Secondary | ICD-10-CM | POA: Diagnosis not present

## 2023-06-17 DIAGNOSIS — M9903 Segmental and somatic dysfunction of lumbar region: Secondary | ICD-10-CM | POA: Diagnosis not present

## 2023-06-18 ENCOUNTER — Ambulatory Visit: Payer: BC Managed Care – PPO | Admitting: Mental Health

## 2023-06-18 DIAGNOSIS — F319 Bipolar disorder, unspecified: Secondary | ICD-10-CM | POA: Diagnosis not present

## 2023-06-18 NOTE — Progress Notes (Signed)
Psychotherapy Note  Name: Francisco Morgan Date:  06/18/23 MRN: [id number] DOB: 10-23-1975 PCP: Tresa Garter, MD  Time in: 1:00pm Time out:  1:51pm  Session time: 52 minutes  Treatment: Individual therapy  Mental Status Exam: Patient Appearance:    casual  Behavior:   appropriate  Motor:   WNL  Speech/Language:     Clear, coherent  Affect:   Full range  Mood:   euthymic  Thought process:   normal  Thought content:     WNL  Sensory/Perceptual disturbances:     WNL  Orientation:   x4  Attention:   Good  Concentration:   Good  Memory:   WNL  Fund of knowledge:    WNL  Insight:     good  Judgment:    good  Impulse Control:   good   Reported Symptoms:  Decreased sleep intermittently, some anxiety and irritability, ruminations  Risk Assessment: Danger to Self:  No Self-injurious Behavior: No Danger to Others: No Duty to Warn:no Physical Aggression / Violence:No  Access to Firearms a concern: No  Gang Involvement:No  Patient / guardian was educated about steps to take if suicide or homicide risk level increases between visits: yes While future psychiatric events cannot be accurately predicted, the patient does not currently require acute inpatient psychiatric care and does not currently meet [facility] involuntary commitment criteria.  Medications: Current Outpatient Medications  Medication Sig Dispense Refill   Acetylcysteine 600 MG CAPS Take 600 mg by mouth daily.     b complex vitamins tablet Take 1 tablet by mouth daily. 100 tablet 3   celecoxib (CELEBREX) 200 MG capsule Take 1 capsule (200 mg total) by mouth 2 (two) times daily. Annual appt due is due must see provider for future refills 60 capsule 5   Cholecalciferol (VITAMIN D) 2000 units CAPS 1 daily 100 capsule 3   clonazePAM (KLONOPIN) 0.5 MG tablet Take 1 tablet (0.5 mg total) by mouth 3 (three) times daily as needed. for anxiety 60 tablet 2   divalproex (DEPAKOTE) 500 MG DR tablet Take 5 tablets  (2,500 mg total) by mouth at bedtime. 450 tablet 0   esomeprazole (NEXIUM) 40 MG capsule Take 1 capsule (40 mg total) by mouth daily before breakfast. 90 capsule 4   HUMIRA PEN 40 MG/0.4ML PNKT Inject 40 mg into the muscle every 14 (fourteen) days.   1   hydrocortisone 2.5 % ointment Apply topically 2 (two) times daily. 60 g 3   lamoTRIgine (LAMICTAL) 200 MG tablet Take 1 tablet (200 mg total) by mouth daily. 60 tablet 0   methylphenidate (RITALIN) 10 MG tablet TAKE 1 TABLET BY MOUTH 3 TIMES A DAY WITH MEALS 90 tablet 0   methylphenidate 36 MG PO CR tablet Take 1 tablet (36 mg total) by mouth daily. 90 tablet 0   SYRINGE-NEEDLE, DISP, 3 ML (EASY TOUCH SAFETY SYRINGE) 22G X 1" 3 ML MISC As directed for IM injections 50 each 3   testosterone cypionate (DEPOTESTOSTERONE CYPIONATE) 200 MG/ML injection Inject 1.5 mLs (300 mg total) into the muscle every 14 (fourteen) days. 10 mL 5   traZODone (DESYREL) 50 MG tablet 1-2 tablets at night for sleep 60 tablet 0   No current facility-administered medications for this visit.    Allergies  Allergen Reactions   Nsaids Other (See Comments)    Constipation   Subjective:  Patient presents for today's session.  Assessed progress where patient continues to work on his family's renovation project, going  on to share how he continues to make progress.  Assess family relationships where he stated they have improved somewhat since last session, less disagreements which typically is centered around the progress he is making or lack thereof in his mother's view.  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  Assessed mood where he stated that he feels it has been stable, continues to take medications as prescribed.  Interventions: Motivational  interviewing, supportive therapy  Diagnoses:  No diagnosis found.    Plan: Patient is to use CBT, mindfulness and coping skills to help manage / decrease symptoms associated with their diagnosis.   Continue to take steps toward effective communication in his relationship with is parents to keep stress low.     Long-term goal:   Reduce overall level, frequency, and intensity of the feelings of depression, anxiety for at least 3 consecutive months per patient.   Short-term goal:  Verbally express understanding of the relationship between feelings of depression, anxiety and their impact on thinking patterns and behaviors. Verbalize an understanding of the role that distorted thinking plays in creating fears, excessive worry, and ruminations. Patient to utilize coping skills as discussed in session. Patient to identify and utilize effective communication skills with others       Patient to report any concerns with his medications to his prescribing provider as needed.  Progress: progressing   [name], [facility]

## 2023-06-22 DIAGNOSIS — M9905 Segmental and somatic dysfunction of pelvic region: Secondary | ICD-10-CM | POA: Diagnosis not present

## 2023-06-22 DIAGNOSIS — M9903 Segmental and somatic dysfunction of lumbar region: Secondary | ICD-10-CM | POA: Diagnosis not present

## 2023-06-22 DIAGNOSIS — M9902 Segmental and somatic dysfunction of thoracic region: Secondary | ICD-10-CM | POA: Diagnosis not present

## 2023-06-28 ENCOUNTER — Encounter: Payer: Self-pay | Admitting: Internal Medicine

## 2023-06-28 NOTE — Assessment & Plan Note (Signed)
Follow-up with Dr Dierdre Forth On Humira

## 2023-06-28 NOTE — Assessment & Plan Note (Signed)
Will try to get testosterone in the therapeutic range.  See med list.  It seems to be helping in general. Obtain lab work including free testosterone and 3 months  Potential benefits of a long term testosterone use as well as potential risks (BPH, MI, OSA, cancer)  and complications were explained to the patient and were aknowledged.

## 2023-06-29 ENCOUNTER — Encounter: Payer: Self-pay | Admitting: Internal Medicine

## 2023-06-29 DIAGNOSIS — M9905 Segmental and somatic dysfunction of pelvic region: Secondary | ICD-10-CM | POA: Diagnosis not present

## 2023-06-29 DIAGNOSIS — M9902 Segmental and somatic dysfunction of thoracic region: Secondary | ICD-10-CM | POA: Diagnosis not present

## 2023-06-29 DIAGNOSIS — M9903 Segmental and somatic dysfunction of lumbar region: Secondary | ICD-10-CM | POA: Diagnosis not present

## 2023-06-29 NOTE — Telephone Encounter (Signed)
Patient called back and said that it needs to be a 90 day prescription in the form of a 10 ml vial. - 300 mg. Of medication every 2 weeks.

## 2023-07-01 ENCOUNTER — Other Ambulatory Visit: Payer: Self-pay | Admitting: Psychiatry

## 2023-07-01 ENCOUNTER — Other Ambulatory Visit: Payer: Self-pay | Admitting: Internal Medicine

## 2023-07-01 DIAGNOSIS — F5105 Insomnia due to other mental disorder: Secondary | ICD-10-CM

## 2023-07-02 ENCOUNTER — Ambulatory Visit: Payer: BC Managed Care – PPO | Admitting: Mental Health

## 2023-07-02 DIAGNOSIS — M9902 Segmental and somatic dysfunction of thoracic region: Secondary | ICD-10-CM | POA: Diagnosis not present

## 2023-07-02 DIAGNOSIS — M9905 Segmental and somatic dysfunction of pelvic region: Secondary | ICD-10-CM | POA: Diagnosis not present

## 2023-07-02 DIAGNOSIS — F319 Bipolar disorder, unspecified: Secondary | ICD-10-CM

## 2023-07-02 DIAGNOSIS — L400 Psoriasis vulgaris: Secondary | ICD-10-CM | POA: Diagnosis not present

## 2023-07-02 DIAGNOSIS — L4059 Other psoriatic arthropathy: Secondary | ICD-10-CM | POA: Diagnosis not present

## 2023-07-02 DIAGNOSIS — M1A09X0 Idiopathic chronic gout, multiple sites, without tophus (tophi): Secondary | ICD-10-CM | POA: Diagnosis not present

## 2023-07-02 DIAGNOSIS — Z1589 Genetic susceptibility to other disease: Secondary | ICD-10-CM | POA: Diagnosis not present

## 2023-07-02 DIAGNOSIS — M9903 Segmental and somatic dysfunction of lumbar region: Secondary | ICD-10-CM | POA: Diagnosis not present

## 2023-07-03 ENCOUNTER — Telehealth: Payer: Self-pay | Admitting: Psychiatry

## 2023-07-03 ENCOUNTER — Other Ambulatory Visit (HOSPITAL_COMMUNITY): Payer: Self-pay

## 2023-07-03 ENCOUNTER — Telehealth: Payer: Self-pay

## 2023-07-03 ENCOUNTER — Other Ambulatory Visit: Payer: Self-pay | Admitting: Internal Medicine

## 2023-07-03 NOTE — Telephone Encounter (Signed)
Pharmacy Patient Advocate Encounter   Received notification from Patient Advice Request messages that prior authorization for Testosterone Cypionate 200MG /ML intramuscular solution is required/requested.   Insurance verification completed.   The patient is insured through [facility] .   Per test claim: PA required; PA submitted to above mentioned insurance via CoverMyMeds Key/confirmation #/EOC [facility] Status is pending

## 2023-07-03 NOTE — Telephone Encounter (Signed)
Pt called at 10:28 requesting refill of Methylphenidate 10mg  for 90 days and Methylphenidate 36mg   for 30 days sent to  [id number] [place], [facility] - [address] [place], [place] [address] Phone: [phone]  Fax: [phone]    Next appt 12/17

## 2023-07-03 NOTE — Telephone Encounter (Signed)
Additional information has been requested from the patient's insurance in order to proceed with the prior authorization request. Requested information has been sent, or form has been filled out and faxed back to [phone]

## 2023-07-03 NOTE — Telephone Encounter (Signed)
Methylphenidate LF 11/11, due 12/9 Methylphenidate 36 mg, last filled for 90 days 9/16, due 12/13

## 2023-07-06 ENCOUNTER — Other Ambulatory Visit (HOSPITAL_COMMUNITY): Payer: Self-pay

## 2023-07-06 ENCOUNTER — Other Ambulatory Visit: Payer: Self-pay | Admitting: Psychiatry

## 2023-07-06 ENCOUNTER — Other Ambulatory Visit: Payer: Self-pay

## 2023-07-06 ENCOUNTER — Ambulatory Visit (INDEPENDENT_AMBULATORY_CARE_PROVIDER_SITE_OTHER): Payer: BC Managed Care – PPO | Admitting: Orthopedic Surgery

## 2023-07-06 DIAGNOSIS — F902 Attention-deficit hyperactivity disorder, combined type: Secondary | ICD-10-CM

## 2023-07-06 DIAGNOSIS — M9902 Segmental and somatic dysfunction of thoracic region: Secondary | ICD-10-CM | POA: Diagnosis not present

## 2023-07-06 DIAGNOSIS — M9903 Segmental and somatic dysfunction of lumbar region: Secondary | ICD-10-CM | POA: Diagnosis not present

## 2023-07-06 DIAGNOSIS — M25871 Other specified joint disorders, right ankle and foot: Secondary | ICD-10-CM

## 2023-07-06 DIAGNOSIS — M9905 Segmental and somatic dysfunction of pelvic region: Secondary | ICD-10-CM | POA: Diagnosis not present

## 2023-07-06 MED ORDER — METHYLPHENIDATE HCL ER (OSM) 36 MG PO TBCR: 36.0000 mg | EXTENDED_RELEASE_TABLET | Freq: Every day | ORAL | 0 refills | Status: DC

## 2023-07-06 MED ORDER — METHYLPHENIDATE HCL 10 MG PO TABS: ORAL_TABLET | ORAL | 0 refills | Status: DC

## 2023-07-06 NOTE — Telephone Encounter (Signed)
Pharmacy Patient Advocate Encounter  Received notification from [facility] that Prior Authorization for Testosterone Cypionate 200MG /ML intramuscular solution  has been APPROVED from 07/03/23 to 07/02/24. Ran test claim, Copay is $0. This test claim was processed through [facility]- copay amounts may vary at other pharmacies due to pharmacy/plan contracts, or as the patient moves through the different stages of their insurance plan.   PA #/Case ID/Reference #: [id number]

## 2023-07-06 NOTE — Telephone Encounter (Signed)
[facility]

## 2023-07-06 NOTE — Progress Notes (Signed)
Psychotherapy Note  Name: Francisco Morgan Date:  07/02/23 MRN: [id number] DOB: 1976/05/20 PCP: Tresa Garter, MD  Time in: 1:00pm Time out:  1:50pm  Session time: 50 minutes  Treatment: Individual therapy  Mental Status Exam: Patient Appearance:    casual  Behavior:   appropriate  Motor:   WNL  Speech/Language:     Clear, coherent  Affect:   Full range  Mood:   euthymic  Thought process:   normal  Thought content:     WNL  Sensory/Perceptual disturbances:     WNL  Orientation:   x4  Attention:   Good  Concentration:   Good  Memory:   WNL  Fund of knowledge:    WNL  Insight:     good  Judgment:    good  Impulse Control:   good   Reported Symptoms:  Decreased sleep intermittently, some anxiety and irritability, ruminations  Risk Assessment: Danger to Self:  No Self-injurious Behavior: No Danger to Others: No Duty to Warn:no Physical Aggression / Violence:No  Access to Firearms a concern: No  Gang Involvement:No  Patient / guardian was educated about steps to take if suicide or homicide risk level increases between visits: yes While future psychiatric events cannot be accurately predicted, the patient does not currently require acute inpatient psychiatric care and does not currently meet [facility] involuntary commitment criteria.  Medications: Current Outpatient Medications  Medication Sig Dispense Refill   Acetylcysteine 600 MG CAPS Take 600 mg by mouth daily.     b complex vitamins tablet Take 1 tablet by mouth daily. 100 tablet 3   celecoxib (CELEBREX) 200 MG capsule Take 1 capsule (200 mg total) by mouth 2 (two) times daily. Annual appt due is due must see provider for future refills 60 capsule 5   Cholecalciferol (VITAMIN D) 2000 units CAPS 1 daily 100 capsule 3   clonazePAM (KLONOPIN) 0.5 MG tablet Take 1 tablet (0.5 mg total) by mouth 3 (three) times daily as needed. for anxiety 60 tablet 2   divalproex (DEPAKOTE) 500 MG DR tablet Take 5 tablets  (2,500 mg total) by mouth at bedtime. 450 tablet 0   esomeprazole (NEXIUM) 40 MG capsule Take 1 capsule (40 mg total) by mouth daily before breakfast. 90 capsule 4   HUMIRA PEN 40 MG/0.4ML PNKT Inject 40 mg into the muscle every 14 (fourteen) days.   1   hydrocortisone 2.5 % ointment Apply topically 2 (two) times daily. 60 g 3   lamoTRIgine (LAMICTAL) 200 MG tablet Take 1 tablet (200 mg total) by mouth daily. 60 tablet 0   methylphenidate (RITALIN) 10 MG tablet TAKE 1 TABLET BY MOUTH 3 TIMES A DAY WITH MEALS 90 tablet 0   methylphenidate 36 MG PO CR tablet Take 1 tablet (36 mg total) by mouth daily. 90 tablet 0   SYRINGE-NEEDLE, DISP, 3 ML (EASY TOUCH SAFETY SYRINGE) 22G X 1" 3 ML MISC As directed for IM injections 50 each 3   testosterone cypionate (DEPOTESTOSTERONE CYPIONATE) 200 MG/ML injection Inject 1.5 mLs (300 mg total) into the muscle every 14 (fourteen) days. 10 mL 5   traZODone (DESYREL) 50 MG tablet TAKE 1 TO 2 TABLETS BY MOUTH at night for sleep 60 tablet 0   No current facility-administered medications for this visit.    Allergies  Allergen Reactions   Nsaids Other (See Comments)    Constipation   Subjective:  Patient presents for today's session.  Assessed progress for patient spends most of his  time working on the renovation project in collaboration with his parents.  Explored recent family interactions, reports some ongoing stress primarily in the relationship with his mother and progress he is making on the home project.  Patient further identified recent [place] events that led him to feel some increased stress.  Explored concerns with patient as well as provided support and space for patient to further identify feelings related.  Provide support and encouragement toward his continuing to make progress, And care for himself regarding getting adequate rest and continuing to be mindful of communication with his parents, setting interpersonal boundaries as needed.  Interventions:  Motivational interviewing, supportive therapy  Diagnoses:    ICD-10-CM   1. Bipolar affective disorder, rapid cycling (HCC)  F31.9         Plan: Patient is to use CBT, mindfulness and coping skills to help manage / decrease symptoms associated with their diagnosis.   Continue to take steps toward effective communication in his relationship with is parents to keep stress low.     Long-term goal:   Reduce overall level, frequency, and intensity of the feelings of depression, anxiety for at least 3 consecutive months per patient.   Short-term goal:  Verbally express understanding of the relationship between feelings of depression, anxiety and their impact on thinking patterns and behaviors. Verbalize an understanding of the role that distorted thinking plays in creating fears, excessive worry, and ruminations. Patient to utilize coping skills as discussed in session. Patient to identify and utilize effective communication skills with others       Patient to report any concerns with his medications to his prescribing provider as needed.  Progress: progressing   [name], [facility]

## 2023-07-07 ENCOUNTER — Encounter: Payer: Self-pay | Admitting: Orthopedic Surgery

## 2023-07-07 DIAGNOSIS — M25871 Other specified joint disorders, right ankle and foot: Secondary | ICD-10-CM

## 2023-07-07 MED ORDER — METHYLPREDNISOLONE ACETATE 40 MG/ML IJ SUSP
40.0000 mg | INTRAMUSCULAR | Status: AC | PRN
  Administered 2023-07-07: 40 mg via INTRA_ARTICULAR

## 2023-07-07 MED ORDER — LIDOCAINE HCL 1 % IJ SOLN
2.0000 mL | INTRAMUSCULAR | Status: AC | PRN
  Administered 2023-07-07: 2 mL
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
## 2023-07-08 ENCOUNTER — Telehealth: Payer: Self-pay | Admitting: Internal Medicine

## 2023-07-08 NOTE — Telephone Encounter (Signed)
Prescription Request  07/08/2023  LOV: 06/08/2023  What is the name of the medication or equipment? testosterone cypionate (DEPOTESTOSTERONE CYPIONATE) 200 MG/ML injection   Have you contacted your pharmacy to request a refill? No   Which pharmacy would you like this sent to?  [facility]   [address] [place], [place], [place] [address] [phone]  Patient notified that their request is being sent to the clinical staff for review and that they should receive a response within 2 business days.   Please advise at Mobile [phone] (mobile)

## 2023-07-09 ENCOUNTER — Other Ambulatory Visit: Payer: Self-pay | Admitting: Internal Medicine

## 2023-07-09 DIAGNOSIS — M9905 Segmental and somatic dysfunction of pelvic region: Secondary | ICD-10-CM | POA: Diagnosis not present

## 2023-07-09 DIAGNOSIS — M9902 Segmental and somatic dysfunction of thoracic region: Secondary | ICD-10-CM | POA: Diagnosis not present

## 2023-07-09 DIAGNOSIS — M9903 Segmental and somatic dysfunction of lumbar region: Secondary | ICD-10-CM | POA: Diagnosis not present

## 2023-07-09 MED ORDER — TESTOSTERONE CYPIONATE 200 MG/ML IM SOLN: 300.0000 mg | INTRAMUSCULAR | 5 refills | Status: DC

## 2023-07-11 ENCOUNTER — Other Ambulatory Visit: Payer: Self-pay | Admitting: Psychiatry

## 2023-07-11 DIAGNOSIS — F4310 Post-traumatic stress disorder, unspecified: Secondary | ICD-10-CM

## 2023-07-11 DIAGNOSIS — F319 Bipolar disorder, unspecified: Secondary | ICD-10-CM

## 2023-07-12 NOTE — Telephone Encounter (Signed)
It has been sent on 07/09/2023.  Thank you

## 2023-07-13 ENCOUNTER — Telehealth: Payer: Self-pay | Admitting: Psychiatry

## 2023-07-13 ENCOUNTER — Other Ambulatory Visit: Payer: Self-pay

## 2023-07-13 DIAGNOSIS — F902 Attention-deficit hyperactivity disorder, combined type: Secondary | ICD-10-CM

## 2023-07-13 MED ORDER — METHYLPHENIDATE HCL ER (OSM) 36 MG PO TBCR: 36.0000 mg | EXTENDED_RELEASE_TABLET | Freq: Every day | ORAL | 0 refills | Status: DC

## 2023-07-13 NOTE — Telephone Encounter (Signed)
Pt called over weekend and LM . He said he called earlier in the week to ask for his Methylphenidate and Concerta (2) Rxs and should be ready at pharmacy since they are due on the 13th. He said they are not there and he couldn't get them filled and he is out.

## 2023-07-13 NOTE — Telephone Encounter (Signed)
Had been sent to [facility] on 12/9. This is not the requested pharmacy. Canceled at [facility] and [facility] [address] Patient filled 10 mg on 12/9.

## 2023-07-14 ENCOUNTER — Ambulatory Visit: Payer: BC Managed Care – PPO | Admitting: Psychiatry

## 2023-07-14 DIAGNOSIS — M9902 Segmental and somatic dysfunction of thoracic region: Secondary | ICD-10-CM | POA: Diagnosis not present

## 2023-07-14 DIAGNOSIS — M9905 Segmental and somatic dysfunction of pelvic region: Secondary | ICD-10-CM | POA: Diagnosis not present

## 2023-07-14 DIAGNOSIS — M9903 Segmental and somatic dysfunction of lumbar region: Secondary | ICD-10-CM | POA: Diagnosis not present

## 2023-07-16 ENCOUNTER — Ambulatory Visit: Payer: BC Managed Care – PPO | Admitting: Mental Health

## 2023-07-16 NOTE — Progress Notes (Unsigned)
Psychotherapy Note  Name: Francisco Morgan Date:  07/16/23 MRN: [id number] DOB: 09/10/1975 PCP: Tresa Garter, MD  Time in: 1:04pm Time out:  1:55pm  Session time: 51 minutes  Treatment: Individual therapy  Mental Status Exam: Patient Appearance:    casual  Behavior:   appropriate  Motor:   WNL  Speech/Language:     Clear, coherent  Affect:   Full range  Mood:   euthymic  Thought process:   normal  Thought content:     WNL  Sensory/Perceptual disturbances:     WNL  Orientation:   x4  Attention:   Good  Concentration:   Good  Memory:   WNL  Fund of knowledge:    WNL  Insight:     good  Judgment:    good  Impulse Control:   good   Reported Symptoms:  Decreased sleep intermittently, some anxiety and irritability, ruminations  Risk Assessment: Danger to Self:  No Self-injurious Behavior: No Danger to Others: No Duty to Warn:no Physical Aggression / Violence:No  Access to Firearms a concern: No  Gang Involvement:No  Patient / guardian was educated about steps to take if suicide or homicide risk level increases between visits: yes While future psychiatric events cannot be accurately predicted, the patient does not currently require acute inpatient psychiatric care and does not currently meet [facility] involuntary commitment criteria.  Medications: Current Outpatient Medications  Medication Sig Dispense Refill   Acetylcysteine 600 MG CAPS Take 600 mg by mouth daily.     b complex vitamins tablet Take 1 tablet by mouth daily. 100 tablet 3   celecoxib (CELEBREX) 200 MG capsule Take 1 capsule (200 mg total) by mouth 2 (two) times daily. Annual appt due is due must see provider for future refills 60 capsule 5   Cholecalciferol (VITAMIN D) 2000 units CAPS 1 daily 100 capsule 3   clonazePAM (KLONOPIN) 0.5 MG tablet Take 1 tablet (0.5 mg total) by mouth 3 (three) times daily as needed. for anxiety 60 tablet 2   divalproex (DEPAKOTE) 500 MG DR tablet Take 5 tablets  (2,500 mg total) by mouth at bedtime. 450 tablet 0   esomeprazole (NEXIUM) 40 MG capsule Take 1 capsule (40 mg total) by mouth daily before breakfast. 90 capsule 4   HUMIRA PEN 40 MG/0.4ML PNKT Inject 40 mg into the muscle every 14 (fourteen) days.   1   hydrocortisone 2.5 % ointment Apply topically 2 (two) times daily. 60 g 3   lamoTRIgine (LAMICTAL) 200 MG tablet Take 1 tablet (200 mg total) by mouth daily. 60 tablet 0   [START ON 08/03/2023] methylphenidate (RITALIN) 10 MG tablet TAKE 1 TABLET BY MOUTH 3 TIMES A DAY WITH MEALS 90 tablet 0   methylphenidate 36 MG PO CR tablet Take 1 tablet (36 mg total) by mouth daily. 90 tablet 0   SYRINGE-NEEDLE, DISP, 3 ML (EASY TOUCH SAFETY SYRINGE) 22G X 1" 3 ML MISC As directed for IM injections 50 each 3   testosterone cypionate (DEPOTESTOSTERONE CYPIONATE) 200 MG/ML injection Inject 1.5 mLs (300 mg total) into the muscle every 14 (fourteen) days. 10 mL 5   traZODone (DESYREL) 50 MG tablet TAKE 1 TO 2 TABLETS BY MOUTH at night for sleep 60 tablet 0   No current facility-administered medications for this visit.    Allergies  Allergen Reactions   Nsaids Other (See Comments)    Constipation   Subjective:  Patient presents for today's session.  Assessed progress for patient spends  most of his time working on the renovation project in collaboration with his parents.  Explored recent family interactions, reports some ongoing stress primarily in the relationship with his mother and progress he is making on the home project.  Patient further identified recent [employer] events that led him to feel some increased stress.  Explored concerns with patient as well as provided support and space for patient to further identify feelings related.  Provide support and encouragement toward his continuing to make progress, And care for himself regarding getting adequate rest and continuing to be mindful of communication with his parents, setting interpersonal boundaries as  needed.  Interventions: Motivational interviewing, supportive therapy  Diagnoses:  No diagnosis found.     Plan: Patient is to use CBT, mindfulness and coping skills to help manage / decrease symptoms associated with their diagnosis.   Continue to take steps toward effective communication in his relationship with is parents to keep stress low.     Long-term goal:   Reduce overall level, frequency, and intensity of the feelings of depression, anxiety for at least 3 consecutive months per patient.   Short-term goal:  Verbally express understanding of the relationship between feelings of depression, anxiety and their impact on thinking patterns and behaviors. Verbalize an understanding of the role that distorted thinking plays in creating fears, excessive worry, and ruminations. Patient to utilize coping skills as discussed in session. Patient to identify and utilize effective communication skills with others       Patient to report any concerns with his medications to his prescribing provider as needed.  Progress: progressing   Francisco Morgan Session, [facility]

## 2023-07-24 ENCOUNTER — Ambulatory Visit (INDEPENDENT_AMBULATORY_CARE_PROVIDER_SITE_OTHER): Payer: BC Managed Care – PPO | Admitting: Psychiatry

## 2023-07-24 ENCOUNTER — Encounter: Payer: Self-pay | Admitting: Psychiatry

## 2023-07-24 DIAGNOSIS — F5105 Insomnia due to other mental disorder: Secondary | ICD-10-CM | POA: Diagnosis not present

## 2023-07-24 DIAGNOSIS — F902 Attention-deficit hyperactivity disorder, combined type: Secondary | ICD-10-CM | POA: Diagnosis not present

## 2023-07-24 DIAGNOSIS — F319 Bipolar disorder, unspecified: Secondary | ICD-10-CM | POA: Diagnosis not present

## 2023-07-24 DIAGNOSIS — F4310 Post-traumatic stress disorder, unspecified: Secondary | ICD-10-CM | POA: Diagnosis not present
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
## 2023-08-04 ENCOUNTER — Other Ambulatory Visit: Payer: Self-pay | Admitting: Psychiatry

## 2023-08-04 DIAGNOSIS — F310 Bipolar disorder, current episode hypomanic: Secondary | ICD-10-CM

## 2023-08-05 ENCOUNTER — Telehealth: Payer: Self-pay | Admitting: Psychiatry

## 2023-08-05 ENCOUNTER — Other Ambulatory Visit: Payer: Self-pay

## 2023-08-05 ENCOUNTER — Other Ambulatory Visit: Payer: Self-pay | Admitting: Psychiatry

## 2023-08-05 ENCOUNTER — Encounter: Payer: Self-pay | Admitting: Internal Medicine

## 2023-08-05 DIAGNOSIS — F902 Attention-deficit hyperactivity disorder, combined type: Secondary | ICD-10-CM

## 2023-08-05 NOTE — Telephone Encounter (Signed)
 Pt called and would like a refill on his ritalin  10 mg. Pharmacy is [facility] on [address]. He said [facility] has never had stimulants. Please take [facility] out. Pt is out

## 2023-08-05 NOTE — Telephone Encounter (Signed)
 Called [place] to cancel and they said patient already picked up the medication. LVM to [facility] for patient.

## 2023-08-05 NOTE — Telephone Encounter (Signed)
 I checked PDMP and it says he picked up both stimulants for 90 day supply in Dec.  So it doesn't look like he needs anything now

## 2023-08-06 ENCOUNTER — Ambulatory Visit: Payer: BC Managed Care – PPO | Admitting: Mental Health

## 2023-08-06 DIAGNOSIS — F319 Bipolar disorder, unspecified: Secondary | ICD-10-CM

## 2023-08-06 NOTE — Progress Notes (Signed)
 Psychotherapy Note  Name: Francisco Morgan Date:  08/06/23 MRN: [id number] DOB: [birth year] PCP: Garald Karlynn GAILS, MD  Time in: 1:01pm Time out:  1:51pm  Session time: 50 minutes  Treatment: Individual therapy  Mental Status Exam: Patient Appearance:    casual  Behavior:   appropriate  Motor:   WNL  Speech/Language:     Clear, coherent  Affect:   Full range  Mood:   euthymic  Thought process:   normal  Thought content:     WNL  Sensory/Perceptual disturbances:     WNL  Orientation:   x4  Attention:   Good  Concentration:   Good  Memory:   WNL  Fund of knowledge:    WNL  Insight:     good  Judgment:    good  Impulse Control:   good   Reported Symptoms:  Decreased sleep intermittently, some anxiety and irritability, ruminations  Risk Assessment: Danger to Self:  No Self-injurious Behavior: No Danger to Others: No Duty to Warn:no Physical Aggression / Violence:No  Access to Firearms a concern: No  Gang Involvement:No  Patient / guardian was educated about steps to take if suicide or homicide risk level increases between visits: yes While future psychiatric events cannot be accurately predicted, the patient does not currently require acute inpatient psychiatric care and does not currently meet [facility]  involuntary commitment criteria.  Medications: Current Outpatient Medications  Medication Sig Dispense Refill   Acetylcysteine 600 MG CAPS Take 600 mg by mouth daily.     b complex vitamins tablet Take 1 tablet by mouth daily. 100 tablet 3   celecoxib  (CELEBREX ) 200 MG capsule Take 1 capsule (200 mg total) by mouth 2 (two) times daily. Annual appt due is due must see provider for future refills 60 capsule 5   Cholecalciferol (VITAMIN D ) 2000 units CAPS 1 daily 100 capsule 3   clonazePAM  (KLONOPIN ) 0.5 MG tablet Take 1 tablet (0.5 mg total) by mouth 3 (three) times daily as needed. for anxiety 60 tablet 2   divalproex  (DEPAKOTE ) 500 MG DR tablet Take 5 tablets  (2,500 mg total) by mouth at bedtime. 450 tablet 0   esomeprazole  (NEXIUM ) 40 MG capsule Take 1 capsule (40 mg total) by mouth daily before breakfast. 90 capsule 4   HUMIRA PEN 40 MG/0.4ML PNKT Inject 40 mg into the muscle every 14 (fourteen) days.   1   hydrocortisone  2.5 % ointment Apply topically 2 (two) times daily. 60 g 3   lamoTRIgine  (LAMICTAL ) 200 MG tablet Take 1 tablet (200 mg total) by mouth daily. 90 tablet 0   methylphenidate  (RITALIN ) 10 MG tablet TAKE 1 TABLET BY MOUTH 3 TIMES A DAY WITH MEALS 90 tablet 0   methylphenidate  36 MG PO CR tablet Take 1 tablet (36 mg total) by mouth daily. 90 tablet 0   SYRINGE-NEEDLE, DISP, 3 ML (EASY TOUCH SAFETY SYRINGE) 22G X 1 3 ML MISC As directed for IM injections 50 each 3   testosterone  cypionate (DEPOTESTOSTERONE CYPIONATE) 200 MG/ML injection Inject 1.5 mLs (300 mg total) into the muscle every 14 (fourteen) days. 10 mL 5   traZODone  (DESYREL ) 50 MG tablet TAKE 1 TO 2 TABLETS BY MOUTH at night for sleep 60 tablet 0   No current facility-administered medications for this visit.    Allergies  Allergen Reactions   Nsaids Other (See Comments)    Constipation   Subjective:  Patient presents for today's session.  Assessed recent events, progress.  Patient shared how  they continue to work with her parents motivation property.  Shared briefly interactions with extended family over the holiday, at this point identifying the wants to talk to his sister indefinitely.  We plan to further discuss next session per patient request. He went on to share past experiences with others, past romantic relationships and social involvement with friends, past employment engineer, building services.  Through further guided discovery, patient continues to think about anything make friendships..  He stated that the challenge of going to some of the concerts and making friends due to age differences.  Encouraged him to continue to seek out possible ways to meet others in the  area.  Interventions: Motivational interviewing, supportive therapy  Diagnoses:    ICD-10-CM   1. Bipolar affective disorder, rapid cycling (HCC)  F31.9          Plan: Patient is to use CBT, mindfulness and coping skills to help manage / decrease symptoms associated with their diagnosis.   Continue to take steps toward effective communication in his relationship with is parents to keep stress low.     Long-term goal:   Reduce overall level, frequency, and intensity of the feelings of depression, anxiety for at least 3 consecutive months per patient.   Short-term goal:  Verbally express understanding of the relationship between feelings of depression, anxiety and their impact on thinking patterns and behaviors. Verbalize an understanding of the role that distorted thinking plays in creating fears, excessive worry, and ruminations. Patient to utilize coping skills as discussed in session. Patient to identify and utilize effective communication skills with others       Patient to report any concerns with his medications to his prescribing provider as needed.  Progress: progressing   Lonni Fischer, [facility]

## 2023-08-07 ENCOUNTER — Other Ambulatory Visit: Payer: Self-pay | Admitting: Internal Medicine

## 2023-08-07 MED ORDER — TESTOSTERONE CYPIONATE 200 MG/ML IM SOLN: 300.0000 mg | INTRAMUSCULAR | 5 refills | Status: DC

## 2023-08-31 ENCOUNTER — Telehealth: Payer: Self-pay | Admitting: Psychiatry

## 2023-08-31 NOTE — Telephone Encounter (Signed)
Pt lvm that he needs his ritalin  10 mg quantity of 90 to be sent to [facility]. He also said that he has lost his bottle of lamotrigine. So he needs a new script sent in

## 2023-08-31 NOTE — Telephone Encounter (Signed)
Lf 1/8 due 2/5

## 2023-09-01 ENCOUNTER — Other Ambulatory Visit: Payer: Self-pay | Admitting: Psychiatry

## 2023-09-01 DIAGNOSIS — F5105 Insomnia due to other mental disorder: Secondary | ICD-10-CM

## 2023-09-01 DIAGNOSIS — F902 Attention-deficit hyperactivity disorder, combined type: Secondary | ICD-10-CM

## 2023-09-01 DIAGNOSIS — F310 Bipolar disorder, current episode hypomanic: Secondary | ICD-10-CM

## 2023-09-01 NOTE — Telephone Encounter (Signed)
Lf 1/8 due 2/5 changed date to tomorrow

## 2023-09-17 ENCOUNTER — Ambulatory Visit: Payer: BC Managed Care – PPO | Admitting: Mental Health

## 2023-09-21 ENCOUNTER — Other Ambulatory Visit: Payer: Self-pay | Admitting: Internal Medicine

## 2023-10-01 ENCOUNTER — Other Ambulatory Visit: Payer: Self-pay

## 2023-10-01 ENCOUNTER — Telehealth: Payer: Self-pay | Admitting: Psychiatry

## 2023-10-01 ENCOUNTER — Ambulatory Visit: Payer: BC Managed Care – PPO | Admitting: Mental Health

## 2023-10-01 DIAGNOSIS — F319 Bipolar disorder, unspecified: Secondary | ICD-10-CM

## 2023-10-01 DIAGNOSIS — F902 Attention-deficit hyperactivity disorder, combined type: Secondary | ICD-10-CM

## 2023-10-01 MED ORDER — METHYLPHENIDATE HCL 10 MG PO TABS: 10.0000 mg | ORAL_TABLET | Freq: Three times a day (TID) | ORAL | 0 refills | Status: DC

## 2023-10-01 NOTE — Telephone Encounter (Signed)
 Pt called at 9:10a requesting refill of Methylphenidate 10mg  to   [facility] [place], [place] - [address] [place] [address] Phone: [phone]  Fax: [phone]   Next appt 4/1
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
## 2023-10-01 NOTE — Telephone Encounter (Signed)
 Pended methylphenidate 10 mg, #90, to [facility].

## 2023-10-07 ENCOUNTER — Telehealth: Payer: Self-pay | Admitting: Psychiatry

## 2023-10-07 DIAGNOSIS — F902 Attention-deficit hyperactivity disorder, combined type: Secondary | ICD-10-CM

## 2023-10-07 NOTE — Telephone Encounter (Signed)
 Pt called 8:55 am requesting Rx for methylphenidate 36 mg ER #30 to [facility]. Apt 4/1

## 2023-10-07 NOTE — Telephone Encounter (Signed)
 LF for 90 days on 12/16, due 3/14.

## 2023-10-09 MED ORDER — METHYLPHENIDATE HCL ER (OSM) 36 MG PO TBCR: 36.0000 mg | EXTENDED_RELEASE_TABLET | Freq: Every day | ORAL | 0 refills | Status: DC

## 2023-10-15 ENCOUNTER — Ambulatory Visit: Payer: BC Managed Care – PPO | Admitting: Mental Health

## 2023-10-15 DIAGNOSIS — F319 Bipolar disorder, unspecified: Secondary | ICD-10-CM | POA: Diagnosis not present
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
## 2023-10-20 ENCOUNTER — Telehealth: Payer: Self-pay | Admitting: Psychiatry
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
## 2023-10-20 NOTE — Telephone Encounter (Signed)
 Please see msg from Dr.  Debby Bud.
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
## 2023-10-27 ENCOUNTER — Encounter: Payer: Self-pay | Admitting: Psychiatry

## 2023-10-27 ENCOUNTER — Ambulatory Visit (INDEPENDENT_AMBULATORY_CARE_PROVIDER_SITE_OTHER): Payer: BC Managed Care – PPO | Admitting: Psychiatry

## 2023-10-27 DIAGNOSIS — F4310 Post-traumatic stress disorder, unspecified: Secondary | ICD-10-CM

## 2023-10-27 DIAGNOSIS — F5105 Insomnia due to other mental disorder: Secondary | ICD-10-CM

## 2023-10-27 DIAGNOSIS — F902 Attention-deficit hyperactivity disorder, combined type: Secondary | ICD-10-CM | POA: Diagnosis not present

## 2023-10-27 DIAGNOSIS — F310 Bipolar disorder, current episode hypomanic: Secondary | ICD-10-CM

## 2023-10-27 DIAGNOSIS — F319 Bipolar disorder, unspecified: Secondary | ICD-10-CM
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
## 2023-10-28 ENCOUNTER — Encounter: Payer: Self-pay | Admitting: Psychiatry

## 2023-10-28 ENCOUNTER — Other Ambulatory Visit: Payer: Self-pay | Admitting: Psychiatry

## 2023-10-28 DIAGNOSIS — F310 Bipolar disorder, current episode hypomanic: Secondary | ICD-10-CM

## 2023-10-28 DIAGNOSIS — F902 Attention-deficit hyperactivity disorder, combined type: Secondary | ICD-10-CM

## 2023-10-28 MED ORDER — CLONAZEPAM 0.5 MG PO TABS: 0.5000 mg | ORAL_TABLET | Freq: Three times a day (TID) | ORAL | 2 refills | Status: DC | PRN

## 2023-10-28 MED ORDER — METHYLPHENIDATE HCL ER (OSM) 36 MG PO TBCR: 36.0000 mg | EXTENDED_RELEASE_TABLET | Freq: Every day | ORAL | 0 refills | Status: DC

## 2023-10-28 MED ORDER — METHYLPHENIDATE HCL 10 MG PO TABS: 10.0000 mg | ORAL_TABLET | Freq: Three times a day (TID) | ORAL | 0 refills | Status: DC

## 2023-10-28 MED ORDER — LAMOTRIGINE 200 MG PO TABS: 200.0000 mg | ORAL_TABLET | Freq: Every day | ORAL | 0 refills | Status: DC

## 2023-10-28 MED ORDER — DIVALPROEX SODIUM 500 MG PO DR TAB: 2500.0000 mg | DELAYED_RELEASE_TABLET | Freq: Every day | ORAL | 0 refills | Status: DC

## 2023-10-29 ENCOUNTER — Ambulatory Visit (INDEPENDENT_AMBULATORY_CARE_PROVIDER_SITE_OTHER): Payer: BC Managed Care – PPO | Admitting: Mental Health

## 2023-10-29 ENCOUNTER — Other Ambulatory Visit: Payer: Self-pay | Admitting: Psychiatry

## 2023-10-29 DIAGNOSIS — F310 Bipolar disorder, current episode hypomanic: Secondary | ICD-10-CM | POA: Diagnosis not present

## 2023-10-29 MED ORDER — LAMOTRIGINE 200 MG PO TABS: 200.0000 mg | ORAL_TABLET | Freq: Every day | ORAL | 0 refills | Status: DC

## 2023-10-29 NOTE — Telephone Encounter (Signed)
 Pharmacy changed to [facility]

## 2023-10-29 NOTE — Progress Notes (Unsigned)
 Psychotherapy Note  Name: Francisco Morgan Date:  10/29/23   MRN: [id number] DOB: [birth year] PCP: Tresa Garter, MD  Time in: 1:10pm Time out:  1:55pm  Session time:  45 minutes  Treatment: Individual therapy  Mental Status Exam: Patient Appearance:    casual  Behavior:   appropriate  Motor:   WNL  Speech/Language:     Clear, coherent  Affect:   Full range  Mood:   euthymic  Thought process:   normal  Thought content:     WNL  Sensory/Perceptual disturbances:     WNL  Orientation:   x4  Attention:   Good  Concentration:   Good  Memory:   WNL  Fund of knowledge:    WNL  Insight:     good  Judgment:    good  Impulse Control:   good   Reported Symptoms:  Decreased sleep intermittently, some anxiety and irritability, ruminations  Risk Assessment: Danger to Self:  No Self-injurious Behavior: No Danger to Others: No Duty to Warn:no Physical Aggression / Violence:No  Access to Firearms a concern: No  Gang Involvement:No  Patient / guardian was educated about steps to take if suicide or homicide risk level increases between visits: yes While future psychiatric events cannot be accurately predicted, the patient does not currently require acute inpatient psychiatric care and does not currently meet [facility] involuntary commitment criteria.  Medications: Current Outpatient Medications  Medication Sig Dispense Refill   Acetylcysteine 600 MG CAPS Take 600 mg by mouth daily.     b complex vitamins tablet Take 1 tablet by mouth daily. 100 tablet 3   celecoxib (CELEBREX) 200 MG capsule Take 1 capsule (200 mg total) by mouth 2 (two) times daily. Annual appt due is due must see provider for future refills 60 capsule 5   Cholecalciferol (VITAMIN D) 2000 units CAPS 1 daily 100 capsule 3   clonazePAM (KLONOPIN) 0.5 MG tablet Take 1 tablet (0.5 mg total) by mouth 3 (three) times daily as needed. for anxiety 60 tablet 2   divalproex (DEPAKOTE) 500 MG DR tablet Take 5  tablets (2,500 mg total) by mouth at bedtime. 450 tablet 0   esomeprazole (NEXIUM) 40 MG capsule Take 1 capsule (40 mg total) by mouth daily before breakfast. 90 capsule 4   HUMIRA PEN 40 MG/0.4ML PNKT Inject 40 mg into the muscle every 14 (fourteen) days.   1   hydrocortisone 2.5 % ointment Apply topically 2 (two) times daily. 60 g 3   lamoTRIgine (LAMICTAL) 200 MG tablet Take 1 tablet (200 mg total) by mouth daily. 90 tablet 0   [START ON 11/25/2023] methylphenidate (RITALIN) 10 MG tablet Take 1 tablet (10 mg total) by mouth 3 (three) times daily with meals. 90 tablet 0   [START ON 12/23/2023] methylphenidate (RITALIN) 10 MG tablet Take 1 tablet (10 mg total) by mouth 3 (three) times daily with meals. 90 tablet 0   [START ON 12/30/2023] methylphenidate 36 MG PO CR tablet Take 1 tablet (36 mg total) by mouth daily. 30 tablet 0   SYRINGE-NEEDLE, DISP, 3 ML (EASY TOUCH SAFETY SYRINGE) 22G X 1" 3 ML MISC As directed for IM injections 50 each 3   testosterone cypionate (DEPOTESTOSTERONE CYPIONATE) 200 MG/ML injection Inject 1.5 mLs (300 mg total) into the muscle every 14 (fourteen) days. 10 mL 5   traZODone (DESYREL) 50 MG tablet TAKE 1 TO 2 TABLETS BY MOUTH at night for sleep 180 tablet 0   No current facility-administered  medications for this visit.    Allergies  Allergen Reactions   Nsaids Other (See Comments)    Constipation   Subjective:  Patient presents for today's session.    Interventions: Motivational interviewing, supportive therapy  Diagnoses:  No diagnosis found.        Plan: Patient is to use CBT, mindfulness and coping skills to help manage / decrease symptoms associated with their diagnosis.   Continue to take steps toward effective communication in his relationship with is parents to keep stress low.     Long-term goal:   Reduce overall level, frequency, and intensity of the feelings of depression, anxiety for at least 3 consecutive months per patient.   Short-term  goal:  Verbally express understanding of the relationship between feelings of depression, anxiety and their impact on thinking patterns and behaviors. Verbalize an understanding of the role that distorted thinking plays in creating fears, excessive worry, and ruminations. Patient to utilize coping skills as discussed in session. Patient to identify and utilize effective communication skills with others       Patient to report any concerns with his medications to his prescribing provider as needed.  Progress: progressing   Francisco Morgan Session, [facility]

## 2023-11-02 ENCOUNTER — Telehealth: Payer: Self-pay

## 2023-11-02 NOTE — Telephone Encounter (Signed)
 Patient wants to use [facility] for all his MH meds. He does not currently need RF, but wants to change the pended stimulant scripts to [facility].

## 2023-11-02 NOTE — Telephone Encounter (Signed)
 Wants to use only [facility] for his MH meds. Have updated pharmacy profile and make he has the appropriate scripts available.

## 2023-11-02 NOTE — Telephone Encounter (Signed)
 10/29/2023 10/28/2023 2  Methylphenidate 10 Mg Tablet 90.00 30 Ca Cot [id number] Gat (0700) 0/0  [facility]ic[facility] [place] 10/09/2023 10/09/2023 2  Methylphenidate Er 36 Mg Tab 90.00  09/01/2023 07/13/2023 2  Clonazepam 0.5 Mg Tablet 60.00 20 Ca Cot [id number] Gat (0700) 1/2

## 2023-11-03 ENCOUNTER — Other Ambulatory Visit (INDEPENDENT_AMBULATORY_CARE_PROVIDER_SITE_OTHER): Payer: Self-pay

## 2023-11-03 ENCOUNTER — Encounter: Payer: Self-pay | Admitting: Physician Assistant

## 2023-11-03 ENCOUNTER — Encounter: Payer: Self-pay | Admitting: Psychiatry

## 2023-11-03 ENCOUNTER — Ambulatory Visit: Admitting: Physician Assistant

## 2023-11-03 DIAGNOSIS — M79641 Pain in right hand: Secondary | ICD-10-CM | POA: Diagnosis not present

## 2023-11-03 MED ORDER — PREDNISONE 10 MG (21) PO TBPK
ORAL_TABLET | ORAL | 0 refills | Status: AC
Start: 2023-11-03 — End: ?
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
## 2023-11-06 NOTE — Telephone Encounter (Signed)
 No RF needed currently

## 2023-11-12 ENCOUNTER — Ambulatory Visit: Payer: BC Managed Care – PPO | Admitting: Mental Health

## 2023-11-12 DIAGNOSIS — F310 Bipolar disorder, current episode hypomanic: Secondary | ICD-10-CM
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
## 2023-11-17 ENCOUNTER — Ambulatory Visit: Admitting: Orthopedic Surgery

## 2023-11-25 ENCOUNTER — Other Ambulatory Visit: Payer: Self-pay | Admitting: Psychiatry

## 2023-11-25 DIAGNOSIS — F902 Attention-deficit hyperactivity disorder, combined type: Secondary | ICD-10-CM

## 2023-11-26 ENCOUNTER — Ambulatory Visit: Payer: BC Managed Care – PPO | Admitting: Mental Health

## 2023-11-26 DIAGNOSIS — F310 Bipolar disorder, current episode hypomanic: Secondary | ICD-10-CM | POA: Diagnosis not present
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
## 2023-12-10 ENCOUNTER — Ambulatory Visit (INDEPENDENT_AMBULATORY_CARE_PROVIDER_SITE_OTHER): Admitting: Mental Health

## 2023-12-10 DIAGNOSIS — F310 Bipolar disorder, current episode hypomanic: Secondary | ICD-10-CM
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
## 2023-12-14 ENCOUNTER — Other Ambulatory Visit: Payer: Self-pay | Admitting: Psychiatry

## 2023-12-14 DIAGNOSIS — F310 Bipolar disorder, current episode hypomanic: Secondary | ICD-10-CM

## 2023-12-24 ENCOUNTER — Ambulatory Visit (INDEPENDENT_AMBULATORY_CARE_PROVIDER_SITE_OTHER): Admitting: Mental Health

## 2023-12-24 DIAGNOSIS — F310 Bipolar disorder, current episode hypomanic: Secondary | ICD-10-CM

## 2023-12-24 NOTE — Progress Notes (Unsigned)
 Psychotherapy Note  Name: Francisco Morgan Date:  12/24/23   MRN: [id number] DOB: 01/10/1976 PCP: Genia Kettering, MD  Time in: 1:00pm Time out:  1:52pm  Session time:  52  minutes  Treatment: Individual therapy  Mental Status Exam: Patient Appearance:    casual  Behavior:   appropriate  Motor:   WNL  Speech/Language:     Clear, coherent  Affect:   Full range  Mood:   euthymic  Thought process:   normal  Thought content:     WNL  Sensory/Perceptual disturbances:     WNL  Orientation:   x4  Attention:   Good  Concentration:   Good  Memory:   WNL  Fund of knowledge:    WNL  Insight:     good  Judgment:    good  Impulse Control:   good   Reported Symptoms:  Decreased sleep intermittently, some anxiety and irritability, ruminations  Risk Assessment: Danger to Self:  No Self-injurious Behavior: No Danger to Others: No Duty to Warn:no Physical Aggression / Violence:No  Access to Firearms a concern: No  Gang Involvement:No  Patient / guardian was educated about steps to take if suicide or homicide risk level increases between visits: yes While future psychiatric events cannot be accurately predicted, the patient does not currently require acute inpatient psychiatric care and does not currently meet [place]  involuntary commitment criteria.  Medications: Current Outpatient Medications  Medication Sig Dispense Refill   Acetylcysteine 600 MG CAPS Take 600 mg by mouth daily.     b complex vitamins tablet Take 1 tablet by mouth daily. 100 tablet 3   celecoxib  (CELEBREX ) 200 MG capsule Take 1 capsule (200 mg total) by mouth 2 (two) times daily. Annual appt due is due must see provider for future refills 60 capsule 5   Cholecalciferol (VITAMIN D ) 2000 units CAPS 1 daily 100 capsule 3   clonazePAM  (KLONOPIN ) 0.5 MG tablet Take 1 tablet (0.5 mg total) by mouth 3 (three) times daily as needed. for anxiety 60 tablet 2   divalproex  (DEPAKOTE ) 500 MG DR tablet Take 5  tablets (2,500 mg total) by mouth at bedtime. 450 tablet 0   esomeprazole  (NEXIUM ) 40 MG capsule Take 1 capsule (40 mg total) by mouth daily before breakfast. 90 capsule 4   HUMIRA PEN 40 MG/0.4ML PNKT Inject 40 mg into the muscle every 14 (fourteen) days.   1   hydrocortisone  2.5 % ointment Apply topically 2 (two) times daily. 60 g 3   lamoTRIgine  (LAMICTAL ) 200 MG tablet Take 1 tablet (200 mg total) by mouth daily. 90 tablet 0   methylphenidate  (RITALIN ) 10 MG tablet Take 1 tablet (10 mg total) by mouth 3 (three) times daily with meals. 90 tablet 0   methylphenidate  (RITALIN ) 10 MG tablet Take 1 tablet (10 mg total) by mouth 3 (three) times daily with meals. 90 tablet 0   methylphenidate  (RITALIN ) 10 MG tablet Take 1 tablet (10 mg total) by mouth 3 (three) times daily with meals. 90 tablet 0   [START ON 12/30/2023] methylphenidate  36 MG PO CR tablet Take 1 tablet (36 mg total) by mouth daily. 30 tablet 0   predniSONE  (STERAPRED UNI-PAK 21 TAB) 10 MG (21) TBPK tablet Take as directed on the package 21 tablet 0   SYRINGE-NEEDLE, DISP, 3 ML (EASY TOUCH SAFETY SYRINGE) 22G X 1" 3 ML MISC As directed for IM injections 50 each 3   testosterone  cypionate (DEPOTESTOSTERONE CYPIONATE) 200 MG/ML injection Inject 1.5  mLs (300 mg total) into the muscle every 14 (fourteen) days. 10 mL 5   traZODone  (DESYREL ) 50 MG tablet TAKE 1 TO 2 TABLETS BY MOUTH at night for sleep 180 tablet 0   No current facility-administered medications for this visit.    Allergies  Allergen Reactions   Nsaids Other (See Comments)    Constipation   Subjective:  Patient presents for today's session.  Patient shared how he enjoyed his vacation, recently traveling to the music festival.  He shared details, experiences which he found enjoyable, meaningful.  He went on to share dynamics at home, ongoing conflict in the relationship with his mother.  He stated that his father's condition improves however, stated that he continues to have  significant challenges.  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  He stated that there were some experiences that were positive, he feels his parents are further understanding his challenges with mental health, the instances where he can be in a depressed mood which impedes his ability to make progress at times on the renovations.  Through further guided discovery, he identified the need to utilize the financial funds he will receive from the sale of the home eventually and gain more independence in his life, such as moving into his own residence.  Continue to explore collaboratively ways to manage communication with his mother, as this relationship is often strained.      Interventions: Motivational interviewing, supportive therapy  Diagnoses:    ICD-10-CM   1. Bipolar I disorder, current or most recent episode hypomanic (HCC)  F31.0       Plan: Patient is to use CBT, mindfulness and coping skills to help manage / decrease symptoms associated with their diagnosis.   Continue to take steps toward effective communication in his relationship with is parents to keep stress low.     Long-term goal:   Reduce overall level, frequency, and intensity of the feelings of depression, anxiety for at least 3 consecutive months per patient.   Short-term goal:  Verbally express understanding of the relationship between feelings of depression, anxiety and their impact on thinking patterns and behaviors. Verbalize an understanding of the role that distorted thinking plays in creating fears, excessive worry, and ruminations. Patient to utilize coping skills as discussed in session. Patient to identify and utilize effective communication skills with others       Patient to report any concerns with his medications to his prescribing provider as needed.  Progress: progressing   Avram Lenis, [facility]

## 2024-01-07 ENCOUNTER — Ambulatory Visit: Admitting: Mental Health

## 2024-01-07 DIAGNOSIS — F310 Bipolar disorder, current episode hypomanic: Secondary | ICD-10-CM | POA: Diagnosis not present
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
## 2024-01-18 ENCOUNTER — Other Ambulatory Visit: Payer: Self-pay | Admitting: Psychiatry

## 2024-01-18 DIAGNOSIS — F902 Attention-deficit hyperactivity disorder, combined type: Secondary | ICD-10-CM

## 2024-01-18 NOTE — Telephone Encounter (Signed)
 LF 5/28, due 6/25

## 2024-01-18 NOTE — Telephone Encounter (Signed)
 Pt lvm 10:04 requesting methyphenidate 10 mg #90 to [facility]. Apt 8/20

## 2024-01-20 MED ORDER — METHYLPHENIDATE HCL 10 MG PO TABS: 10.0000 mg | ORAL_TABLET | Freq: Three times a day (TID) | ORAL | 0 refills | Status: DC

## 2024-01-20 NOTE — Telephone Encounter (Signed)
 Pended 2 RF for Ritalin  10 mg to [facility]

## 2024-01-21 ENCOUNTER — Ambulatory Visit: Admitting: Mental Health

## 2024-01-21 DIAGNOSIS — F310 Bipolar disorder, current episode hypomanic: Secondary | ICD-10-CM

## 2024-01-21 NOTE — Progress Notes (Signed)
 Psychotherapy Note  Name: Francisco Morgan Date: 01/21/24   MRN: [id number] DOB: 12-24-1975 PCP: Garald Karlynn GAILS, MD  Time in: 1:00pm Time out:  1:51pm  Session time:  51 minutes  Treatment: Individual therapy  Mental Status Exam: Patient Appearance:    casual  Behavior:   appropriate  Motor:   WNL  Speech/Language:     Clear, coherent  Affect:   Full range  Mood:   euthymic  Thought process:   normal  Thought content:     WNL  Sensory/Perceptual disturbances:     WNL  Orientation:   x4  Attention:   Good  Concentration:   Good  Memory:   WNL  Fund of knowledge:    WNL  Insight:     good  Judgment:    good  Impulse Control:   good   Reported Symptoms:  Decreased sleep intermittently, some anxiety and irritability, ruminations  Risk Assessment: Danger to Self:  No Self-injurious Behavior: No Danger to Others: No Duty to Warn:no Physical Aggression / Violence:No  Access to Firearms a concern: No  Gang Involvement:No  Patient / guardian was educated about steps to take if suicide or homicide risk level increases between visits: yes While future psychiatric events cannot be accurately predicted, the patient does not currently require acute inpatient psychiatric care and does not currently meet [place]  involuntary commitment criteria.  Medications: Current Outpatient Medications  Medication Sig Dispense Refill   Acetylcysteine 600 MG CAPS Take 600 mg by mouth daily.     b complex vitamins tablet Take 1 tablet by mouth daily. 100 tablet 3   celecoxib  (CELEBREX ) 200 MG capsule Take 1 capsule (200 mg total) by mouth 2 (two) times daily. Annual appt due is due must see provider for future refills 60 capsule 5   Cholecalciferol (VITAMIN D ) 2000 units CAPS 1 daily 100 capsule 3   clonazePAM  (KLONOPIN ) 0.5 MG tablet Take 1 tablet (0.5 mg total) by mouth 3 (three) times daily as needed. for anxiety 60 tablet 2   divalproex  (DEPAKOTE ) 500 MG DR tablet Take 5  tablets (2,500 mg total) by mouth at bedtime. 450 tablet 0   esomeprazole  (NEXIUM ) 40 MG capsule Take 1 capsule (40 mg total) by mouth daily before breakfast. 90 capsule 4   HUMIRA PEN 40 MG/0.4ML PNKT Inject 40 mg into the muscle every 14 (fourteen) days.   1   hydrocortisone  2.5 % ointment Apply topically 2 (two) times daily. 60 g 3   lamoTRIgine  (LAMICTAL ) 200 MG tablet Take 1 tablet (200 mg total) by mouth daily. 90 tablet 0   methylphenidate  (RITALIN ) 10 MG tablet Take 1 tablet (10 mg total) by mouth 3 (three) times daily with meals. 90 tablet 0   methylphenidate  (RITALIN ) 10 MG tablet Take 1 tablet (10 mg total) by mouth 3 (three) times daily with meals. 90 tablet 0   [START ON 02/17/2024] methylphenidate  (RITALIN ) 10 MG tablet Take 1 tablet (10 mg total) by mouth 3 (three) times daily with meals. 90 tablet 0   methylphenidate  36 MG PO CR tablet Take 1 tablet (36 mg total) by mouth daily. 30 tablet 0   predniSONE  (STERAPRED UNI-PAK 21 TAB) 10 MG (21) TBPK tablet Take as directed on the package 21 tablet 0   SYRINGE-NEEDLE, DISP, 3 ML (EASY TOUCH SAFETY SYRINGE) 22G X 1 3 ML MISC As directed for IM injections 50 each 3   testosterone  cypionate (DEPOTESTOSTERONE CYPIONATE) 200 MG/ML injection Inject 1.5 mLs (300  mg total) into the muscle every 14 (fourteen) days. 10 mL 5   traZODone  (DESYREL ) 50 MG tablet TAKE 1 TO 2 TABLETS BY MOUTH at night for sleep 180 tablet 0   No current facility-administered medications for this visit.    Allergies  Allergen Reactions   Nsaids Other (See Comments)    Constipation   Subjective:  Patient presents for today's session.  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  He went on to share how he continues to also reside with them.  Family relationships were explored where he stated they have been doing okay.  Denies any recent significant arguments or conflicts.  He continues to work on the property, has  challenges at times with getting enough rest.  He stated his sleep can be inconsistent, sometimes staying up for extended hours.  We reviewed the significance of having a consistent sleep regimen.  He identified other interests, shared some past family experiences, taking trips with his mother, this allowing him to be more exposed to traveling and having memorable experiences.   Interventions: Motivational interviewing, supportive therapy  Diagnoses:    ICD-10-CM   1. Bipolar I disorder, current or most recent episode hypomanic (HCC)  F31.0         Plan: Patient is to use CBT, mindfulness and coping skills to help manage / decrease symptoms associated with their diagnosis.   Continue to take steps toward effective communication in his relationship with is parents to keep stress low.     Long-term goal:   Reduce overall level, frequency, and intensity of the feelings of depression, anxiety for at least 3 consecutive months per patient.   Short-term goal:  Verbally express understanding of the relationship between feelings of depression, anxiety and their impact on thinking patterns and behaviors. Verbalize an understanding of the role that distorted thinking plays in creating fears, excessive worry, and ruminations. Patient to utilize coping skills as discussed in session. Patient to identify and utilize effective communication skills with others       Patient to report any concerns with his medications to his prescribing provider

## 2024-01-27 ENCOUNTER — Encounter: Payer: Self-pay | Admitting: Internal Medicine

## 2024-02-03 ENCOUNTER — Ambulatory Visit: Admitting: Psychiatry

## 2024-02-04 ENCOUNTER — Ambulatory Visit: Admitting: Mental Health

## 2024-02-04 NOTE — Progress Notes (Signed)
 No charge for missed session 02/04/24.

## 2024-02-08 ENCOUNTER — Other Ambulatory Visit: Payer: Self-pay

## 2024-02-08 ENCOUNTER — Telehealth: Payer: Self-pay | Admitting: Psychiatry

## 2024-02-08 DIAGNOSIS — F902 Attention-deficit hyperactivity disorder, combined type: Secondary | ICD-10-CM

## 2024-02-08 MED ORDER — METHYLPHENIDATE HCL ER (OSM) 36 MG PO TBCR: 36.0000 mg | EXTENDED_RELEASE_TABLET | Freq: Every day | ORAL | 0 refills | Status: DC

## 2024-02-08 NOTE — Telephone Encounter (Signed)
 Pt lvm that he needs a refill on his concerta  36 mg. He is completely out. Pharmacy is [facility] on [address]

## 2024-02-08 NOTE — Telephone Encounter (Signed)
 Pended.

## 2024-02-14 ENCOUNTER — Other Ambulatory Visit: Payer: Self-pay | Admitting: Psychiatry

## 2024-02-14 DIAGNOSIS — F4310 Post-traumatic stress disorder, unspecified: Secondary | ICD-10-CM

## 2024-02-14 NOTE — Telephone Encounter (Signed)
 Please call patient to schedule FU. Was last seen in Dec with RTC in 2-3 months.

## 2024-02-25 ENCOUNTER — Ambulatory Visit (INDEPENDENT_AMBULATORY_CARE_PROVIDER_SITE_OTHER): Admitting: Mental Health

## 2024-02-25 DIAGNOSIS — F310 Bipolar disorder, current episode hypomanic: Secondary | ICD-10-CM | POA: Diagnosis not present

## 2024-02-25 NOTE — Progress Notes (Signed)
 Psychotherapy Note  Name: Francisco Morgan Date: 02/25/24   MRN: [id number] DOB: 1976-01-29 PCP: Garald Karlynn GAILS, MD  Time in: 1:00pm Time out:  1:52pm  Session time:  52 minutes  Treatment: Individual therapy  Mental Status Exam: Patient Appearance:    casual  Behavior:   appropriate  Motor:   WNL  Speech/Language:     Clear, coherent  Affect:   Full range  Mood:   euthymic  Thought process:   normal  Thought content:     WNL  Sensory/Perceptual disturbances:     WNL  Orientation:   x4  Attention:   Good  Concentration:   Good  Memory:   WNL  Fund of knowledge:    WNL  Insight:     good  Judgment:    good  Impulse Control:   good   Reported Symptoms:  Decreased sleep intermittently, some anxiety and irritability, ruminations  Risk Assessment: Danger to Self:  No Self-injurious Behavior: No Danger to Others: No Duty to Warn:no Physical Aggression / Violence:No  Access to Firearms a concern: No  Gang Involvement:No  Patient / guardian was educated about steps to take if suicide or homicide risk level increases between visits: yes While future psychiatric events cannot be accurately predicted, the patient does not currently require acute inpatient psychiatric care and does not currently meet [place]  involuntary commitment criteria.  Medications: Current Outpatient Medications  Medication Sig Dispense Refill   Acetylcysteine 600 MG CAPS Take 600 mg by mouth daily.     b complex vitamins tablet Take 1 tablet by mouth daily. 100 tablet 3   celecoxib  (CELEBREX ) 200 MG capsule Take 1 capsule (200 mg total) by mouth 2 (two) times daily. Annual appt due is due must see provider for future refills 60 capsule 5   Cholecalciferol (VITAMIN D ) 2000 units CAPS 1 daily 100 capsule 3   clonazePAM  (KLONOPIN ) 0.5 MG tablet Take 1 tablet (0.5 mg total) by mouth 3 (three) times daily as needed. for anxiety 60 tablet 1   divalproex  (DEPAKOTE ) 500 MG DR tablet Take 5  tablets (2,500 mg total) by mouth at bedtime. 450 tablet 0   esomeprazole  (NEXIUM ) 40 MG capsule Take 1 capsule (40 mg total) by mouth daily before breakfast. 90 capsule 4   HUMIRA PEN 40 MG/0.4ML PNKT Inject 40 mg into the muscle every 14 (fourteen) days.   1   hydrocortisone  2.5 % ointment Apply topically 2 (two) times daily. 60 g 3   lamoTRIgine  (LAMICTAL ) 200 MG tablet Take 1 tablet (200 mg total) by mouth daily. 90 tablet 0   methylphenidate  (RITALIN ) 10 MG tablet Take 1 tablet (10 mg total) by mouth 3 (three) times daily with meals. 90 tablet 0   methylphenidate  (RITALIN ) 10 MG tablet Take 1 tablet (10 mg total) by mouth 3 (three) times daily with meals. 90 tablet 0   methylphenidate  (RITALIN ) 10 MG tablet Take 1 tablet (10 mg total) by mouth 3 (three) times daily with meals. 90 tablet 0   methylphenidate  36 MG PO CR tablet Take 1 tablet (36 mg total) by mouth daily. 30 tablet 0   predniSONE  (STERAPRED UNI-PAK 21 TAB) 10 MG (21) TBPK tablet Take as directed on the package 21 tablet 0   SYRINGE-NEEDLE, DISP, 3 ML (EASY TOUCH SAFETY SYRINGE) 22G X 1 3 ML MISC As directed for IM injections 50 each 3   testosterone  cypionate (DEPOTESTOSTERONE CYPIONATE) 200 MG/ML injection Inject 1.5 mLs (300 mg total) into  the muscle every 14 (fourteen) days. 10 mL 5   traZODone  (DESYREL ) 50 MG tablet TAKE 1 TO 2 TABLETS BY MOUTH at night for sleep 180 tablet 0   No current facility-administered medications for this visit.    Allergies  Allergen Reactions   Nsaids Other (See Comments)    Constipation   Subjective:  Patient presents for today's session.  He shared progress over the last few weeks.  He went on to share specifically how he and his parents had a disagreement regarding their renovation property.  He stated that he plans to continue to talk with his father in the hopes that they can come to an agreement.  Provide support and understanding as he identified feelings of frustration due to his  considerable work he has put in the project over the past few years.  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  At this point, he acknowledges that they plan to sell the property although is not fully completed with all details.  He stated that his mother was recently diagnosed with breast cancer, he further acknowledged how he understands that they both are coping with medical issues and plans to be sensitive in terms of the timing of the discussion.  Facilitated his identifying needs, and how to frame recent stressors toward reducing stress and communicating effectively.   Interventions: Motivational interviewing, supportive therapy  Diagnoses:    ICD-10-CM   1. Bipolar I disorder, current or most recent episode hypomanic (HCC)  F31.0          Plan: Patient is to use CBT, mindfulness and coping skills to help manage / decrease symptoms associated with their diagnosis.   Continue to take steps toward effective communication in his relationship with is parents to keep stress low.     Long-term goal:   Reduce overall level, frequency, and intensity of the feelings of depression, anxiety for at least 3 consecutive months per patient.   Short-term goal:  Verbally express understanding of the relationship between feelings of depression, anxiety and their impact on thinking patterns and behaviors. Verbalize an understanding of the role that distorted thinking plays in creating fears, excessive worry, and ruminations. Patient to utilize coping skills as discussed in session. Patient to identify and utilize effective communication skills with others       Patient to report any concerns with his medications to his prescribing provider

## 2024-03-07 ENCOUNTER — Other Ambulatory Visit: Payer: Self-pay | Admitting: Psychiatry

## 2024-03-07 DIAGNOSIS — F310 Bipolar disorder, current episode hypomanic: Secondary | ICD-10-CM

## 2024-03-10 ENCOUNTER — Ambulatory Visit (INDEPENDENT_AMBULATORY_CARE_PROVIDER_SITE_OTHER): Admitting: Mental Health

## 2024-03-10 DIAGNOSIS — F310 Bipolar disorder, current episode hypomanic: Secondary | ICD-10-CM | POA: Diagnosis not present

## 2024-03-10 NOTE — Progress Notes (Signed)
 Psychotherapy Note  Name: Francisco Morgan Date: 03/11/24   MRN: [id number] DOB: 1976-02-09 PCP: Garald Karlynn GAILS, MD  Time in: 1:00pm Time out:  1:51pm  Session time:  51 minutes  Treatment: Individual therapy  Mental Status Exam: Patient Appearance:    casual  Behavior:   appropriate  Motor:   WNL  Speech/Language:     Clear, coherent  Affect:   Full range  Mood:   euthymic  Thought process:   normal  Thought content:     WNL  Sensory/Perceptual disturbances:     WNL  Orientation:   x4  Attention:   Good  Concentration:   Good  Memory:   WNL  Fund of knowledge:    WNL  Insight:     good  Judgment:    good  Impulse Control:   good   Reported Symptoms:  Decreased sleep intermittently, some anxiety and irritability, ruminations  Risk Assessment: Danger to Self:  No Self-injurious Behavior: No Danger to Others: No Duty to Warn:no Physical Aggression / Violence:No  Access to Firearms a concern: No  Gang Involvement:No  Patient / guardian was educated about steps to take if suicide or homicide risk level increases between visits: yes While future psychiatric events cannot be accurately predicted, the patient does not currently require acute inpatient psychiatric care and does not currently meet [place]  involuntary commitment criteria.  Medications: Current Outpatient Medications  Medication Sig Dispense Refill   Acetylcysteine 600 MG CAPS Take 600 mg by mouth daily.     b complex vitamins tablet Take 1 tablet by mouth daily. 100 tablet 3   celecoxib  (CELEBREX ) 200 MG capsule Take 1 capsule (200 mg total) by mouth 2 (two) times daily. Annual appt due is due must see provider for future refills 60 capsule 5   Cholecalciferol (VITAMIN D ) 2000 units CAPS 1 daily 100 capsule 3   clonazePAM  (KLONOPIN ) 0.5 MG tablet Take 1 tablet (0.5 mg total) by mouth 3 (three) times daily as needed. for anxiety 60 tablet 1   divalproex  (DEPAKOTE ) 500 MG DR tablet Take 5  tablets (2,500 mg total) by mouth at bedtime. 450 tablet 0   esomeprazole  (NEXIUM ) 40 MG capsule Take 1 capsule (40 mg total) by mouth daily before breakfast. 90 capsule 4   HUMIRA PEN 40 MG/0.4ML PNKT Inject 40 mg into the muscle every 14 (fourteen) days.   1   hydrocortisone  2.5 % ointment Apply topically 2 (two) times daily. 60 g 3   lamoTRIgine  (LAMICTAL ) 200 MG tablet Take 1 tablet (200 mg total) by mouth daily. 90 tablet 0   methylphenidate  (RITALIN ) 10 MG tablet Take 1 tablet (10 mg total) by mouth 3 (three) times daily with meals. 90 tablet 0   methylphenidate  (RITALIN ) 10 MG tablet Take 1 tablet (10 mg total) by mouth 3 (three) times daily with meals. 90 tablet 0   methylphenidate  (RITALIN ) 10 MG tablet Take 1 tablet (10 mg total) by mouth 3 (three) times daily with meals. 90 tablet 0   methylphenidate  36 MG PO CR tablet Take 1 tablet (36 mg total) by mouth daily. 30 tablet 0   predniSONE  (STERAPRED UNI-PAK 21 TAB) 10 MG (21) TBPK tablet Take as directed on the package 21 tablet 0   SYRINGE-NEEDLE, DISP, 3 ML (EASY TOUCH SAFETY SYRINGE) 22G X 1 3 ML MISC As directed for IM injections 50 each 3   testosterone  cypionate (DEPOTESTOSTERONE CYPIONATE) 200 MG/ML injection Inject 1.5 mLs (300 mg total) into  the muscle every 14 (fourteen) days. 10 mL 5   traZODone  (DESYREL ) 50 MG tablet TAKE 1 TO 2 TABLETS BY MOUTH at night for sleep 180 tablet 0   No current facility-administered medications for this visit.    Allergies  Allergen Reactions   Nsaids Other (See Comments)    Constipation   Subjective:  Patient presents for today's session.  Assessed progress.  He stated that he continues to work on his renovation project with his family.  He stated they had some disagreements recently but were able to talk through some of the challenges identified.  He went on to share many details.  Ultimately, he and his mother were able to have more effective communication where he is specifically  identified the need for her to be more clear where they can work in a collaborative fashion.  He stated that she was understanding, validating his efforts working on the project.  Facilitated his identifying how he feels this was effective communication for himself where he admitted that he sometimes will have a tone which was identified by his mother as well but that he follows through with the work on the house showing his support of both his parents and his intention for it to be a property from which they all can benefit. Interventions: Motivational interviewing, supportive therapy  Diagnoses:    ICD-10-CM   1. Bipolar I disorder, current or most recent episode hypomanic (HCC)  F31.0           Plan: Patient is to use CBT, mindfulness and coping skills to help manage / decrease symptoms associated with their diagnosis.   Continue to take steps toward effective communication in his relationship with is parents to keep stress low.     Long-term goal:   Reduce overall level, frequency, and intensity of the feelings of depression, anxiety for at least 3 consecutive months per patient.   Short-term goal:  Verbally express understanding of the relationship between feelings of depression, anxiety and their impact on thinking patterns and behaviors. Verbalize an understanding of the role that distorted thinking plays in creating fears, excessive worry, and ruminations. Patient to utilize coping skills as discussed in session. Patient to identify and utilize effective communication skills with others       Patient to report any concerns with his medications to his prescribing provider

## 2024-03-14 ENCOUNTER — Other Ambulatory Visit: Payer: Self-pay | Admitting: Psychiatry

## 2024-03-14 DIAGNOSIS — F902 Attention-deficit hyperactivity disorder, combined type: Secondary | ICD-10-CM

## 2024-03-16 ENCOUNTER — Encounter: Payer: Self-pay | Admitting: Psychiatry

## 2024-03-16 ENCOUNTER — Ambulatory Visit (INDEPENDENT_AMBULATORY_CARE_PROVIDER_SITE_OTHER): Admitting: Psychiatry

## 2024-03-16 DIAGNOSIS — F310 Bipolar disorder, current episode hypomanic: Secondary | ICD-10-CM

## 2024-03-16 DIAGNOSIS — F4310 Post-traumatic stress disorder, unspecified: Secondary | ICD-10-CM

## 2024-03-16 DIAGNOSIS — F902 Attention-deficit hyperactivity disorder, combined type: Secondary | ICD-10-CM

## 2024-03-16 DIAGNOSIS — F5105 Insomnia due to other mental disorder: Secondary | ICD-10-CM | POA: Diagnosis not present
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
## 2024-03-18 ENCOUNTER — Encounter: Payer: Self-pay | Admitting: Psychiatry

## 2024-03-19 ENCOUNTER — Other Ambulatory Visit: Payer: Self-pay | Admitting: Psychiatry

## 2024-03-19 DIAGNOSIS — F310 Bipolar disorder, current episode hypomanic: Secondary | ICD-10-CM

## 2024-03-23 ENCOUNTER — Ambulatory Visit: Admitting: Family

## 2024-03-24 ENCOUNTER — Other Ambulatory Visit: Payer: Self-pay

## 2024-03-24 ENCOUNTER — Telehealth: Payer: Self-pay | Admitting: Psychiatry

## 2024-03-24 ENCOUNTER — Ambulatory Visit: Admitting: Mental Health

## 2024-03-24 DIAGNOSIS — F902 Attention-deficit hyperactivity disorder, combined type: Secondary | ICD-10-CM

## 2024-03-24 MED ORDER — METHYLPHENIDATE HCL 10 MG PO TABS: 10.0000 mg | ORAL_TABLET | Freq: Three times a day (TID) | ORAL | 0 refills | Status: DC

## 2024-03-24 NOTE — Telephone Encounter (Signed)
 LVM to [facility]. He was getting filled recently at [facility]. Will send in 3 RF if he is going to consistently use a pharmacy.

## 2024-03-24 NOTE — Telephone Encounter (Signed)
 Pt requesting Rx methylphenidate  10 mg 3/d to [facility] [address] next apt 2/23

## 2024-03-24 NOTE — Telephone Encounter (Signed)
 Pended

## 2024-04-07 ENCOUNTER — Ambulatory Visit: Admitting: Mental Health

## 2024-04-07 DIAGNOSIS — F310 Bipolar disorder, current episode hypomanic: Secondary | ICD-10-CM | POA: Diagnosis not present

## 2024-04-07 NOTE — Progress Notes (Signed)
 Psychotherapy Note  Name: Francisco Morgan Date:  04/07/24   MRN: [id number] DOB: 1975-12-31 PCP: Garald Karlynn GAILS, MD  Time in: 1:00pm Time out:  1:52pm  Session time:  52 minutes  Treatment: Individual therapy  Mental Status Exam: Patient Appearance:    casual  Behavior:   appropriate  Motor:   WNL  Speech/Language:     Clear, coherent  Affect:   Full range  Mood:   euthymic  Thought process:   normal  Thought content:     WNL  Sensory/Perceptual disturbances:     WNL  Orientation:   x4  Attention:   Good  Concentration:   Good  Memory:   WNL  Fund of knowledge:    WNL  Insight:     good  Judgment:    good  Impulse Control:   good   Reported Symptoms:  Decreased sleep intermittently, some anxiety and irritability, ruminations  Risk Assessment: Danger to Self:  No Self-injurious Behavior: No Danger to Others: No Duty to Warn:no Physical Aggression / Violence:No  Access to Firearms a concern: No  Gang Involvement:No  Patient / guardian was educated about steps to take if suicide or homicide risk level increases between visits: yes While future psychiatric events cannot be accurately predicted, the patient does not currently require acute inpatient psychiatric care and does not currently meet [place]  involuntary commitment criteria.  Medications: Current Outpatient Medications  Medication Sig Dispense Refill   Acetylcysteine 600 MG CAPS Take 600 mg by mouth daily.     b complex vitamins tablet Take 1 tablet by mouth daily. 100 tablet 3   celecoxib  (CELEBREX ) 200 MG capsule Take 1 capsule (200 mg total) by mouth 2 (two) times daily. Annual appt due is due must see provider for future refills 60 capsule 5   Cholecalciferol (VITAMIN D ) 2000 units CAPS 1 daily 100 capsule 3   clonazePAM  (KLONOPIN ) 0.5 MG tablet Take 1 tablet (0.5 mg total) by mouth 3 (three) times daily as needed. for anxiety 60 tablet 1   divalproex  (DEPAKOTE ) 500 MG DR tablet Take 5  tablets (2,500 mg total) by mouth at bedtime. 450 tablet 0   esomeprazole  (NEXIUM ) 40 MG capsule Take 1 capsule (40 mg total) by mouth daily before breakfast. 90 capsule 4   HUMIRA PEN 40 MG/0.4ML PNKT Inject 40 mg into the muscle every 14 (fourteen) days.   1   hydrocortisone  2.5 % ointment Apply topically 2 (two) times daily. 60 g 3   lamoTRIgine  (LAMICTAL ) 200 MG tablet Take 1 tablet (200 mg total) by mouth daily. 90 tablet 0   methylphenidate  (RITALIN ) 10 MG tablet Take 1 tablet (10 mg total) by mouth 3 (three) times daily with meals. 90 tablet 0   methylphenidate  (RITALIN ) 10 MG tablet Take 1 tablet (10 mg total) by mouth 3 (three) times daily with meals. 90 tablet 0   methylphenidate  (RITALIN ) 10 MG tablet Take 1 tablet (10 mg total) by mouth 3 (three) times daily with meals. 90 tablet 0   METHYLPHENIDATE  36 MG PO CR tablet Take 1 tablet (36 mg total) by mouth daily. 30 tablet 0   predniSONE  (STERAPRED UNI-PAK 21 TAB) 10 MG (21) TBPK tablet Take as directed on the package 21 tablet 0   SYRINGE-NEEDLE, DISP, 3 ML (EASY TOUCH SAFETY SYRINGE) 22G X 1 3 ML MISC As directed for IM injections 50 each 3   testosterone  cypionate (DEPOTESTOSTERONE CYPIONATE) 200 MG/ML injection Inject 1.5 mLs (300 mg total)  into the muscle every 14 (fourteen) days. 10 mL 5   traZODone  (DESYREL ) 50 MG tablet TAKE 1 TO 2 TABLETS BY MOUTH at night for sleep 180 tablet 0   No current facility-administered medications for this visit.    Allergies  Allergen Reactions   Nsaids Other (See Comments)    Constipation   Subjective:  Patient presents for today's session.  Assessed progress.  Patient shared how he continues to have some challenges in the relationship with his parents.  He stated they continue to work collaboratively on the renovation project for one of their homes.  Patient shared how they were not fully honest with him as they were getting information from him to use to get estimates with other vendors.  He  further shared the dynamic he has with his parents, where he characterizes his mother being financially frugal where he typically has more relational conflict.  He stated that this can also occur with his father at times.  He denies any physical aggression toward parents as he stated that his mother recently told him that sometimes they are afraid of him.  He stated that both he and his parents got into an argument yesterday and at this point he shared his acceptance of they are using a company with a general contractor to complete the remaining tasks to ready the house for sale.  Facilitated his identifying needs where he plans to allow for the company that has started to continue the work toward completion.  Interventions: Motivational interviewing, supportive therapy  Diagnoses:    ICD-10-CM   1. Bipolar I disorder, current or most recent episode hypomanic (HCC)  F31.0            Plan: Patient is to use CBT, mindfulness and coping skills to help manage / decrease symptoms associated with their diagnosis.   Continue to take steps toward effective communication in his relationship with is parents to keep stress low.     Long-term goal:   Reduce overall level, frequency, and intensity of the feelings of depression, anxiety for at least 3 consecutive months per patient.   Short-term goal:  Verbally express understanding of the relationship between feelings of depression, anxiety and their impact on thinking patterns and behaviors. Verbalize an understanding of the role that distorted thinking plays in creating fears, excessive worry, and ruminations. Patient to utilize coping skills as discussed in session. Patient to identify and utilize effective communication skills with others       Patient to report any concerns with his medications to his prescribing provider

## 2024-04-12 ENCOUNTER — Other Ambulatory Visit: Payer: Self-pay | Admitting: Internal Medicine

## 2024-04-15 ENCOUNTER — Other Ambulatory Visit: Payer: Self-pay | Admitting: Psychiatry

## 2024-04-15 DIAGNOSIS — F902 Attention-deficit hyperactivity disorder, combined type: Secondary | ICD-10-CM

## 2024-04-19 ENCOUNTER — Encounter: Payer: Self-pay | Admitting: Internal Medicine

## 2024-04-21 ENCOUNTER — Other Ambulatory Visit: Payer: Self-pay | Admitting: Psychiatry

## 2024-04-21 ENCOUNTER — Ambulatory Visit (INDEPENDENT_AMBULATORY_CARE_PROVIDER_SITE_OTHER): Admitting: Mental Health

## 2024-04-21 DIAGNOSIS — F310 Bipolar disorder, current episode hypomanic: Secondary | ICD-10-CM | POA: Diagnosis not present

## 2024-04-21 DIAGNOSIS — F5105 Insomnia due to other mental disorder: Secondary | ICD-10-CM

## 2024-04-21 DIAGNOSIS — F902 Attention-deficit hyperactivity disorder, combined type: Secondary | ICD-10-CM

## 2024-04-21 NOTE — Progress Notes (Unsigned)
 Psychotherapy Note  Name: Francisco Morgan Date:  04/22/24   MRN: [id number] DOB: [birth year] PCP: Garald Karlynn GAILS, MD  Time in: 2:00pm Time out:  2:51pm  Session time:  51 minutes  Treatment: Individual therapy  Mental Status Exam: Patient Appearance:    casual  Behavior:   appropriate  Motor:   WNL  Speech/Language:     Clear, coherent  Affect:   Full range  Mood:   euthymic  Thought process:   normal  Thought content:     WNL  Sensory/Perceptual disturbances:     WNL  Orientation:   x4  Attention:   Good  Concentration:   Good  Memory:   WNL  Fund of knowledge:    WNL  Insight:     good  Judgment:    good  Impulse Control:   good   Reported Symptoms:  Decreased sleep intermittently, some anxiety and irritability, ruminations  Risk Assessment: Danger to Self:  No Self-injurious Behavior: No Danger to Others: No Duty to Warn:no Physical Aggression / Violence:No  Access to Firearms a concern: No  Gang Involvement:No  Patient / guardian was educated about steps to take if suicide or homicide risk level increases between visits: yes While future psychiatric events cannot be accurately predicted, the patient does not currently require acute inpatient psychiatric care and does not currently meet [place]  involuntary commitment criteria.  Medications: Current Outpatient Medications  Medication Sig Dispense Refill   Acetylcysteine 600 MG CAPS Take 600 mg by mouth daily.     b complex vitamins tablet Take 1 tablet by mouth daily. 100 tablet 3   celecoxib  (CELEBREX ) 200 MG capsule Take 1 capsule (200 mg total) by mouth 2 (two) times daily. Annual appt due is due must see provider for future refills 60 capsule 5   Cholecalciferol (VITAMIN D ) 2000 units CAPS 1 daily 100 capsule 3   clonazePAM  (KLONOPIN ) 0.5 MG tablet Take 1 tablet (0.5 mg total) by mouth 3 (three) times daily as needed. for anxiety 60 tablet 1   divalproex  (DEPAKOTE ) 500 MG DR tablet Take 5  tablets (2,500 mg total) by mouth at bedtime. 450 tablet 0   esomeprazole  (NEXIUM ) 40 MG capsule Take 1 capsule (40 mg total) by mouth daily before breakfast. 90 capsule 4   HUMIRA PEN 40 MG/0.4ML PNKT Inject 40 mg into the muscle every 14 (fourteen) days.   1   hydrocortisone  2.5 % ointment Apply topically 2 (two) times daily. 60 g 3   lamoTRIgine  (LAMICTAL ) 200 MG tablet Take 1 tablet (200 mg total) by mouth daily. 90 tablet 0   methylphenidate  (RITALIN ) 10 MG tablet Take 1 tablet (10 mg total) by mouth 3 (three) times daily with meals. 90 tablet 0   methylphenidate  (RITALIN ) 10 MG tablet Take 1 tablet (10 mg total) by mouth 3 (three) times daily with meals. 90 tablet 0   methylphenidate  (RITALIN ) 10 MG tablet Take 1 tablet (10 mg total) by mouth 3 (three) times daily with meals. 90 tablet 0   METHYLPHENIDATE  36 MG PO CR tablet Take 1 tablet (36 mg total) by mouth daily. 30 tablet 0   [START ON 05/13/2024] methylphenidate  36 MG PO CR tablet Take 1 tablet (36 mg total) by mouth daily. 30 tablet 0   [START ON 06/10/2024] methylphenidate  36 MG PO CR tablet Take 1 tablet (36 mg total) by mouth daily. 30 tablet 0   predniSONE  (STERAPRED UNI-PAK 21 TAB) 10 MG (21) TBPK tablet Take as  directed on the package 21 tablet 0   SYRINGE-NEEDLE, DISP, 3 ML (EASY TOUCH SAFETY SYRINGE) 22G X 1 3 ML MISC As directed for IM injections 50 each 3   testosterone  cypionate (DEPOTESTOSTERONE CYPIONATE) 200 MG/ML injection INJECT 1.5ML INTO THE MUSCLE ONCE EVERY FOURTEEN DAYS 10 mL 1   traZODone  (DESYREL ) 50 MG tablet TAKE 1 TO 2 TABLETS BY MOUTH at night for sleep 180 tablet 0   No current facility-administered medications for this visit.    Allergies  Allergen Reactions   Nsaids Other (See Comments)    Constipation   Subjective:  Patient presents for today's session.  Patient presented for session with a focus on ongoing challenges with his parents, specifically with out of his mother.  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  He stated that he focuses on tasks to complete but learns from his mother that some of his task may need to be completed sooner per the general contractor which leaves him feeling confused.  He stated that, given his extensive knowledge and skill in this area he knows and understands the work needed to be done and has no problem with working in conjunction with others at the job.  Through further guided discovery, he identified not feeling trusted or validated given his knowledge in this area and others often by his parents, most notably by his mother.  He shared how he feels that he focused on other characteristics and accomplishments, such as other family members who have thrived in academia as he did not complete his degree.   Interventions: Motivational interviewing, supportive therapy  Diagnoses:    ICD-10-CM   1. Attention deficit hyperactivity disorder (ADHD), combined type  F90.2     2. Bipolar I disorder, current or most recent episode hypomanic (HCC)  F31.0        Plan: Patient is to use CBT, mindfulness and coping skills to help manage / decrease symptoms associated with their diagnosis.   Continue to take steps toward effective communication in his relationship with is parents to keep stress low.     Long-term goal:   Reduce overall level, frequency, and intensity of the feelings of depression, anxiety for at least 3 consecutive months per patient.   Short-term goal:  Verbally express understanding of the relationship between feelings of depression, anxiety and their impact on thinking patterns and behaviors. Verbalize an understanding of the role that distorted thinking plays in creating fears, excessive worry, and ruminations. Patient to utilize coping skills as discussed in session. Patient to identify and utilize effective communication skills with others       Patient to report  any concerns with his medications to his prescribing provider

## 2024-04-25 ENCOUNTER — Other Ambulatory Visit: Payer: Self-pay | Admitting: Internal Medicine

## 2024-04-25 MED ORDER — SYRINGE/NEEDLE (DISP) 23G X 1" 3 ML MISC: 3 refills | Status: AC

## 2024-05-05 ENCOUNTER — Ambulatory Visit (INDEPENDENT_AMBULATORY_CARE_PROVIDER_SITE_OTHER): Admitting: Mental Health

## 2024-05-05 DIAGNOSIS — F310 Bipolar disorder, current episode hypomanic: Secondary | ICD-10-CM

## 2024-05-05 DIAGNOSIS — F902 Attention-deficit hyperactivity disorder, combined type: Secondary | ICD-10-CM

## 2024-05-05 NOTE — Progress Notes (Signed)
 Psychotherapy Note  Name: Francisco Morgan Date:  05/05/24   MRN: [id number] DOB: Feb 14, 1976 PCP: Garald Karlynn GAILS, MD  Time in: 2:00pm Time out:  2:52pm  Session time:  52 minutes  Treatment: Individual therapy  Mental Status Exam: Patient Appearance:    casual  Behavior:   appropriate  Motor:   WNL  Speech/Language:     Clear, coherent  Affect:   Full range  Mood:   euthymic  Thought process:   normal  Thought content:     WNL  Sensory/Perceptual disturbances:     WNL  Orientation:   x4  Attention:   Good  Concentration:   Good  Memory:   WNL  Fund of knowledge:    WNL  Insight:     good  Judgment:    good  Impulse Control:   good   Reported Symptoms:  Decreased sleep intermittently, some anxiety and irritability, ruminations  Risk Assessment: Danger to Self:  No Self-injurious Behavior: No Danger to Others: No Duty to Warn:no Physical Aggression / Violence:No  Access to Firearms a concern: No  Gang Involvement:No  Patient / guardian was educated about steps to take if suicide or homicide risk level increases between visits: yes While future psychiatric events cannot be accurately predicted, the patient does not currently require acute inpatient psychiatric care and does not currently meet [name]  involuntary commitment criteria.  Medications: Current Outpatient Medications  Medication Sig Dispense Refill   Acetylcysteine 600 MG CAPS Take 600 mg by mouth daily.     b complex vitamins tablet Take 1 tablet by mouth daily. 100 tablet 3   celecoxib  (CELEBREX ) 200 MG capsule Take 1 capsule (200 mg total) by mouth 2 (two) times daily. Annual appt due is due must see provider for future refills 60 capsule 5   Cholecalciferol (VITAMIN D ) 2000 units CAPS 1 daily 100 capsule 3   clonazePAM  (KLONOPIN ) 0.5 MG tablet Take 1 tablet (0.5 mg total) by mouth 3 (three) times daily as needed. for anxiety 60 tablet 1   divalproex  (DEPAKOTE ) 500 MG DR tablet Take 5  tablets (2,500 mg total) by mouth at bedtime. 450 tablet 0   esomeprazole  (NEXIUM ) 40 MG capsule Take 1 capsule (40 mg total) by mouth daily before breakfast. 90 capsule 4   HUMIRA PEN 40 MG/0.4ML PNKT Inject 40 mg into the muscle every 14 (fourteen) days.   1   hydrocortisone  2.5 % ointment Apply topically 2 (two) times daily. 60 g 3   lamoTRIgine  (LAMICTAL ) 200 MG tablet Take 1 tablet (200 mg total) by mouth daily. 90 tablet 0   methylphenidate  (RITALIN ) 10 MG tablet Take 1 tablet (10 mg total) by mouth 3 (three) times daily with meals. 90 tablet 0   methylphenidate  (RITALIN ) 10 MG tablet Take 1 tablet (10 mg total) by mouth 3 (three) times daily with meals. 90 tablet 0   methylphenidate  (RITALIN ) 10 MG tablet Take 1 tablet (10 mg total) by mouth 3 (three) times daily with meals. 90 tablet 0   METHYLPHENIDATE  36 MG PO CR tablet Take 1 tablet (36 mg total) by mouth daily. 30 tablet 0   [START ON 05/13/2024] methylphenidate  36 MG PO CR tablet Take 1 tablet (36 mg total) by mouth daily. 30 tablet 0   [START ON 06/10/2024] methylphenidate  36 MG PO CR tablet Take 1 tablet (36 mg total) by mouth daily. 30 tablet 0   predniSONE  (STERAPRED UNI-PAK 21 TAB) 10 MG (21) TBPK tablet Take as  directed on the package 21 tablet 0   SYRINGE-NEEDLE, DISP, 3 ML 23G X 1 3 ML MISC Use IM for testosterone  injections 50 each 3   testosterone  cypionate (DEPOTESTOSTERONE CYPIONATE) 200 MG/ML injection INJECT 1.5ML INTO THE MUSCLE ONCE EVERY FOURTEEN DAYS 10 mL 1   traZODone  (DESYREL ) 50 MG tablet TAKE 1 TO 2 TABLETS BY MOUTH at night for sleep 180 tablet 0   No current facility-administered medications for this visit.    Allergies  Allergen Reactions   Nsaids Other (See Comments)    Constipation   Subjective:  Patient presents for today's session.  Assessed progress per patient shared challenges with his relationship with his parents most notably with his mother.  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  He stated at this point, they no longer want him working on the property due to a recent discussion with the Product/process development scientist.  He stated that he is been there working 10 to 12-hour days for the past 2 weeks, only seeing the Product/process development scientist and/or his workers just a handful of days.  He stated that he does not feel his opinions are supported or validated given his extensive knowledge.  He shared the challenges with trying to discuss with his mother, considers her often histrionic and temperamental at times talking with others, subcontractor's.  He stated that she also struggles to maintain a professional boundary as she begins to treat the relationship with his friendships.  He stated this further leading her to the disadvantage at times in his view when discussing the work projects, timeframes or Merchant navy officer.  Through further guided discovery, he considers this another instance in his life where he does not feel supported or validated by his parents.  Provide support throughout, facilitating his identifying needs where he plans to consider other options for employment parents this work project comes to a colitis.   Interventions: Motivational interviewing, supportive therapy  Diagnoses:    ICD-10-CM   1. Bipolar I disorder, current or most recent episode hypomanic (HCC)  F31.0     2. Attention deficit hyperactivity disorder (ADHD), combined type  F90.2         Plan: Patient is to use CBT, mindfulness and coping skills to help manage / decrease symptoms associated with their diagnosis.   Continue to take steps toward effective communication in his relationship with is parents to keep stress low.     Long-term goal:   Reduce overall level, frequency, and intensity of the feelings of depression, anxiety for at least 3 consecutive months per patient.   Short-term goal:  Verbally express understanding of the relationship between feelings of depression, anxiety  and their impact on thinking patterns and behaviors. Verbalize an understanding of the role that distorted thinking plays in creating fears, excessive worry, and ruminations. Patient to utilize coping skills as discussed in session. Patient to identify and utilize effective communication skills with others       Patient to report any concerns with his medications to his prescribing provider

## 2024-05-16 ENCOUNTER — Other Ambulatory Visit: Payer: Self-pay | Admitting: Psychiatry

## 2024-05-16 DIAGNOSIS — F902 Attention-deficit hyperactivity disorder, combined type: Secondary | ICD-10-CM

## 2024-05-16 DIAGNOSIS — F4310 Post-traumatic stress disorder, unspecified: Secondary | ICD-10-CM

## 2024-05-16 NOTE — Telephone Encounter (Signed)
 04/21/2024 01/20/2024 1  Methylphenidate  10 Mg Tablet 90.00 30 Ca Cot [id number] Gat (0700) 0/0  Medicare [place] 04/15/2024 02/15/2024 1  Clonazepam  0.5 Mg Tablet 60.00 20 Ca Cot [id number] Gat (0700) 1/1 3.00 LME Medicare [place] 04/15/2024 04/15/2024 1  Methylphenidate  Er 36 Mg Tab 30.00

## 2024-05-18 ENCOUNTER — Other Ambulatory Visit: Payer: Self-pay

## 2024-05-18 ENCOUNTER — Telehealth: Payer: Self-pay | Admitting: Psychiatry

## 2024-05-18 DIAGNOSIS — F902 Attention-deficit hyperactivity disorder, combined type: Secondary | ICD-10-CM

## 2024-05-18 MED ORDER — METHYLPHENIDATE HCL 10 MG PO TABS: 10.0000 mg | ORAL_TABLET | Freq: Three times a day (TID) | ORAL | 0 refills | Status: AC

## 2024-05-18 NOTE — Telephone Encounter (Signed)
 Pended to [facility], on short-term BO at [facility].

## 2024-05-18 NOTE — Telephone Encounter (Signed)
 Pt lvm 6:00 pm. [facility] for Methylphenidate  10 mg PRN #90. Please sent to [facility] [address] Advise pt when sent [phone]

## 2024-05-19 ENCOUNTER — Ambulatory Visit: Admitting: Mental Health

## 2024-05-19 DIAGNOSIS — F902 Attention-deficit hyperactivity disorder, combined type: Secondary | ICD-10-CM | POA: Diagnosis not present

## 2024-05-19 NOTE — Progress Notes (Signed)
 Psychotherapy Note  Name: Francisco Morgan Date:  05/19/24   MRN: [id number] DOB: [birth year] PCP: Garald Karlynn GAILS, MD  Time in: 2:00pm Time out:  2:51pm  Session time:  51 minutes  Treatment: Individual therapy  Mental Status Exam: Patient Appearance:    casual  Behavior:   appropriate  Motor:   WNL  Speech/Language:     Clear, coherent  Affect:   Full range  Mood:   euthymic  Thought process:   normal  Thought content:     WNL  Sensory/Perceptual disturbances:     WNL  Orientation:   x4  Attention:   Good  Concentration:   Good  Memory:   WNL  Fund of knowledge:    WNL  Insight:     good  Judgment:    good  Impulse Control:   good   Reported Symptoms:  Decreased sleep intermittently, some anxiety and irritability, ruminations  Risk Assessment: Danger to Self:  No Self-injurious Behavior: No Danger to Others: No Duty to Warn:no Physical Aggression / Violence:No  Access to Firearms a concern: No  Gang Involvement:No  Patient / guardian was educated about steps to take if suicide or homicide risk level increases between visits: yes While future psychiatric events cannot be accurately predicted, the patient does not currently require acute inpatient psychiatric care and does not currently meet [place]  involuntary commitment criteria.  Medications: Current Outpatient Medications  Medication Sig Dispense Refill   Acetylcysteine 600 MG CAPS Take 600 mg by mouth daily.     b complex vitamins tablet Take 1 tablet by mouth daily. 100 tablet 3   celecoxib  (CELEBREX ) 200 MG capsule Take 1 capsule (200 mg total) by mouth 2 (two) times daily. Annual appt due is due must see provider for future refills 60 capsule 5   Cholecalciferol (VITAMIN D ) 2000 units CAPS 1 daily 100 capsule 3   clonazePAM  (KLONOPIN ) 0.5 MG tablet Take 1 tablet (0.5 mg total) by mouth 3 (three) times daily as needed. for anxiety 60 tablet 0   divalproex  (DEPAKOTE ) 500 MG DR tablet Take 5  tablets (2,500 mg total) by mouth at bedtime. 450 tablet 0   esomeprazole  (NEXIUM ) 40 MG capsule Take 1 capsule (40 mg total) by mouth daily before breakfast. 90 capsule 4   HUMIRA PEN 40 MG/0.4ML PNKT Inject 40 mg into the muscle every 14 (fourteen) days.   1   hydrocortisone  2.5 % ointment Apply topically 2 (two) times daily. 60 g 3   lamoTRIgine  (LAMICTAL ) 200 MG tablet Take 1 tablet (200 mg total) by mouth daily. 90 tablet 0   methylphenidate  (RITALIN ) 10 MG tablet Take 1 tablet (10 mg total) by mouth 3 (three) times daily with meals. 90 tablet 0   methylphenidate  (RITALIN ) 10 MG tablet Take 1 tablet (10 mg total) by mouth 3 (three) times daily with meals. 90 tablet 0   methylphenidate  (RITALIN ) 10 MG tablet Take 1 tablet (10 mg total) by mouth 3 (three) times daily with meals. 90 tablet 0   methylphenidate  36 MG PO CR tablet Take 1 tablet (36 mg total) by mouth daily. 30 tablet 0   [START ON 06/10/2024] methylphenidate  36 MG PO CR tablet Take 1 tablet (36 mg total) by mouth daily. 30 tablet 0   METHYLPHENIDATE  36 MG PO CR tablet Take 1 tablet (36 mg total) by mouth daily. 30 tablet 0   predniSONE  (STERAPRED UNI-PAK 21 TAB) 10 MG (21) TBPK tablet Take as directed on the  package 21 tablet 0   SYRINGE-NEEDLE, DISP, 3 ML 23G X 1 3 ML MISC Use IM for testosterone  injections 50 each 3   testosterone  cypionate (DEPOTESTOSTERONE CYPIONATE) 200 MG/ML injection INJECT 1.5ML INTO THE MUSCLE ONCE EVERY FOURTEEN DAYS 10 mL 1   traZODone  (DESYREL ) 50 MG tablet TAKE 1 TO 2 TABLETS BY MOUTH at night for sleep 180 tablet 0   No current facility-administered medications for this visit.    Allergies  Allergen Reactions   Nsaids Other (See Comments)    Constipation   Subjective:  Patient presents for today's session.  Patient shared recent events, how he and his parents have had some improvements in their relationship.  He stated that it continues to be centered around their home renovation property.   Patient went on to share significant detail related to the construction crew hired by his parents, they are making several errors while he was able to point out some of this to his mother.  He found it validating as she listen to him as opposed to his feeling unheard of her dismissing his opinions.  He stated it was also validating when he hurt her speak to the general contractor for the job hired where she stated that if you are not for her son the project would not be moving along effectively or as efficiently.  He shared how he has been feeling in 10 to 12-hour workdays trying to complete some tasks and fix some of the problems caused by the construction team.  Provide support and understanding throughout, facilitating his clarifying needs which were primarily focused on his working to complete this project but also acknowledging and attending to his self-care by getting proper rest.  Interventions: Motivational interviewing, supportive therapy  Diagnoses:    ICD-10-CM   1. Attention deficit hyperactivity disorder (ADHD), combined type  F90.2          Plan: Patient is to use CBT, mindfulness and coping skills to help manage / decrease symptoms associated with their diagnosis.   Continue to take steps toward effective communication in his relationship with is parents to keep stress low.     Long-term goal:   Reduce overall level, frequency, and intensity of the feelings of depression, anxiety for at least 3 consecutive months per patient.   Short-term goal:  Verbally express understanding of the relationship between feelings of depression, anxiety and their impact on thinking patterns and behaviors. Verbalize an understanding of the role that distorted thinking plays in creating fears, excessive worry, and ruminations. Patient to utilize coping skills as discussed in session. Patient to identify and utilize effective communication skills with others       Patient to report any concerns with  his medications to his prescribing provider

## 2024-05-26 ENCOUNTER — Other Ambulatory Visit: Payer: Self-pay | Admitting: Psychiatry

## 2024-05-26 DIAGNOSIS — F310 Bipolar disorder, current episode hypomanic: Secondary | ICD-10-CM

## 2024-05-30 ENCOUNTER — Encounter: Payer: Self-pay | Admitting: Radiology

## 2024-06-02 ENCOUNTER — Ambulatory Visit (INDEPENDENT_AMBULATORY_CARE_PROVIDER_SITE_OTHER): Admitting: Mental Health

## 2024-06-02 DIAGNOSIS — F310 Bipolar disorder, current episode hypomanic: Secondary | ICD-10-CM | POA: Diagnosis not present

## 2024-06-02 NOTE — Progress Notes (Unsigned)
 Psychotherapy Note  Name: Francisco Morgan Date: 06/02/2024 MRN: [id number] DOB: 02-09-1976 PCP: Francisco Karlynn GAILS, MD  Session time:  50 minutes  Treatment: Individual therapy  Mental Status Exam: Patient Appearance:    casual  Behavior:   appropriate  Motor:   WNL  Speech/Language:     Clear, coherent  Affect:   Full range  Mood:   euthymic  Thought process:   normal  Thought content:     WNL  Sensory/Perceptual disturbances:     WNL  Orientation:   x4  Attention:   Good  Concentration:   Good  Memory:   WNL  Fund of knowledge:    WNL  Insight:     good  Judgment:    good  Impulse Control:   good   Reported Symptoms:  Decreased sleep intermittently, some anxiety and irritability, ruminations  Risk Assessment: Danger to Self:  No Self-injurious Behavior: No Danger to Others: No Duty to Warn:no Physical Aggression / Violence:No  Access to Firearms a concern: No  Gang Involvement:No  Patient / guardian was educated about steps to take if suicide or homicide risk level increases between visits: yes While future psychiatric events cannot be accurately predicted, the patient does not currently require acute inpatient psychiatric care and does not currently meet [place]  involuntary commitment criteria.  Medications: Current Outpatient Medications  Medication Sig Dispense Refill   Acetylcysteine 600 MG CAPS Take 600 mg by mouth daily.     b complex vitamins tablet Take 1 tablet by mouth daily. 100 tablet 3   celecoxib  (CELEBREX ) 200 MG capsule Take 1 capsule (200 mg total) by mouth 2 (two) times daily. Annual appt due is due must see provider for future refills 60 capsule 5   Cholecalciferol (VITAMIN D ) 2000 units CAPS 1 daily 100 capsule 3   clonazePAM  (KLONOPIN ) 0.5 MG tablet Take 1 tablet (0.5 mg total) by mouth 3 (three) times daily as needed. for anxiety 60 tablet 0   divalproex  (DEPAKOTE ) 500 MG DR tablet Take 5 tablets (2,500 mg total) by mouth at  bedtime. 450 tablet 0   esomeprazole  (NEXIUM ) 40 MG capsule Take 1 capsule (40 mg total) by mouth daily before breakfast. 90 capsule 4   HUMIRA PEN 40 MG/0.4ML PNKT Inject 40 mg into the muscle every 14 (fourteen) days.   1   hydrocortisone  2.5 % ointment Apply topically 2 (two) times daily. 60 g 3   lamoTRIgine  (LAMICTAL ) 200 MG tablet Take 1 tablet (200 mg total) by mouth daily. 90 tablet 1   methylphenidate  (RITALIN ) 10 MG tablet Take 1 tablet (10 mg total) by mouth 3 (three) times daily with meals. 90 tablet 0   methylphenidate  (RITALIN ) 10 MG tablet Take 1 tablet (10 mg total) by mouth 3 (three) times daily with meals. 90 tablet 0   methylphenidate  (RITALIN ) 10 MG tablet Take 1 tablet (10 mg total) by mouth 3 (three) times daily with meals. 90 tablet 0   methylphenidate  36 MG PO CR tablet Take 1 tablet (36 mg total) by mouth daily. 30 tablet 0   [START ON 06/10/2024] methylphenidate  36 MG PO CR tablet Take 1 tablet (36 mg total) by mouth daily. 30 tablet 0   METHYLPHENIDATE  36 MG PO CR tablet Take 1 tablet (36 mg total) by mouth daily. 30 tablet 0   predniSONE  (STERAPRED UNI-PAK 21 TAB) 10 MG (21) TBPK tablet Take as directed on the package 21 tablet 0   SYRINGE-NEEDLE, DISP, 3 ML 23G  X 1 3 ML MISC Use IM for testosterone  injections 50 each 3   testosterone  cypionate (DEPOTESTOSTERONE CYPIONATE) 200 MG/ML injection INJECT 1.5ML INTO THE MUSCLE ONCE EVERY FOURTEEN DAYS 10 mL 1   traZODone  (DESYREL ) 50 MG tablet TAKE 1 TO 2 TABLETS BY MOUTH at night for sleep 180 tablet 0   No current facility-administered medications for this visit.    Allergies  Allergen Reactions   Nsaids Other (See Comments)    Constipation   Subjective:  Patient presents for today's session.  Patient shared recent stressors, namely continuing to work with his parents, have discussions about completing the home renovation project.  He stated they plan to list the home in about 1 week.  He went on to share discussion  specifically with his mother, ways he has tried to set boundaries effectively.  He shared how she often can be provoking, for example her coming to his room waking him up abruptly in the morning to assist with a project for the house renovation.  He stated that he had no advance warning, how she makes plans without consulting him and communicates often in a very directive or judgmental manner.  He shared how he tries to avoid her at times such as a recent situation where he was in the family kitchen only for her to be critical of something he was preparing.  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  He reflected on the extensive work, time and money he is invested in the renovation project where he has insight into how he would do things differently and does not plan to engage in a joint venture such as this with his parents in the future.   Interventions: Motivational interviewing, supportive therapy  Diagnoses:    ICD-10-CM   1. Bipolar I disorder, current or most recent episode hypomanic (HCC)  F31.0           Plan: Patient is to use CBT, mindfulness and coping skills to help manage / decrease symptoms associated with their diagnosis.   Continue to take steps toward effective communication in his relationship with is parents to keep stress low.     Long-term goal:   Reduce overall level, frequency, and intensity of the feelings of depression, anxiety for at least 3 consecutive months per patient.   Short-term goal:  Verbally express understanding of the relationship between feelings of depression, anxiety and their impact on thinking patterns and behaviors. Verbalize an understanding of the role that distorted thinking plays in creating fears, excessive worry, and ruminations. Patient to utilize coping skills as discussed in session. Patient to identify and utilize effective communication skills with others        Patient to report any concerns with his medications to his prescribing provider

## 2024-06-11 ENCOUNTER — Other Ambulatory Visit: Payer: Self-pay | Admitting: Internal Medicine

## 2024-06-16 ENCOUNTER — Telehealth: Payer: Self-pay | Admitting: Psychiatry

## 2024-06-16 ENCOUNTER — Ambulatory Visit: Admitting: Mental Health

## 2024-06-16 DIAGNOSIS — F310 Bipolar disorder, current episode hypomanic: Secondary | ICD-10-CM

## 2024-06-16 NOTE — Telephone Encounter (Signed)
 05/19/2024 05/18/2024 1  Methylphenidate  10 Mg Tablet 90.00 30 Ca Cot [id number] Nor ([id number]) 0/0  Comm Ins [place] 05/16/2024 04/15/2024 1  Methylphenidate  Er 36 Mg Tab 30.00 30

## 2024-06-16 NOTE — Progress Notes (Signed)
 Psychotherapy Note  Name: Francisco Morgan Date: 06/16/2024 MRN: [id number] DOB: March 20, 1976 PCP: Garald Karlynn GAILS, MD  Session time:  51 minutes  Treatment: Individual therapy  Mental Status Exam: Patient Appearance:    casual  Behavior:   appropriate  Motor:   WNL  Speech/Language:     Clear, coherent  Affect:   Full range  Mood:   euthymic  Thought process:   normal  Thought content:     WNL  Sensory/Perceptual disturbances:     WNL  Orientation:   x4  Attention:   Good  Concentration:   Good  Memory:   WNL  Fund of knowledge:    WNL  Insight:     good  Judgment:    good  Impulse Control:   good   Reported Symptoms:  Decreased sleep intermittently, some anxiety and irritability, ruminations  Risk Assessment: Danger to Self:  No Self-injurious Behavior: No Danger to Others: No Duty to Warn:no Physical Aggression / Violence:No  Access to Firearms a concern: No  Gang Involvement:No  Patient / guardian was educated about steps to take if suicide or homicide risk level increases between visits: yes While future psychiatric events cannot be accurately predicted, the patient does not currently require acute inpatient psychiatric care and does not currently meet   involuntary commitment criteria.  Medications: Current Outpatient Medications  Medication Sig Dispense Refill   Acetylcysteine 600 MG CAPS Take 600 mg by mouth daily.     b complex vitamins tablet Take 1 tablet by mouth daily. 100 tablet 3   celecoxib  (CELEBREX ) 200 MG capsule Take 1 capsule (200 mg total) by mouth 2 (two) times daily. Annual appt due is due must see provider for future refills 60 capsule 5   Cholecalciferol (VITAMIN D ) 2000 units CAPS 1 daily 100 capsule 3   clonazePAM  (KLONOPIN ) 0.5 MG tablet Take 1 tablet (0.5 mg total) by mouth 3 (three) times daily as needed. for anxiety 60 tablet 0   divalproex  (DEPAKOTE ) 500 MG DR tablet Take 5 tablets (2,500 mg total) by mouth at  bedtime. 450 tablet 0   esomeprazole  (NEXIUM ) 40 MG capsule Take 1 capsule (40 mg total) by mouth daily before breakfast. 90 capsule 4   HUMIRA PEN 40 MG/0.4ML PNKT Inject 40 mg into the muscle every 14 (fourteen) days.   1   hydrocortisone  2.5 % ointment Apply topically 2 (two) times daily. 60 g 3   lamoTRIgine  (LAMICTAL ) 200 MG tablet Take 1 tablet (200 mg total) by mouth daily. 90 tablet 1   methylphenidate  (RITALIN ) 10 MG tablet Take 1 tablet (10 mg total) by mouth 3 (three) times daily with meals. 90 tablet 0   methylphenidate  (RITALIN ) 10 MG tablet Take 1 tablet (10 mg total) by mouth 3 (three) times daily with meals. 90 tablet 0   methylphenidate  (RITALIN ) 10 MG tablet Take 1 tablet (10 mg total) by mouth 3 (three) times daily with meals. 90 tablet 0   methylphenidate  36 MG PO CR tablet Take 1 tablet (36 mg total) by mouth daily. 30 tablet 0   methylphenidate  36 MG PO CR tablet Take 1 tablet (36 mg total) by mouth daily. 30 tablet 0   METHYLPHENIDATE  36 MG PO CR tablet Take 1 tablet (36 mg total) by mouth daily. 30 tablet 0   predniSONE  (STERAPRED UNI-PAK 21 TAB) 10 MG (21) TBPK tablet Take as directed on the package 21 tablet 0   SYRINGE-NEEDLE, DISP, 3 ML 23G X 1 3  ML MISC Use IM for testosterone  injections 50 each 3   testosterone  cypionate (DEPOTESTOSTERONE CYPIONATE) 200 MG/ML injection INJECT 1.5ML INTO THE MUSCLE ONCE EVERY FOURTEEN DAYS 10 mL 1   traZODone  (DESYREL ) 50 MG tablet TAKE 1 TO 2 TABLETS BY MOUTH at night for sleep 180 tablet 0   No current facility-administered medications for this visit.    Allergies  Allergen Reactions   Nsaids Other (See Comments)    Constipation   Subjective:  Patient presents for today's session.  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  He expressed optimism and appeared motivated to reengage in this career for himself.  He continues to work with his  parents on the home renovation project, stating they will probably have the house ready for sale in the next 1 to 2 weeks.  He went on to share a particularly difficult argument that he had with his mother due to her tendency to be critical towards him, raise her voice which she states has been a chronic issue.  He identified the need to set clear boundaries as a way to cope and care for himself.  He stated that she made some traditional statements related to telling him he would have to move out of the home.  Explored alternatives, setting boundaries verbally to avoid further discussion if she tends to escalate and also allow himself to walk away from those situations.  He stated that he continues to make an effort in this way, how she often will resurface the argument later that day or even days later with the same anger.     Interventions: Motivational interviewing, supportive therapy  Diagnoses:    ICD-10-CM   1. Bipolar I disorder, current or most recent episode hypomanic (HCC)  F31.0            Plan: Patient is to use CBT, mindfulness and coping skills to help manage / decrease symptoms associated with their diagnosis.   Continue to take steps toward effective communication in his relationship with is parents to keep stress low.     Long-term goal:   Reduce overall level, frequency, and intensity of the feelings of depression, anxiety for at least 3 consecutive months per patient.   Short-term goal:  Verbally express understanding of the relationship between feelings of depression, anxiety and their impact on thinking patterns and behaviors. Verbalize an understanding of the role that distorted thinking plays in creating fears, excessive worry, and ruminations. Patient to utilize coping skills as discussed in session. Patient to identify and utilize effective communication skills with others       Patient to report any concerns with his medications to his prescribing provider

## 2024-06-16 NOTE — Telephone Encounter (Signed)
 Patient came into to see [place] and requested refill on Concerta  36mg  and Ritalin  10mg . Ph: [phone]  Appt 2/23 Pharmacy [facility] [address] [place]

## 2024-06-17 ENCOUNTER — Other Ambulatory Visit: Payer: Self-pay

## 2024-06-17 DIAGNOSIS — F902 Attention-deficit hyperactivity disorder, combined type: Secondary | ICD-10-CM

## 2024-06-17 MED ORDER — METHYLPHENIDATE HCL 10 MG PO TABS: 10.0000 mg | ORAL_TABLET | Freq: Three times a day (TID) | ORAL | 0 refills | Status: DC

## 2024-06-17 MED ORDER — METHYLPHENIDATE HCL ER (OSM) 36 MG PO TBCR: 36.0000 mg | EXTENDED_RELEASE_TABLET | Freq: Every day | ORAL | 0 refills | Status: AC

## 2024-06-17 NOTE — Telephone Encounter (Signed)
 Pended both 10 mg and 36 mg to [facility]

## 2024-06-21 ENCOUNTER — Ambulatory Visit: Admitting: Internal Medicine

## 2024-06-27 ENCOUNTER — Other Ambulatory Visit: Payer: Self-pay | Admitting: Psychiatry

## 2024-06-27 DIAGNOSIS — F310 Bipolar disorder, current episode hypomanic: Secondary | ICD-10-CM

## 2024-06-28 ENCOUNTER — Telehealth: Payer: Self-pay | Admitting: Psychiatry

## 2024-06-28 NOTE — Telephone Encounter (Signed)
 Pharmacy lvm at 10:18 regarding Francisco Morgan's Divalproex  500mg  prescription. States that the manufacturer they were using is no longer available and they have filled with a different one. Just an fyi if labs were different. Ph: [phone]

## 2024-06-28 NOTE — Telephone Encounter (Signed)
 Called pharmacy to see who the new manufacturer is for Depakote  and it is [employer].

## 2024-06-30 ENCOUNTER — Ambulatory Visit: Admitting: Mental Health

## 2024-06-30 NOTE — Progress Notes (Signed)
 error

## 2024-07-13 ENCOUNTER — Other Ambulatory Visit: Payer: Self-pay

## 2024-07-13 ENCOUNTER — Telehealth: Payer: Self-pay | Admitting: Psychiatry

## 2024-07-13 DIAGNOSIS — F902 Attention-deficit hyperactivity disorder, combined type: Secondary | ICD-10-CM

## 2024-07-13 NOTE — Telephone Encounter (Signed)
 Jaceion lvm that he needs refill on his ritalin  10 mg quantity of 90. He also needs his concerta   xr 36 mg refilled as well. [facility]acy is [facility] [address]

## 2024-07-14 ENCOUNTER — Other Ambulatory Visit: Payer: Self-pay

## 2024-07-14 ENCOUNTER — Ambulatory Visit: Admitting: Mental Health

## 2024-07-14 DIAGNOSIS — F902 Attention-deficit hyperactivity disorder, combined type: Secondary | ICD-10-CM | POA: Diagnosis not present

## 2024-07-14 NOTE — Telephone Encounter (Signed)
 LF both doses 11/21

## 2024-07-14 NOTE — Telephone Encounter (Signed)
 Francisco Morgan lvm today to f/u on call from yesterday on his refills. Fill at the [facility] location.

## 2024-07-14 NOTE — Progress Notes (Signed)
 Psychotherapy Note  Name: Francisco Morgan Date: 07/14/2024 MRN: [id number] DOB: Nov 15, 1975 PCP: Francisco Karlynn GAILS, MD  Session time:  51 minutes  Treatment: Individual therapy  Mental Status Exam: Patient Appearance:    casual  Behavior:   appropriate  Motor:   WNL  Speech/Language:     Clear, coherent  Affect:   Full range  Mood:   euthymic  Thought process:   normal  Thought content:     WNL  Sensory/Perceptual disturbances:     WNL  Orientation:   x4  Attention:   Good  Concentration:   Good  Memory:   WNL  Fund of knowledge:    WNL  Insight:     good  Judgment:    good  Impulse Control:   good   Reported Symptoms:  Decreased sleep intermittently, some anxiety and irritability, ruminations  Risk Assessment: Danger to Self:  No Self-injurious Behavior: No Danger to Others: No Duty to Warn:no Physical Aggression / Violence:No  Access to Firearms a concern: No  Gang Involvement:No  Patient / guardian was educated about steps to take if suicide or homicide risk level increases between visits: yes While future psychiatric events cannot be accurately predicted, the patient does not currently require acute inpatient psychiatric care and does not currently meet [place]  involuntary commitment criteria.  Medications: Current Outpatient Medications  Medication Sig Dispense Refill   Acetylcysteine 600 MG CAPS Take 600 mg by mouth daily.     b complex vitamins tablet Take 1 tablet by mouth daily. 100 tablet 3   celecoxib  (CELEBREX ) 200 MG capsule Take 1 capsule (200 mg total) by mouth 2 (two) times daily. Annual appt due is due must see provider for future refills 60 capsule 5   Cholecalciferol (VITAMIN D ) 2000 units CAPS 1 daily 100 capsule 3   clonazePAM  (KLONOPIN ) 0.5 MG tablet Take 1 tablet (0.5 mg total) by mouth 3 (three) times daily as needed. for anxiety 60 tablet 0   divalproex  (DEPAKOTE ) 500 MG DR tablet Take 5 tablets (2,500 mg total) by mouth at  bedtime. 450 tablet 0   esomeprazole  (NEXIUM ) 40 MG capsule Take 1 capsule (40 mg total) by mouth daily before breakfast. 90 capsule 4   HUMIRA PEN 40 MG/0.4ML PNKT Inject 40 mg into the muscle every 14 (fourteen) days.   1   hydrocortisone  2.5 % ointment Apply topically 2 (two) times daily. 60 g 3   lamoTRIgine  (LAMICTAL ) 200 MG tablet Take 1 tablet (200 mg total) by mouth daily. 90 tablet 1   methylphenidate  (RITALIN ) 10 MG tablet Take 1 tablet (10 mg total) by mouth 3 (three) times daily with meals. 90 tablet 0   methylphenidate  (RITALIN ) 10 MG tablet Take 1 tablet (10 mg total) by mouth 3 (three) times daily with meals. 90 tablet 0   methylphenidate  (RITALIN ) 10 MG tablet Take 1 tablet (10 mg total) by mouth 3 (three) times daily with meals. 90 tablet 0   methylphenidate  36 MG PO CR tablet Take 1 tablet (36 mg total) by mouth daily. 30 tablet 0   METHYLPHENIDATE  36 MG PO CR tablet Take 1 tablet (36 mg total) by mouth daily. 30 tablet 0   methylphenidate  36 MG PO CR tablet Take 1 tablet (36 mg total) by mouth daily. 30 tablet 0   predniSONE  (STERAPRED UNI-PAK 21 TAB) 10 MG (21) TBPK tablet Take as directed on the package 21 tablet 0   SYRINGE-NEEDLE, DISP, 3 ML 23G X 1 3  ML MISC Use IM for testosterone  injections 50 each 3   testosterone  cypionate (DEPOTESTOSTERONE CYPIONATE) 200 MG/ML injection INJECT 1.5ML INTO THE MUSCLE ONCE EVERY FOURTEEN DAYS 10 mL 1   traZODone  (DESYREL ) 50 MG tablet TAKE 1 TO 2 TABLETS BY MOUTH at night for sleep 180 tablet 0   No current facility-administered medications for this visit.    Allergies  Allergen Reactions   Nsaids Other (See Comments)    Constipation   Subjective:  Patient presents for today's session.  Assessed progress where patient shared how the primary listing for the family is now listed.  He stated that they had some recent interest but close encouraging and hopes that the house potentially will close about 1 month.  He shared a recent  changes with relational dynamics with his parents, most notably his mother.  He stated that she has been's somewhat more pleasant, inviting him to dinner 1 evening.  This being a contrast as he often will go in the kitchen where she might be critical if he is making food, although he states he buys his own food.  He stated that his mother can be hard to deal with when she is stressed, he has noticed a change since some of the main task to get the house ready for sale have decreased.  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   Interventions: Motivational interviewing, supportive therapy  Diagnoses:    ICD-10-CM   1. Attention deficit hyperactivity disorder (ADHD), combined type  F90.2         Plan: Patient is to use CBT, mindfulness and coping skills to help manage / decrease symptoms associated with their diagnosis.   Continue to take steps toward effective communication in his relationship with is parents to keep stress low.     Long-term goal:   Reduce overall level, frequency, and intensity of the feelings of depression, anxiety for at least 3 consecutive months per patient.   Short-term goal:  Verbally express understanding of the relationship between feelings of depression, anxiety and their impact on thinking patterns and behaviors. Verbalize an understanding of the role that distorted thinking plays in creating fears, excessive worry, and ruminations. Patient to utilize coping skills as discussed in session. Patient to identify and utilize effective communication skills with others       Patient to report any concerns with his medications to his prescribing provider

## 2024-07-14 NOTE — Telephone Encounter (Addendum)
 LF 11/2, RF not due until 12/19. Pt notified. He said he knew, didn't want to wait to last minute.

## 2024-07-15 MED ORDER — METHYLPHENIDATE HCL ER (OSM) 36 MG PO TBCR: 36.0000 mg | EXTENDED_RELEASE_TABLET | Freq: Every day | ORAL | 0 refills | Status: AC

## 2024-07-15 MED ORDER — METHYLPHENIDATE HCL 10 MG PO TABS: 10.0000 mg | ORAL_TABLET | Freq: Three times a day (TID) | ORAL | 0 refills | Status: AC

## 2024-07-15 NOTE — Telephone Encounter (Signed)
 Pended both 10 mg and 36 mg methylphenidate .

## 2024-07-26 ENCOUNTER — Other Ambulatory Visit: Payer: Self-pay | Admitting: Psychiatry

## 2024-07-26 DIAGNOSIS — F4310 Post-traumatic stress disorder, unspecified: Secondary | ICD-10-CM

## 2024-08-04 ENCOUNTER — Ambulatory Visit (INDEPENDENT_AMBULATORY_CARE_PROVIDER_SITE_OTHER): Admitting: Mental Health

## 2024-08-04 DIAGNOSIS — F310 Bipolar disorder, current episode hypomanic: Secondary | ICD-10-CM | POA: Diagnosis not present

## 2024-08-04 DIAGNOSIS — F902 Attention-deficit hyperactivity disorder, combined type: Secondary | ICD-10-CM

## 2024-08-04 NOTE — Progress Notes (Signed)
 Psychotherapy Note  Name: Francisco Morgan Date: 08/05/2023 MRN: [id number] DOB: 05-07-1976 PCP: Garald Karlynn GAILS, MD  Session time:  50 minutes  Treatment: Individual therapy  Mental Status Exam: Patient Appearance:    casual  Behavior:   appropriate  Motor:   WNL  Speech/Language:     Clear, coherent  Affect:   Full range  Mood:   euthymic  Thought process:   normal  Thought content:     WNL  Sensory/Perceptual disturbances:     WNL  Orientation:   x4  Attention:   Good  Concentration:   Good  Memory:   WNL  Fund of knowledge:    WNL  Insight:     good  Judgment:    good  Impulse Control:   good   Reported Symptoms:  Decreased sleep intermittently, some anxiety and irritability, ruminations  Risk Assessment: Danger to Self:  No Self-injurious Behavior: No Danger to Others: No Duty to Warn:no Physical Aggression / Violence:No  Access to Firearms a concern: No  Gang Involvement:No  Patient / guardian was educated about steps to take if suicide or homicide risk level increases between visits: yes While future psychiatric events cannot be accurately predicted, the patient does not currently require acute inpatient psychiatric care and does not currently meet [place]  involuntary commitment criteria.  Medications: Current Outpatient Medications  Medication Sig Dispense Refill   Acetylcysteine 600 MG CAPS Take 600 mg by mouth daily.     b complex vitamins tablet Take 1 tablet by mouth daily. 100 tablet 3   celecoxib  (CELEBREX ) 200 MG capsule Take 1 capsule (200 mg total) by mouth 2 (two) times daily. Annual appt due is due must see provider for future refills 60 capsule 5   Cholecalciferol (VITAMIN D ) 2000 units CAPS 1 daily 100 capsule 3   clonazePAM  (KLONOPIN ) 0.5 MG tablet Take 1 tablet (0.5 mg total) by mouth 3 (three) times daily as needed. for anxiety 60 tablet 1   divalproex  (DEPAKOTE ) 500 MG DR tablet Take 5 tablets (2,500 mg total) by mouth at bedtime.  450 tablet 0   esomeprazole  (NEXIUM ) 40 MG capsule Take 1 capsule (40 mg total) by mouth daily before breakfast. 90 capsule 4   HUMIRA PEN 40 MG/0.4ML PNKT Inject 40 mg into the muscle every 14 (fourteen) days.   1   hydrocortisone  2.5 % ointment Apply topically 2 (two) times daily. 60 g 3   lamoTRIgine  (LAMICTAL ) 200 MG tablet Take 1 tablet (200 mg total) by mouth daily. 90 tablet 1   methylphenidate  (CONCERTA ) 36 MG PO CR tablet Take 1 tablet (36 mg total) by mouth daily before breakfast. 30 tablet 0   [START ON 08/11/2024] methylphenidate  (CONCERTA ) 36 MG PO CR tablet Take 1 tablet (36 mg total) by mouth daily before breakfast. 30 tablet 0   [START ON 09/08/2024] methylphenidate  (CONCERTA ) 36 MG PO CR tablet Take 1 tablet (36 mg total) by mouth daily before breakfast. 30 tablet 0   [START ON 09/08/2024] methylphenidate  (RITALIN ) 10 MG tablet Take 1 tablet (10 mg total) by mouth 3 (three) times daily with meals. 90 tablet 0   [START ON 08/11/2024] methylphenidate  (RITALIN ) 10 MG tablet Take 1 tablet (10 mg total) by mouth 3 (three) times daily with meals. 90 tablet 0   methylphenidate  (RITALIN ) 10 MG tablet Take 1 tablet (10 mg total) by mouth 3 (three) times daily with meals. 90 tablet 0   methylphenidate  36 MG PO CR tablet Take 1  tablet (36 mg total) by mouth daily. 30 tablet 0   METHYLPHENIDATE  36 MG PO CR tablet Take 1 tablet (36 mg total) by mouth daily. 30 tablet 0   methylphenidate  36 MG PO CR tablet Take 1 tablet (36 mg total) by mouth daily. 30 tablet 0   predniSONE  (STERAPRED UNI-PAK 21 TAB) 10 MG (21) TBPK tablet Take as directed on the package 21 tablet 0   SYRINGE-NEEDLE, DISP, 3 ML 23G X 1 3 ML MISC Use IM for testosterone  injections 50 each 3   testosterone  cypionate (DEPOTESTOSTERONE CYPIONATE) 200 MG/ML injection INJECT 1.5ML INTO THE MUSCLE ONCE EVERY FOURTEEN DAYS 10 mL 1   traZODone  (DESYREL ) 50 MG tablet TAKE 1 TO 2 TABLETS BY MOUTH at night for sleep 180 tablet 0   No current  facility-administered medications for this visit.    Allergies  Allergen Reactions   Nsaids Other (See Comments)    Constipation   Subjective:  Patient presents for today's session.  Assessed progress where patient shared how he spent time at home over New Year's Eve, he typically likes to be social.  He went on to share history related to different jobs he is having but also his social life [place] but also as he got older when he lived in [place].  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 Patient shared how they are close to closing on the family renovation property.  He reports less challenges in the relationship with his parents, with his mother but some disagreements intermittently.  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  Patient shared how he followed through with making the repairs to assist with selling the property.  Interventions: Motivational interviewing, supportive therapy  Diagnoses:    ICD-10-CM   1. Attention deficit hyperactivity disorder (ADHD), combined type  F90.2     2. Bipolar I disorder, current or most recent episode hypomanic (HCC)  F31.0          Plan: Patient is to use CBT, mindfulness and coping skills to help manage / decrease symptoms associated with their diagnosis.   Continue to take steps toward effective communication in his relationship with is parents to keep stress low.     Long-term goal:   Reduce overall level, frequency, and intensity of the feelings of depression, anxiety for at least 3 consecutive months per patient.   Short-term goal:  Verbally express understanding of the relationship between feelings of depression, anxiety and their impact on  thinking patterns and behaviors. Verbalize an understanding of the role that distorted thinking plays in creating fears, excessive worry, and ruminations. Patient to utilize coping skills as discussed in session. Patient to identify and utilize effective communication skills with others       Patient to report any concerns with his medications to his prescribing provider

## 2024-08-15 ENCOUNTER — Telehealth: Payer: Self-pay | Admitting: Psychiatry

## 2024-08-15 NOTE — Telephone Encounter (Signed)
 LVM for pt as per DPR, he has RF at [facility] [address]

## 2024-08-15 NOTE — Telephone Encounter (Signed)
 Next visit is 09/19/24. Requesting refills on Methylphenidate  10 mg and Concerta  30 mg called to:  [facility], [address], [place], [place] [address],  Phone number is [phone].

## 2024-08-18 ENCOUNTER — Ambulatory Visit (INDEPENDENT_AMBULATORY_CARE_PROVIDER_SITE_OTHER): Admitting: Mental Health

## 2024-08-18 DIAGNOSIS — F310 Bipolar disorder, current episode hypomanic: Secondary | ICD-10-CM | POA: Diagnosis not present

## 2024-08-18 NOTE — Progress Notes (Signed)
 Psychotherapy Note  Name: Francisco Morgan Date: 08/19/2023 MRN: [id number] DOB: [birth year] PCP: Garald Karlynn GAILS, MD  Session time:  50 minutes   Treatment: Individual therapy  Mental Status Exam: Patient Appearance:    casual  Behavior:   appropriate  Motor:   WNL  Speech/Language:     Clear, coherent  Affect:   Full range  Mood:   euthymic  Thought process:   normal  Thought content:     WNL  Sensory/Perceptual disturbances:     WNL  Orientation:   x4  Attention:   Good  Concentration:   Good  Memory:   WNL  Fund of knowledge:    WNL  Insight:     good  Judgment:    good  Impulse Control:   good   Reported Symptoms:  Decreased sleep intermittently, some anxiety and irritability, ruminations  Risk Assessment: Danger to Self:  No Self-injurious Behavior: No Danger to Others: No Duty to Warn:no Physical Aggression / Violence:No  Access to Firearms a concern: No  Gang Involvement:No  Patient / guardian was educated about steps to take if suicide or homicide risk level increases between visits: yes While future psychiatric events cannot be accurately predicted, the patient does not currently require acute inpatient psychiatric care and does not currently meet [place]  involuntary commitment criteria.  Medications: Current Outpatient Medications  Medication Sig Dispense Refill   Acetylcysteine 600 MG CAPS Take 600 mg by mouth daily.     b complex vitamins tablet Take 1 tablet by mouth daily. 100 tablet 3   celecoxib  (CELEBREX ) 200 MG capsule Take 1 capsule (200 mg total) by mouth 2 (two) times daily. Annual appt due is due must see provider for future refills 60 capsule 5   Cholecalciferol (VITAMIN D ) 2000 units CAPS 1 daily 100 capsule 3   clonazePAM  (KLONOPIN ) 0.5 MG tablet Take 1 tablet (0.5 mg total) by mouth 3 (three) times daily as needed. for anxiety 60 tablet 1   divalproex  (DEPAKOTE ) 500 MG DR tablet Take 5 tablets (2,500 mg total) by mouth at  bedtime. 450 tablet 0   esomeprazole  (NEXIUM ) 40 MG capsule Take 1 capsule (40 mg total) by mouth daily before breakfast. 90 capsule 4   HUMIRA PEN 40 MG/0.4ML PNKT Inject 40 mg into the muscle every 14 (fourteen) days.   1   hydrocortisone  2.5 % ointment Apply topically 2 (two) times daily. 60 g 3   lamoTRIgine  (LAMICTAL ) 200 MG tablet Take 1 tablet (200 mg total) by mouth daily. 90 tablet 1   methylphenidate  (CONCERTA ) 36 MG PO CR tablet Take 1 tablet (36 mg total) by mouth daily before breakfast. 30 tablet 0   methylphenidate  (CONCERTA ) 36 MG PO CR tablet Take 1 tablet (36 mg total) by mouth daily before breakfast. 30 tablet 0   [START ON 09/08/2024] methylphenidate  (CONCERTA ) 36 MG PO CR tablet Take 1 tablet (36 mg total) by mouth daily before breakfast. 30 tablet 0   [START ON 09/08/2024] methylphenidate  (RITALIN ) 10 MG tablet Take 1 tablet (10 mg total) by mouth 3 (three) times daily with meals. 90 tablet 0   methylphenidate  (RITALIN ) 10 MG tablet Take 1 tablet (10 mg total) by mouth 3 (three) times daily with meals. 90 tablet 0   methylphenidate  (RITALIN ) 10 MG tablet Take 1 tablet (10 mg total) by mouth 3 (three) times daily with meals. 90 tablet 0   methylphenidate  36 MG PO CR tablet Take 1 tablet (36 mg total) by  mouth daily. 30 tablet 0   METHYLPHENIDATE  36 MG PO CR tablet Take 1 tablet (36 mg total) by mouth daily. 30 tablet 0   methylphenidate  36 MG PO CR tablet Take 1 tablet (36 mg total) by mouth daily. 30 tablet 0   predniSONE  (STERAPRED UNI-PAK 21 TAB) 10 MG (21) TBPK tablet Take as directed on the package 21 tablet 0   SYRINGE-NEEDLE, DISP, 3 ML 23G X 1 3 ML MISC Use IM for testosterone  injections 50 each 3   testosterone  cypionate (DEPOTESTOSTERONE CYPIONATE) 200 MG/ML injection INJECT 1.5ML INTO THE MUSCLE ONCE EVERY FOURTEEN DAYS 10 mL 1   traZODone  (DESYREL ) 50 MG tablet TAKE 1 TO 2 TABLETS BY MOUTH at night for sleep 180 tablet 0   No current facility-administered medications  for this visit.    Allergies  Allergen Reactions   Nsaids Other (See Comments)    Constipation   Subjective:  Patient presents for today's session.  Patient shared recent events, most notably that his parents investment him recently sold yesterday.  He stated that he continues to be difficult in the relationship with his mother, is going on to share some content related to the message she sent him recently where he stated she can be critical of him, asking repetitive questions.  He stated she will often ask questions he is already given the answer to as he stated that she often will question him and tried to seek out other answers which makes him feel a sense of invalidation.  He shared how he often makes efforts to ensure that the sale of the home goes smoothly, sharing some of the complexities of the process.  Facilitated ways he feels he could handle it at this point where he does not plan to continue to communicate with her but get rest as he stated that he has been getting minimal sleep over the past few days.  Support and encouragement for him to focus on self-care, provide getting enough rest with consistency.   Interventions: Motivational interviewing, supportive therapy  Diagnoses:  No diagnosis found.      Plan: Patient is to use CBT, mindfulness and coping skills to help manage / decrease symptoms associated with their diagnosis.   Continue to take steps toward effective communication in his relationship with is parents to keep stress low.     Long-term goal:   Reduce overall level, frequency, and intensity of the feelings of depression, anxiety for at least 3 consecutive months per patient.   Short-term goal:  Verbally express understanding of the relationship between feelings of depression, anxiety and their impact on thinking patterns and behaviors. Verbalize an understanding of the role that distorted thinking plays in creating fears, excessive worry, and  ruminations. Patient to utilize coping skills as discussed in session. Patient to identify and utilize effective communication skills with others       Patient to report any concerns with his medications to his prescribing provider

## 2024-09-01 ENCOUNTER — Ambulatory Visit: Admitting: Mental Health

## 2024-09-01 DIAGNOSIS — F310 Bipolar disorder, current episode hypomanic: Secondary | ICD-10-CM

## 2024-09-01 NOTE — Progress Notes (Signed)
 Psychotherapy Note  Name: Francisco Morgan Date: 09/01/24 MRN: [id number] DOB: 05-Jan-1976 PCP: Garald Karlynn GAILS, MD  Session time:  51 minutes   Treatment: Individual therapy  Mental Status Exam: Patient Appearance:    casual  Behavior:   appropriate  Motor:   WNL  Speech/Language:     Clear, coherent  Affect:   Full range  Mood:   euthymic  Thought process:   normal  Thought content:     WNL  Sensory/Perceptual disturbances:     WNL  Orientation:   x4  Attention:   Good  Concentration:   Good  Memory:   WNL  Fund of knowledge:    WNL  Insight:     good  Judgment:    good  Impulse Control:   good   Reported Symptoms:  Decreased sleep intermittently, some anxiety and irritability, ruminations  Risk Assessment: Danger to Self:  No Self-injurious Behavior: No Danger to Others: No Duty to Warn:no Physical Aggression / Violence:No  Access to Firearms a concern: No  Gang Involvement:No  Patient / guardian was educated about steps to take if suicide or homicide risk level increases between visits: yes While future psychiatric events cannot be accurately predicted, the patient does not currently require acute inpatient psychiatric care and does not currently meet [facility]  involuntary commitment criteria.  Medications: Current Outpatient Medications  Medication Sig Dispense Refill   Acetylcysteine 600 MG CAPS Take 600 mg by mouth daily.     b complex vitamins tablet Take 1 tablet by mouth daily. 100 tablet 3   celecoxib  (CELEBREX ) 200 MG capsule Take 1 capsule (200 mg total) by mouth 2 (two) times daily. Annual appt due is due must see provider for future refills 60 capsule 5   Cholecalciferol (VITAMIN D ) 2000 units CAPS 1 daily 100 capsule 3   clonazePAM  (KLONOPIN ) 0.5 MG tablet Take 1 tablet (0.5 mg total) by mouth 3 (three) times daily as needed. for anxiety 60 tablet 1   divalproex  (DEPAKOTE ) 500 MG DR tablet Take 5 tablets (2,500 mg total) by mouth at bedtime.  450 tablet 0   esomeprazole  (NEXIUM ) 40 MG capsule Take 1 capsule (40 mg total) by mouth daily before breakfast. 90 capsule 4   HUMIRA PEN 40 MG/0.4ML PNKT Inject 40 mg into the muscle every 14 (fourteen) days.   1   hydrocortisone  2.5 % ointment Apply topically 2 (two) times daily. 60 g 3   lamoTRIgine  (LAMICTAL ) 200 MG tablet Take 1 tablet (200 mg total) by mouth daily. 90 tablet 1   methylphenidate  (CONCERTA ) 36 MG PO CR tablet Take 1 tablet (36 mg total) by mouth daily before breakfast. 30 tablet 0   methylphenidate  (CONCERTA ) 36 MG PO CR tablet Take 1 tablet (36 mg total) by mouth daily before breakfast. 30 tablet 0   [START ON 09/08/2024] methylphenidate  (CONCERTA ) 36 MG PO CR tablet Take 1 tablet (36 mg total) by mouth daily before breakfast. 30 tablet 0   [START ON 09/08/2024] methylphenidate  (RITALIN ) 10 MG tablet Take 1 tablet (10 mg total) by mouth 3 (three) times daily with meals. 90 tablet 0   methylphenidate  (RITALIN ) 10 MG tablet Take 1 tablet (10 mg total) by mouth 3 (three) times daily with meals. 90 tablet 0   methylphenidate  (RITALIN ) 10 MG tablet Take 1 tablet (10 mg total) by mouth 3 (three) times daily with meals. 90 tablet 0   methylphenidate  36 MG PO CR tablet Take 1 tablet (36 mg total) by  mouth daily. 30 tablet 0   METHYLPHENIDATE  36 MG PO CR tablet Take 1 tablet (36 mg total) by mouth daily. 30 tablet 0   methylphenidate  36 MG PO CR tablet Take 1 tablet (36 mg total) by mouth daily. 30 tablet 0   predniSONE  (STERAPRED UNI-PAK 21 TAB) 10 MG (21) TBPK tablet Take as directed on the package 21 tablet 0   SYRINGE-NEEDLE, DISP, 3 ML 23G X 1 3 ML MISC Use IM for testosterone  injections 50 each 3   testosterone  cypionate (DEPOTESTOSTERONE CYPIONATE) 200 MG/ML injection INJECT 1.5ML INTO THE MUSCLE ONCE EVERY FOURTEEN DAYS 10 mL 1   traZODone  (DESYREL ) 50 MG tablet TAKE 1 TO 2 TABLETS BY MOUTH at night for sleep 180 tablet 0   No current facility-administered medications for this  visit.    Allergies  Allergen Reactions   Nsaids Other (See Comments)    Constipation   Subjective:  Patient presents for today's session.  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  He went on to share more relevant history related to his personal life specific to dating.  He shared many details related to a girl with which he had intentions to marry.  He went on to share how this relationship lasted about 5 years, how they both were committed but, due to life changes, moving and other issues the relationship eventually dissolved.  He stated that he had another relationship that lasted about 2 years which ended for a variety of reasons, 1 catalyst being his having a manic episode, one of his first experienced in the relationship where he stated she was able to get a more clear understanding the experience of what his mental health challenges were at the time.  He stated this was prior to having a more stable medication table.  Provide support, understanding and facilitated his for clarifying experiences and how they impacted his life at that time.  Interventions: Motivational interviewing, supportive therapy  Diagnoses:    ICD-10-CM   1. Bipolar I disorder, current or most recent episode hypomanic (HCC)  F31.0           Plan: Patient is to use CBT, mindfulness and coping skills to help manage / decrease symptoms associated with their diagnosis.   Continue to take steps toward effective communication in his relationship with is parents to keep stress low.     Long-term goal:   Reduce overall level, frequency, and intensity of the feelings of depression, anxiety for at least 3 consecutive months per patient.   Short-term goal:  Verbally express understanding of the relationship between feelings of depression, anxiety and their impact on thinking patterns and behaviors. Verbalize an understanding  of the role that distorted thinking plays in creating fears, excessive worry, and ruminations. Patient to utilize coping skills as discussed in session. Patient to identify and utilize effective communication skills with others       Patient to report any concerns with his medications to his prescribing provider

## 2024-09-15 ENCOUNTER — Ambulatory Visit: Admitting: Mental Health

## 2024-09-19 ENCOUNTER — Ambulatory Visit: Admitting: Psychiatry

## 2024-09-29 ENCOUNTER — Ambulatory Visit: Admitting: Mental Health

## 2024-10-13 ENCOUNTER — Ambulatory Visit: Admitting: Mental Health

## 2024-10-27 ENCOUNTER — Ambulatory Visit: Admitting: Mental Health

## 2024-11-10 ENCOUNTER — Ambulatory Visit: Admitting: Mental Health

## 2024-11-24 ENCOUNTER — Ambulatory Visit: Admitting: Mental Health
